# Patient Record
Sex: Male | Born: 1945
Health system: Southern US, Community
[De-identification: ages and names within clinical notes are randomized; demographics above are authoritative.]

## PROBLEM LIST (undated history)

## (undated) DIAGNOSIS — I209 Angina pectoris, unspecified: Secondary | ICD-10-CM

## (undated) DIAGNOSIS — Z87891 Personal history of nicotine dependence: Secondary | ICD-10-CM

## (undated) DIAGNOSIS — M1990 Unspecified osteoarthritis, unspecified site: Secondary | ICD-10-CM

## (undated) DIAGNOSIS — I10 Essential (primary) hypertension: Secondary | ICD-10-CM

## (undated) DIAGNOSIS — R351 Nocturia: Secondary | ICD-10-CM

## (undated) DIAGNOSIS — E7800 Pure hypercholesterolemia, unspecified: Secondary | ICD-10-CM

## (undated) DIAGNOSIS — C801 Malignant (primary) neoplasm, unspecified: Secondary | ICD-10-CM

## (undated) DIAGNOSIS — R3912 Poor urinary stream: Secondary | ICD-10-CM

## (undated) DIAGNOSIS — R0600 Dyspnea, unspecified: Secondary | ICD-10-CM

## (undated) DIAGNOSIS — D696 Thrombocytopenia, unspecified: Secondary | ICD-10-CM

## (undated) DIAGNOSIS — I2510 Atherosclerotic heart disease of native coronary artery without angina pectoris: Secondary | ICD-10-CM

## (undated) DIAGNOSIS — I219 Acute myocardial infarction, unspecified: Secondary | ICD-10-CM

## (undated) DIAGNOSIS — I350 Nonrheumatic aortic (valve) stenosis: Secondary | ICD-10-CM

## (undated) HISTORY — PX: NO PAST SURGERIES: SHX2092

## (undated) HISTORY — DX: Thrombocytopenia, unspecified: D69.6

## (undated) HISTORY — DX: Personal history of nicotine dependence: Z87.891

## (undated) HISTORY — DX: Atherosclerotic heart disease of native coronary artery without angina pectoris: I25.10

## (undated) HISTORY — DX: Nocturia: R35.1

## (undated) HISTORY — DX: Nonrheumatic aortic (valve) stenosis: I35.0

## (undated) HISTORY — DX: Poor urinary stream: R39.12

## (undated) HISTORY — DX: Essential (primary) hypertension: I10

---

## 2011-09-10 ENCOUNTER — Telehealth: Payer: Self-pay

## 2011-09-10 NOTE — Telephone Encounter (Signed)
Called, many rings and no answer.

## 2011-09-12 NOTE — Telephone Encounter (Signed)
Letter mailed for pt to call.  

## 2011-10-04 ENCOUNTER — Other Ambulatory Visit: Payer: Self-pay

## 2011-10-04 ENCOUNTER — Telehealth: Payer: Self-pay

## 2011-10-04 DIAGNOSIS — Z139 Encounter for screening, unspecified: Secondary | ICD-10-CM

## 2011-10-04 NOTE — Telephone Encounter (Addendum)
Gastroenterology Pre-Procedure Form   Request Date: 10/04/2011      Requesting Physician: Dr. Sherwood Gambler      PATIENT INFORMATION:  Jerry Roach is a 65 y.o., male (DOB=[birth year]).  PROCEDURE: Procedure(s) requested: colonoscopy Procedure Reason: screening for colon cancer  PATIENT REVIEW QUESTIONS: The patient reports the following:   1. Diabetes Melitis: no 2. Joint replacements in the past 12 months: no 3. Major health problems in the past 3 months: no 4. Has an artificial valve or MVP:no 5. Has been advised in past to take antibiotics in advance of a procedure like teeth cleaning: no}    MEDICATIONS & ALLERGIES:    Patient reports the following regarding taking any blood thinners:   Plavix? no Aspirin?no Coumadin?  no  Patient confirms/reports the following medications:  Current Outpatient Prescriptions  Medication Sig Dispense Refill  . fish oil-omega-3 fatty acids 1000 MG capsule Take 1 g by mouth daily.        . NON FORMULARY OTC sinus tablet as needed         Patient confirms/reports the following allergies:  No Known Allergies  Patient is appropriate to schedule for requested procedure(s): yes  AUTHORIZATION INFORMATION Primary Insurance:   ID #:   Group #:  Pre-Cert / Auth required:  Pre-Cert / Auth #:   Secondary Insurance:   ID #:   Group #:  Pre-Cert / Auth required:  Pre-Cert / Auth #:   No orders of the defined types were placed in this encounter.    SCHEDULE INFORMATION: Procedure has been scheduled as follows:  Date: 11/05/2011      Time: 7:30 AM  Location: [facility] Short Stay  This Gastroenterology Pre-Precedure Form is being routed to the following provider(s) for review: R. Roetta Sessions, MD    Rx and instructions mailed.

## 2011-10-05 NOTE — Telephone Encounter (Signed)
OK as is.

## 2011-10-22 ENCOUNTER — Encounter (HOSPITAL_COMMUNITY): Payer: Self-pay | Admitting: Pharmacy Technician

## 2011-11-02 MED ORDER — SODIUM CHLORIDE 0.45 % IV SOLN
Freq: Once | INTRAVENOUS | Status: AC
  Administered 2011-11-05: 07:00:00 via INTRAVENOUS

## 2011-11-05 ENCOUNTER — Encounter (HOSPITAL_COMMUNITY): Payer: Self-pay | Admitting: *Deleted

## 2011-11-05 ENCOUNTER — Other Ambulatory Visit: Payer: Self-pay | Admitting: Internal Medicine

## 2011-11-05 ENCOUNTER — Encounter (HOSPITAL_COMMUNITY)
Admission: RE | Disposition: A | Discharge status: Home / Self Care | Payer: Self-pay | Source: Ambulatory Visit | Attending: Internal Medicine

## 2011-11-05 ENCOUNTER — Ambulatory Visit (HOSPITAL_COMMUNITY)
Admission: RE | Admit: 2011-11-05 | Discharge: 2011-11-05 | Disposition: A | Discharge status: Home / Self Care | Payer: Federal, State, Local not specified - PPO | Source: Ambulatory Visit | Attending: Internal Medicine | Admitting: Internal Medicine

## 2011-11-05 DIAGNOSIS — Z1211 Encounter for screening for malignant neoplasm of colon: Secondary | ICD-10-CM

## 2011-11-05 DIAGNOSIS — D126 Benign neoplasm of colon, unspecified: Secondary | ICD-10-CM | POA: Insufficient documentation

## 2011-11-05 DIAGNOSIS — Z139 Encounter for screening, unspecified: Secondary | ICD-10-CM

## 2011-11-05 HISTORY — PX: COLONOSCOPY: SHX5424

## 2011-11-05 SURGERY — COLONOSCOPY
Anesthesia: Moderate Sedation

## 2011-11-05 MED ORDER — MEPERIDINE HCL 100 MG/ML IJ SOLN
INTRAMUSCULAR | Status: AC
  Filled 2011-11-05: qty 2

## 2011-11-05 MED ORDER — MIDAZOLAM HCL 5 MG/5ML IJ SOLN
INTRAMUSCULAR | Status: AC
  Filled 2011-11-05: qty 10

## 2011-11-05 MED ORDER — STERILE WATER FOR IRRIGATION IR SOLN
Status: DC | PRN
  Administered 2011-11-05: 08:00:00

## 2011-11-05 MED ORDER — MIDAZOLAM HCL 5 MG/5ML IJ SOLN
INTRAMUSCULAR | Status: DC | PRN
  Administered 2011-11-05: 1 mg via INTRAVENOUS
  Administered 2011-11-05: 2 mg via INTRAVENOUS

## 2011-11-05 MED ORDER — MEPERIDINE HCL 100 MG/ML IJ SOLN
INTRAMUSCULAR | Status: DC | PRN
  Administered 2011-11-05: 50 mg via INTRAVENOUS
  Administered 2011-11-05: 25 mg via INTRAVENOUS

## 2011-11-05 NOTE — H&P (Signed)
  Primary Care Physician: Dr. Sherwood Gambler Primary Gastroenterologist:  Dr. Jena Gauss  Pre-Procedure History & Physical: HPI:  Jerry Roach is a 66 y.o. male is here for a screening colonoscopy. First ever colonoscopy. No bowel symptoms. No family history of colon cancer colon polyps.  Past Medical History  Diagnosis Date  . No pertinent past medical history     Past Surgical History  Procedure Date  . No past surgeries     Prior to Admission medications   Medication Sig Start Date End Date Taking? Authorizing Provider  chlorpheniramine-pseudoephedrine-acetaminophen (SINUTAB) 2-30-500 MG per tablet Take 1 tablet by mouth every 4 (four) hours as needed. sinus   Yes Historical Provider, MD  fish oil-omega-3 fatty acids 1000 MG capsule Take 1 g by mouth daily.     Yes Historical Provider, MD    Allergies as of 10/04/2011  . (No Known Allergies)    Family History  Problem Relation Age of Onset  . Colon cancer Neg Hx     History   Social History  . Marital Status: Married    Spouse Name: N/A    Number of Children: N/A  . Years of Education: N/A   Occupational History  . Not on file.   Social History Main Topics  . Smoking status: Current Everyday Smoker -- 1.5 packs/day for 39 years  . Smokeless tobacco: Not on file  . Alcohol Use: No  . Drug Use: No  . Sexually Active:    Other Topics Concern  . Not on file   Social History Narrative  . No narrative on file    Review of Systems: See HPI, otherwise negative ROS  Physical Exam: BP 137/75  Pulse 82  Temp(Src) 97.8 F (36.6 C) (Oral)  Resp 20  Ht 5\' 11"  (1.803 m)  Wt 225 lb (102.059 kg)  BMI 31.38 kg/m2  SpO2 98% General:   Alert,  Well-developed, well-nourished, pleasant and cooperative in NAD Head:  Normocephalic and atraumatic. Eyes:  Sclera clear, no icterus.   Conjunctiva pink. Ears:  Normal auditory acuity. Nose:  No deformity, discharge,  or lesions. Mouth:  No deformity or lesions, dentition  normal. Neck:  Supple; no masses or thyromegaly. Lungs:  Clear throughout to auscultation.   No wheezes, crackles, or rhonchi. No acute distress. Heart:  Regular rate and rhythm; no murmurs, clicks, rubs,  or gallops. Abdomen:  Soft, nontender and nondistended. No masses, hepatosplenomegaly or hernias noted. Normal bowel sounds, without guarding, and without rebound.   Msk:  Symmetrical without gross deformities. Normal posture. Pulses:  Normal pulses noted. Extremities:  Without clubbing or edema. Neurologic:  Alert and  oriented x4;  grossly normal neurologically. Skin:  Intact without significant lesions or rashes. Cervical Nodes:  No significant cervical adenopathy. Psych:  Alert and cooperative. Normal mood and affect.  Impression/Plan: Jerry Roach is now here to undergo a screening colonoscopy.  First-ever average risk screening examination.  Risks, benefits, limitations, imponderables and alternatives regarding colonoscopy have been reviewed with the patient. Questions have been answered. All parties agreeable.

## 2011-11-05 NOTE — Op Note (Signed)
[facility] [address] [place], [place]  [address]  COLONOSCOPY PROCEDURE REPORT  PATIENT:  Jerry Roach, Jerry Roach  MR#:  [id number] BIRTHDATE:  1946/04/11, 65 yrs. old  GENDER:  male ENDOSCOPIST:  R. Roetta Sessions, MD FACP [facility] REF. BY:  Artis Delay, M.D. PROCEDURE DATE:  11/05/2011 PROCEDURE:  Colonoscopy with multiple snare polypectomies  INDICATIONS:  First-ever average risk screening examination  INFORMED CONSENT:  The risks, benefits, alternatives and imponderables including but not limited to bleeding, perforation as well as the possibility of a missed lesion have been reviewed. The potential for biopsy, lesion removal, etc. have also been discussed.  Questions have been answered.  All parties agreeable. Please see the history and physical in the medical record for more information.  MEDICATIONS:  Versed 3 mg IV and Demerol 75 mg IV in divided doses.  DESCRIPTION OF PROCEDURE:  After a digital rectal exam was performed, the EC-[id number] ([id number]) colonoscope was advanced from the anus through the rectum and colon to the area of the cecum, ileocecal valve and appendiceal orifice.  The cecum was deeply intubated.  These structures were well-seen and photographed for the record.  From the level of the cecum and ileocecal valve, the scope was slowly and cautiously withdrawn.  The mucosal surfaces were carefully surveyed utilizing scope tip deflection to facilitate fold flattening as needed.  The scope was pulled down into the rectum where a thorough examination including retroflexion was performed. <<PROCEDUREIMAGES>>  FINDINGS: Good preparation.  Multiple colonic polyps in the ascending, transverse and descending segments. The largest polyp being approximately 3 mm X 10 mm in the ascending segment.  Normal rectum. Normal distal 10 cm of terminal ileum.  THERAPEUTIC / DIAGNOSTIC MANEUVERS PERFORMED:  Multiple hot and cold snare polypectomies  performed.  COMPLICATIONS:  None  CECAL WITHDRAWAL TIME: 19 minutes  IMPRESSION:  Multiple colonic polyps-removed as described above  RECOMMENDATIONS:  Follow up pathology  ______________________________ R. Roetta Sessions, MD Caleen Essex  CC:  Artis Delay, M.D.  n. eSIGNED:   R. Roetta Sessions at 11/05/2011 08:13 AM  Howell Pringle, [phone]

## 2011-11-09 ENCOUNTER — Encounter: Payer: Self-pay | Admitting: Internal Medicine

## 2011-11-12 ENCOUNTER — Encounter (HOSPITAL_COMMUNITY): Payer: Self-pay | Admitting: Internal Medicine

## 2014-11-09 ENCOUNTER — Encounter: Payer: Self-pay | Admitting: Internal Medicine

## 2015-02-28 NOTE — Patient Instructions (Signed)
Your procedure is scheduled on: 03/03/2015  Report to [facility] at   80    AM.  Call this number if you have problems the morning of surgery: [phone]   Do not eat food or drink liquids :After Midnight.      Take these medicines the morning of surgery with A SIP OF WATER: none   Do not wear jewelry, make-up or nail polish.  Do not wear lotions, powders, or perfumes.   Do not shave 48 hours prior to surgery.  Do not bring valuables to the hospital.  Contacts, dentures or bridgework may not be worn into surgery.  Leave suitcase in the car. After surgery it may be brought to your room.  For patients admitted to the hospital, checkout time is 11:00 AM the day of discharge.   Patients discharged the day of surgery will not be allowed to drive home.  :     Please read over the following fact sheets that you were given: Coughing and Deep Breathing, Surgical Site Infection Prevention, Anesthesia Post-op Instructions and Care and Recovery After Surgery    Cataract A cataract is a clouding of the lens of the eye. When a lens becomes cloudy, vision is reduced based on the degree and nature of the clouding. Many cataracts reduce vision to some degree. Some cataracts make people more near-sighted as they develop. Other cataracts increase glare. Cataracts that are ignored and become worse can sometimes look white. The white color can be seen through the pupil. CAUSES   Aging. However, cataracts may occur at any age, even in newborns.   Certain drugs.   Trauma to the eye.   Certain diseases such as diabetes.   Specific eye diseases such as chronic inflammation inside the eye or a sudden attack of a rare form of glaucoma.   Inherited or acquired medical problems.  SYMPTOMS   Gradual, progressive drop in vision in the affected eye.   Severe, rapid visual loss. This most often happens when trauma is the cause.  DIAGNOSIS  To detect a cataract, an eye doctor examines the lens. Cataracts  are best diagnosed with an exam of the eyes with the pupils enlarged (dilated) by drops.  TREATMENT  For an early cataract, vision may improve by using different eyeglasses or stronger lighting. If that does not help your vision, surgery is the only effective treatment. A cataract needs to be surgically removed when vision loss interferes with your everyday activities, such as driving, reading, or watching TV. A cataract may also have to be removed if it prevents examination or treatment of another eye problem. Surgery removes the cloudy lens and usually replaces it with a substitute lens (intraocular lens, IOL).  At a time when both you and your doctor agree, the cataract will be surgically removed. If you have cataracts in both eyes, only one is usually removed at a time. This allows the operated eye to heal and be out of danger from any possible problems after surgery (such as infection or poor wound healing). In rare cases, a cataract may be doing damage to your eye. In these cases, your caregiver may advise surgical removal right away. The vast majority of people who have cataract surgery have better vision afterward. HOME CARE INSTRUCTIONS  If you are not planning surgery, you may be asked to do the following:  Use different eyeglasses.   Use stronger or brighter lighting.   Ask your eye doctor about reducing your medicine dose or  changing medicines if it is thought that a medicine caused your cataract. Changing medicines does not make the cataract go away on its own.   Become familiar with your surroundings. Poor vision can lead to injury. Avoid bumping into things on the affected side. You are at a higher risk for tripping or falling.   Exercise extreme care when driving or operating machinery.   Wear sunglasses if you are sensitive to bright light or experiencing problems with glare.  SEEK IMMEDIATE MEDICAL CARE IF:   You have a worsening or sudden vision loss.   You notice redness,  swelling, or increasing pain in the eye.   You have a fever.  Document Released: 09/24/2005 Document Revised: 09/13/2011 Document Reviewed: 05/18/2011 [facility] Patient Information 2012 [place].PATIENT INSTRUCTIONS POST-ANESTHESIA  IMMEDIATELY FOLLOWING SURGERY:  Do not drive or operate machinery for the first twenty four hours after surgery.  Do not make any important decisions for twenty four hours after surgery or while taking narcotic pain medications or sedatives.  If you develop intractable nausea and vomiting or a severe headache please notify your doctor immediately.  FOLLOW-UP:  Please make an appointment with your surgeon as instructed. You do not need to follow up with anesthesia unless specifically instructed to do so.  WOUND CARE INSTRUCTIONS (if applicable):  Keep a dry clean dressing on the anesthesia/puncture wound site if there is drainage.  Once the wound has quit draining you may leave it open to air.  Generally you should leave the bandage intact for twenty four hours unless there is drainage.  If the epidural site drains for more than 36-48 hours please call the anesthesia department.  QUESTIONS?:  Please feel free to call your physician or the hospital operator if you have any questions, and they will be happy to assist you.

## 2015-03-01 ENCOUNTER — Encounter (HOSPITAL_COMMUNITY): Payer: Self-pay

## 2015-03-01 ENCOUNTER — Other Ambulatory Visit: Payer: Self-pay

## 2015-03-01 ENCOUNTER — Encounter (HOSPITAL_COMMUNITY)
Admission: RE | Admit: 2015-03-01 | Discharge: 2015-03-01 | Disposition: A | Discharge status: Home / Self Care | Payer: Medicare Other | Source: Ambulatory Visit | Attending: Ophthalmology | Admitting: Ophthalmology

## 2015-03-01 DIAGNOSIS — H269 Unspecified cataract: Secondary | ICD-10-CM | POA: Diagnosis present

## 2015-03-01 DIAGNOSIS — F17210 Nicotine dependence, cigarettes, uncomplicated: Secondary | ICD-10-CM | POA: Diagnosis not present

## 2015-03-01 DIAGNOSIS — J449 Chronic obstructive pulmonary disease, unspecified: Secondary | ICD-10-CM | POA: Diagnosis not present

## 2015-03-01 LAB — BASIC METABOLIC PANEL
ANION GAP: 8 (ref 5–15)
BUN: 14 mg/dL (ref 6–20)
CO2: 27 mmol/L (ref 22–32)
CREATININE: 1.13 mg/dL (ref 0.61–1.24)
Calcium: 9.4 mg/dL (ref 8.9–10.3)
Chloride: 103 mmol/L (ref 101–111)
GFR calc non Af Amer: >60 mL/min (ref >60)
GLUCOSE: 112 mg/dL — AB (ref 65–99)
Potassium: 4.6 mmol/L (ref 3.5–5.1)
SODIUM: 138 mmol/L (ref 135–145)

## 2015-03-01 LAB — CBC
HEMATOCRIT: 43.8 % (ref 39.0–52.0)
Hemoglobin: 15.2 g/dL (ref 13.0–17.0)
MCH: 33 pg (ref 26.0–34.0)
MCHC: 34.7 g/dL (ref 30.0–36.0)
MCV: 95 fL (ref 78.0–100.0)
PLATELETS: 155 10*3/uL (ref 150–400)
RBC: 4.61 MIL/uL (ref 4.22–5.81)
RDW: 12.9 % (ref 11.5–15.5)
WBC: 7.7 10*3/uL (ref 4.0–10.5)

## 2015-03-01 NOTE — Pre-Procedure Instructions (Signed)
Patient given instructions with web site and code to sign up for "My Chart"  

## 2015-03-02 MED ORDER — LIDOCAINE HCL 3.5 % OP GEL
OPHTHALMIC | Status: AC
  Filled 2015-03-02: qty 1

## 2015-03-02 MED ORDER — LIDOCAINE HCL (PF) 1 % IJ SOLN
INTRAMUSCULAR | Status: AC
  Filled 2015-03-02: qty 2

## 2015-03-02 MED ORDER — TETRACAINE HCL 0.5 % OP SOLN
OPHTHALMIC | Status: AC
Start: 2015-03-02 — End: 2015-03-02
  Filled 2015-03-02: qty 2

## 2015-03-02 MED ORDER — NEOMYCIN-POLYMYXIN-DEXAMETH 3.5-10000-0.1 OP SUSP
OPHTHALMIC | Status: AC
  Filled 2015-03-02: qty 5

## 2015-03-02 MED ORDER — CYCLOPENTOLATE-PHENYLEPHRINE OP SOLN OPTIME - NO CHARGE
OPHTHALMIC | Status: AC
  Filled 2015-03-02: qty 2

## 2015-03-02 MED ORDER — PHENYLEPHRINE HCL 2.5 % OP SOLN
OPHTHALMIC | Status: AC
  Filled 2015-03-02: qty 15

## 2015-03-03 ENCOUNTER — Ambulatory Visit (HOSPITAL_COMMUNITY)
Admission: RE | Admit: 2015-03-03 | Discharge: 2015-03-03 | Disposition: A | Discharge status: Home / Self Care | Payer: Medicare Other | Source: Ambulatory Visit | Attending: Ophthalmology | Admitting: Ophthalmology

## 2015-03-03 ENCOUNTER — Ambulatory Visit (HOSPITAL_COMMUNITY): Payer: Medicare Other | Admitting: Anesthesiology

## 2015-03-03 ENCOUNTER — Encounter (HOSPITAL_COMMUNITY): Payer: Self-pay | Admitting: *Deleted

## 2015-03-03 ENCOUNTER — Encounter (HOSPITAL_COMMUNITY)
Admission: RE | Disposition: A | Discharge status: Home / Self Care | Payer: Self-pay | Source: Ambulatory Visit | Attending: Ophthalmology

## 2015-03-03 DIAGNOSIS — F17210 Nicotine dependence, cigarettes, uncomplicated: Secondary | ICD-10-CM | POA: Diagnosis not present

## 2015-03-03 DIAGNOSIS — J449 Chronic obstructive pulmonary disease, unspecified: Secondary | ICD-10-CM | POA: Diagnosis not present

## 2015-03-03 DIAGNOSIS — H269 Unspecified cataract: Secondary | ICD-10-CM | POA: Diagnosis not present

## 2015-03-03 HISTORY — PX: CATARACT EXTRACTION W/PHACO: SHX586

## 2015-03-03 SURGERY — PHACOEMULSIFICATION, CATARACT, WITH IOL INSERTION
Anesthesia: Monitor Anesthesia Care | Site: Eye | Laterality: Left

## 2015-03-03 MED ORDER — BSS IO SOLN
INTRAOCULAR | Status: DC | PRN
  Administered 2015-03-03: 15 mL

## 2015-03-03 MED ORDER — MIDAZOLAM HCL 2 MG/2ML IJ SOLN
1.0000 mg | INTRAMUSCULAR | Status: DC | PRN
  Administered 2015-03-03: 2 mg via INTRAVENOUS

## 2015-03-03 MED ORDER — FENTANYL CITRATE (PF) 100 MCG/2ML IJ SOLN
INTRAMUSCULAR | Status: AC
  Filled 2015-03-03: qty 2

## 2015-03-03 MED ORDER — LIDOCAINE HCL 3.5 % OP GEL
1.0000 "application " | Freq: Once | OPHTHALMIC | Status: AC
  Administered 2015-03-03: 1 via OPHTHALMIC

## 2015-03-03 MED ORDER — TETRACAINE HCL 0.5 % OP SOLN
1.0000 [drp] | OPHTHALMIC | Status: AC
  Administered 2015-03-03 (×3): 1 [drp] via OPHTHALMIC

## 2015-03-03 MED ORDER — CYCLOPENTOLATE-PHENYLEPHRINE 0.2-1 % OP SOLN
1.0000 [drp] | OPHTHALMIC | Status: AC
  Administered 2015-03-03 (×3): 1 [drp] via OPHTHALMIC

## 2015-03-03 MED ORDER — MIDAZOLAM HCL 2 MG/2ML IJ SOLN
INTRAMUSCULAR | Status: AC
  Filled 2015-03-03: qty 2

## 2015-03-03 MED ORDER — LACTATED RINGERS IV SOLN
INTRAVENOUS | Status: DC
  Administered 2015-03-03: 07:00:00 via INTRAVENOUS

## 2015-03-03 MED ORDER — PHENYLEPHRINE HCL 2.5 % OP SOLN
1.0000 [drp] | OPHTHALMIC | Status: AC
  Administered 2015-03-03 (×3): 1 [drp] via OPHTHALMIC

## 2015-03-03 MED ORDER — LIDOCAINE HCL (PF) 1 % IJ SOLN
INTRAMUSCULAR | Status: DC | PRN
  Administered 2015-03-03: .6 mL

## 2015-03-03 MED ORDER — SODIUM HYALURONATE 23 MG/ML IO SOLN
INTRAOCULAR | Status: DC | PRN
  Administered 2015-03-03: 0.6 mL via INTRAOCULAR

## 2015-03-03 MED ORDER — EPINEPHRINE HCL 1 MG/ML IJ SOLN
INTRAMUSCULAR | Status: AC
  Filled 2015-03-03: qty 1

## 2015-03-03 MED ORDER — POVIDONE-IODINE 5 % OP SOLN
OPHTHALMIC | Status: DC | PRN
  Administered 2015-03-03: 1 via OPHTHALMIC

## 2015-03-03 MED ORDER — EPINEPHRINE HCL 1 MG/ML IJ SOLN
INTRAOCULAR | Status: DC | PRN
  Administered 2015-03-03: 500 mL

## 2015-03-03 MED ORDER — FENTANYL CITRATE (PF) 100 MCG/2ML IJ SOLN
25.0000 ug | INTRAMUSCULAR | Status: AC
  Administered 2015-03-03 (×2): 25 ug via INTRAVENOUS

## 2015-03-03 MED ORDER — LACTATED RINGERS IV SOLN
INTRAVENOUS | Status: DC | PRN
  Administered 2015-03-03: 07:00:00 via INTRAVENOUS

## 2015-03-03 MED ORDER — NEOMYCIN-POLYMYXIN-DEXAMETH 0.1 % OP SUSP
OPHTHALMIC | Status: DC | PRN
  Administered 2015-03-03: 1 [drp] via OPHTHALMIC

## 2015-03-03 MED FILL — Neomycin-Polymyxin-Dexamethasone Ophth Susp 0.1%: OPHTHALMIC | Qty: 5 | Status: AC

## 2015-03-03 SURGICAL SUPPLY — 17 items
CLOTH BEACON ORANGE TIMEOUT ST (SAFETY) ×3 IMPLANT
EYE SHIELD UNIVERSAL CLEAR (GAUZE/BANDAGES/DRESSINGS) ×3 IMPLANT
GLOVE BIOGEL PI IND STRL 6.5 (GLOVE) IMPLANT
GLOVE BIOGEL PI IND STRL 7.0 (GLOVE) ×2 IMPLANT
GLOVE BIOGEL PI IND STRL 7.5 (GLOVE) IMPLANT
GLOVE BIOGEL PI INDICATOR 6.5 (GLOVE)
GLOVE BIOGEL PI INDICATOR 7.0 (GLOVE) ×4
GLOVE BIOGEL PI INDICATOR 7.5 (GLOVE)
GLOVE EXAM NITRILE LRG STRL (GLOVE) ×3 IMPLANT
GLOVE EXAM NITRILE MD LF STRL (GLOVE) IMPLANT
PAD ARMBOARD 7.5X6 YLW CONV (MISCELLANEOUS) ×3 IMPLANT
SIGHTPATH CAT PROC W REG LENS (Ophthalmic Related) ×3 IMPLANT
SYRINGE 1CC 25X5/8 TB ECLIPSE (MISCELLANEOUS) ×3 IMPLANT
TAPE SURG TRANSPORE 1 IN (GAUZE/BANDAGES/DRESSINGS) ×1 IMPLANT
TAPE SURGICAL TRANSPORE 1 IN (GAUZE/BANDAGES/DRESSINGS) ×2
VISCOELASTIC ADDITIONAL (OPHTHALMIC RELATED) ×3 IMPLANT
WATER STERILE IRR 250ML POUR (IV SOLUTION) ×3 IMPLANT

## 2015-03-03 NOTE — Anesthesia Preprocedure Evaluation (Addendum)
Anesthesia Evaluation  Patient identified by MRN, date of birth, ID band Patient awake    Reviewed: Allergy & Precautions, NPO status , Patient's Chart, lab work & pertinent test results  Airway Mallampati: II  TM Distance: >3 FB     Dental  (+) Poor Dentition, Chipped   Pulmonary COPD (probable)Current Smoker,  breath sounds clear to auscultation        Cardiovascular negative cardio ROS  Rhythm:Regular Rate:Normal     Neuro/Psych    GI/Hepatic negative GI ROS,   Endo/Other    Renal/GU      Musculoskeletal   Abdominal   Peds  Hematology   Anesthesia Other Findings   Reproductive/Obstetrics                            Anesthesia Physical Anesthesia Plan  ASA: II  Anesthesia Plan: MAC   Post-op Pain Management:    Induction: Intravenous  Airway Management Planned: Nasal Cannula  Additional Equipment:   Intra-op Plan:   Post-operative Plan:   Informed Consent: I have reviewed the patients History and Physical, chart, labs and discussed the procedure including the risks, benefits and alternatives for the proposed anesthesia with the patient or authorized representative who has indicated his/her understanding and acceptance.     Plan Discussed with:   Anesthesia Plan Comments:         Anesthesia Quick Evaluation

## 2015-03-03 NOTE — Discharge Instructions (Signed)

## 2015-03-03 NOTE — Transfer of Care (Signed)
Immediate Anesthesia Transfer of Care Note  Patient: Jerry Roach  Procedure(s) Performed: Procedure(s) with comments: CATARACT EXTRACTION PHACO AND INTRAOCULAR LENS PLACEMENT (IOC) (Left) - CDE 6.86  Patient Location: Short Stay  Anesthesia Type:MAC  Level of Consciousness: awake, alert , oriented and patient cooperative  Airway & Oxygen Therapy: Patient Spontanous Breathing  Post-op Assessment: Report given to RN and Post -op Vital signs reviewed and stable  Post vital signs: Reviewed and stable  Last Vitals:  Filed Vitals:   03/03/15 0730  BP: 107/64  Pulse:   Temp:   Resp: 26    Complications: No apparent anesthesia complications

## 2015-03-03 NOTE — Anesthesia Postprocedure Evaluation (Signed)
  Anesthesia Post-op Note  Patient: Jerry Roach  Procedure(s) Performed: Procedure(s) with comments: CATARACT EXTRACTION PHACO AND INTRAOCULAR LENS PLACEMENT (IOC) (Left) - CDE 6.86  Patient Location: Short Stay  Anesthesia Type:MAC  Level of Consciousness: awake, alert , oriented and patient cooperative  Airway and Oxygen Therapy: Patient Spontanous Breathing  Post-op Pain: none  Post-op Assessment: Post-op Vital signs reviewed, Patient's Cardiovascular Status Stable, Respiratory Function Stable, Patent Airway, No signs of Nausea or vomiting and Pain level controlled  Post-op Vital Signs: Reviewed and stable  Last Vitals:  Filed Vitals:   03/03/15 0730  BP: 107/64  Pulse:   Temp:   Resp: 26    Complications: No apparent anesthesia complications

## 2015-03-03 NOTE — H&P (Signed)
The H and P was reviewed, the left eye was marked, and all questions answered.  No changes to H and P.

## 2015-03-03 NOTE — Anesthesia Procedure Notes (Signed)
Procedure Name: MAC Date/Time: 03/03/2015 7:31 AM Performed by: Andree Elk, AMY A Pre-anesthesia Checklist: Patient identified, Timeout performed, Emergency Drugs available, Suction available and Patient being monitored

## 2015-03-03 NOTE — Op Note (Signed)
Date of procedure: Mar 02, 2015  Pre-operative diagnosis: 1) Visually significant cataract, Left Eye; 2) Anatomic narrow (occludable) angle, left eye  Post-operative diagnosis: Visually significant cataract, Left Eye; 2) Anatomic narrow (occludable) angle, left eye  Procedure: Removal of cataract via phacoemulsification and insertion of intra-ocular lens HOYA 250 iSert, +22.0 D into the capsular bag of the Boyd  Attending surgeon: Gerda Diss. Bellarae Lizer, MD, MA  Anesthesia: MAC, Topical   Complications: None  Estimated Blood Loss: <70m (minimal)  Specimens: None  Implants: As above  Indications:  Visually significant cataract, Left Eye  Procedure:  The patient was seen and identified in the pre-operative area. The operative eye was identified and dilated.  The operative eye was marked.  Topical 0.75% Bupivicaine was administered to the operative eye.     The patient was then to the operative suite and placed in the supine position.  A timeout was performed confirming the patient, procedure to be performed, and all other relevant information.   The patient's face was prepped and draped in the usual fashion for intra-ocular surgery.  A lid speculum was placed into the operative eye and the surgical microscope moved into place and focused.  A superotemporal paracentesis was created using a 20-gauge MVR blade.  Shugarcaine was injected into the anterior chamber.  Viscoelastic was injected into the anterior chamber.  A temporal clear-corneal main wound incision was created using a 2.463mmicrokeratome.  A continuous curvilinear capsulorrhexis was initiated using an irrigating cystitome and completed using capsulorrhexis forceps.  Hydrodissection and hydrodeliniation were performed.  Viscoelastic was injected into the anterior chamber.  A phacoemulsification handpiece and a chopper as a second instrument were used to remove the nucleus and epinucleus. The irrigation/aspiration handpiece was used to  remove any remaining cortical material.   The capsular bag was reinflated with viscoelastic, checked, and found to be intact. A  Hoya 250 intraocular lens with a +22.0D power was inserted into the capsular bag and dialed into place using a Sinskey hook.  The irrigation/aspiration handpiece was used to remove any remaining viscoelastic. Miochol was injected into the eye.  The clear corneal wound and paracentesis wounds were then hydrated and checked with Weck-Cels to be watertight.  The lid-speculum and drape was removed, and the patient's face was cleaned with a wet and dry 4x4.  Vigamox was instilled in the eye before a clear shield was taped over the eye. The patient was taken to the post-operative care unit in good condition, having tolerated the procedure well.  Post-Op Instructions: [facility] and will receive all other orders and instructions.

## 2015-03-04 ENCOUNTER — Encounter (HOSPITAL_COMMUNITY): Payer: Self-pay | Admitting: Ophthalmology

## 2015-03-29 ENCOUNTER — Other Ambulatory Visit (HOSPITAL_COMMUNITY): Payer: Medicare Other

## 2015-04-18 ENCOUNTER — Encounter (HOSPITAL_COMMUNITY): Admission: RE | Payer: Self-pay | Source: Ambulatory Visit

## 2015-04-18 ENCOUNTER — Ambulatory Visit (HOSPITAL_COMMUNITY): Admission: RE | Admit: 2015-04-18 | Payer: Medicare Other | Source: Ambulatory Visit | Admitting: Ophthalmology

## 2015-04-18 SURGERY — PHACOEMULSIFICATION, CATARACT, WITH IOL INSERTION
Anesthesia: Monitor Anesthesia Care | Laterality: Right

## 2015-08-24 NOTE — Patient Instructions (Signed)
GUTHRIE LEMME  08/24/2015     '@PREFPERIOPPHARMACY'$ @   Your procedure is scheduled on 08/31/2015.  Report to [facility] at 6:15 A.M.  Call this number if you have problems the morning of surgery:  [phone]   Remember:  Do not eat food or drink liquids after midnight.  Take these medicines the morning of surgery with A SIP OF WATER [place]   Do not wear jewelry, make-up or nail polish.  Do not wear lotions, powders, or perfumes.  You may wear deodorant.  Do not shave 48 hours prior to surgery.  Men may shave face and neck.  Do not bring valuables to the hospital.  [facility] is not responsible for any belongings or valuables.  Contacts, dentures or bridgework may not be worn into surgery.  Leave your suitcase in the car.  After surgery it may be brought to your room.  For patients admitted to the hospital, discharge time will be determined by your treatment team.  Patients discharged the day of surgery will not be allowed to drive home.    Please read over the following fact sheets that you were given. Anesthesia Post-op Instructions     PATIENT INSTRUCTIONS POST-ANESTHESIA  IMMEDIATELY FOLLOWING SURGERY:  Do not drive or operate machinery for the first twenty four hours after surgery.  Do not make any important decisions for twenty four hours after surgery or while taking narcotic pain medications or sedatives.  If you develop intractable nausea and vomiting or a severe headache please notify your doctor immediately.  FOLLOW-UP:  Please make an appointment with your surgeon as instructed. You do not need to follow up with anesthesia unless specifically instructed to do so.  WOUND CARE INSTRUCTIONS (if applicable):  Keep a dry clean dressing on the anesthesia/puncture wound site if there is drainage.  Once the wound has quit draining you may leave it open to air.  Generally you should leave the bandage intact for twenty four hours unless there is drainage.  If the  epidural site drains for more than 36-48 hours please call the anesthesia department.  QUESTIONS?:  Please feel free to call your physician or the hospital operator if you have any questions, and they will be happy to assist you.       A cataract is a clouding of the lens of the eye. When a lens becomes cloudy, vision is reduced based on the degree and nature of the clouding. Surgery may be needed to improve vision. Surgery removes the cloudy lens and usually replaces it with a substitute lens (intraocular lens, IOL). LET YOUR EYE DOCTOR KNOW ABOUT:  Allergies to food or medicine.  Medicines taken including herbs, eye drops, over-the-counter medicines, and creams.  Use of steroids (by mouth or creams).  Previous problems with anesthetics or numbing medicine.  History of bleeding problems or blood clots.  Previous surgery.  Other health problems, including diabetes and kidney problems.  Possibility of pregnancy, if this applies. RISKS AND COMPLICATIONS  Infection.  Inflammation of the eyeball (endophthalmitis) that can spread to both eyes (sympathetic ophthalmia).  Poor wound healing.  If an IOL is inserted, it can later fall out of proper position. This is very uncommon.  Clouding of the part of your eye that holds an IOL in place. This is called an "after-cataract." These are uncommon but easily treated. BEFORE THE PROCEDURE  Do not eat or drink anything except small amounts of water for 8 to 12 before your surgery, or  as directed by your caregiver.  Unless you are told otherwise, continue any eye drops you have been prescribed.  Talk to your primary caregiver about all other medicines that you take (both prescription and nonprescription). In some cases, you may need to stop or change medicines near the time of your surgery. This is most important if you are taking blood-thinning medicine.Do not stop medicines unless you are told to do so.  Arrange for someone to drive  you to and from the procedure.  Do not put contact lenses in either eye on the day of your surgery. PROCEDURE There is more than one method for safely removing a cataract. Your doctor can explain the differences and help determine which is best for you. Phacoemulsification surgery is the most common form of cataract surgery.  An injection is given behind the eye or eye drops are given to make this a painless procedure.  A small cut (incision) is made on the edge of the clear, dome-shaped surface that covers the front of the eye (cornea).  A tiny probe is painlessly inserted into the eye. This device gives off ultrasound waves that soften and break up the cloudy center of the lens. This makes it easier for the cloudy lens to be removed by suction.  An IOL may be implanted.  The normal lens of the eye is covered by a clear capsule. Part of that capsule is intentionally left in the eye to support the IOL.  Your surgeon may or may not use stitches to close the incision. There are other forms of cataract surgery that require a larger incision and stitches to close the eye. This approach is taken in cases where the doctor feels that the cataract cannot be easily removed using phacoemulsification. AFTER THE PROCEDURE  When an IOL is implanted, it does not need care. It becomes a permanent part of your eye and cannot be seen or felt.  Your doctor will schedule follow-up exams to check on your progress.  Review your other medicines with your doctor to see which can be resumed after surgery.  Use eye drops or take medicine as prescribed by your doctor.   This information is not intended to replace advice given to you by your health care provider. Make sure you discuss any questions you have with your health care provider.   Document Released: 09/13/2011 Document Revised: 10/15/2014 Document Reviewed: 09/13/2011 Elsevier Interactive Patient Education [employer].

## 2015-08-25 ENCOUNTER — Encounter (HOSPITAL_COMMUNITY): Payer: Self-pay

## 2015-08-25 ENCOUNTER — Encounter (HOSPITAL_COMMUNITY)
Admission: RE | Admit: 2015-08-25 | Discharge: 2015-08-25 | Disposition: A | Discharge status: Home / Self Care | Payer: Medicare Other | Source: Ambulatory Visit | Attending: Ophthalmology | Admitting: Ophthalmology

## 2015-08-25 DIAGNOSIS — Z01818 Encounter for other preprocedural examination: Secondary | ICD-10-CM | POA: Diagnosis present

## 2015-08-25 DIAGNOSIS — H2511 Age-related nuclear cataract, right eye: Secondary | ICD-10-CM | POA: Insufficient documentation

## 2015-08-25 LAB — CBC
HCT: 46 % (ref 39.0–52.0)
HEMOGLOBIN: 15.8 g/dL (ref 13.0–17.0)
MCH: 32.7 pg (ref 26.0–34.0)
MCHC: 34.3 g/dL (ref 30.0–36.0)
MCV: 95.2 fL (ref 78.0–100.0)
Platelets: 147 10*3/uL — ABNORMAL LOW (ref 150–400)
RBC: 4.83 MIL/uL (ref 4.22–5.81)
RDW: 12.5 % (ref 11.5–15.5)
WBC: 7.9 10*3/uL (ref 4.0–10.5)

## 2015-08-25 LAB — BASIC METABOLIC PANEL
Anion gap: 5 (ref 5–15)
BUN: 20 mg/dL (ref 6–20)
CALCIUM: 9.4 mg/dL (ref 8.9–10.3)
CO2: 27 mmol/L (ref 22–32)
CREATININE: 1.18 mg/dL (ref 0.61–1.24)
Chloride: 104 mmol/L (ref 101–111)
Glucose, Bld: 100 mg/dL — ABNORMAL HIGH (ref 65–99)
Potassium: 4.6 mmol/L (ref 3.5–5.1)
SODIUM: 136 mmol/L (ref 135–145)

## 2015-08-25 NOTE — Pre-Procedure Instructions (Signed)
Patient given information to sign up for my chart at home. 

## 2015-08-30 MED ORDER — TETRACAINE HCL 0.5 % OP SOLN
OPHTHALMIC | Status: AC
  Filled 2015-08-30: qty 2

## 2015-08-30 MED ORDER — LIDOCAINE HCL (PF) 1 % IJ SOLN
INTRAMUSCULAR | Status: AC
  Filled 2015-08-30: qty 2

## 2015-08-30 MED ORDER — CYCLOPENTOLATE-PHENYLEPHRINE OP SOLN OPTIME - NO CHARGE
OPHTHALMIC | Status: AC
  Filled 2015-08-30: qty 2

## 2015-08-30 MED ORDER — LIDOCAINE HCL 3.5 % OP GEL
OPHTHALMIC | Status: AC
  Filled 2015-08-30: qty 1

## 2015-08-30 MED ORDER — PHENYLEPHRINE HCL 2.5 % OP SOLN
OPHTHALMIC | Status: AC
  Filled 2015-08-30: qty 15

## 2015-08-30 MED ORDER — NEOMYCIN-POLYMYXIN-DEXAMETH 3.5-10000-0.1 OP SUSP
OPHTHALMIC | Status: AC
  Filled 2015-08-30: qty 5

## 2015-08-31 ENCOUNTER — Ambulatory Visit (HOSPITAL_COMMUNITY)
Admission: RE | Admit: 2015-08-31 | Discharge: 2015-08-31 | Disposition: A | Discharge status: Home / Self Care | Payer: Medicare Other | Source: Ambulatory Visit | Attending: Ophthalmology | Admitting: Ophthalmology

## 2015-08-31 ENCOUNTER — Encounter (HOSPITAL_COMMUNITY): Payer: Self-pay | Admitting: *Deleted

## 2015-08-31 ENCOUNTER — Encounter (HOSPITAL_COMMUNITY)
Admission: RE | Disposition: A | Discharge status: Home / Self Care | Payer: Self-pay | Source: Ambulatory Visit | Attending: Ophthalmology

## 2015-08-31 ENCOUNTER — Ambulatory Visit (HOSPITAL_COMMUNITY): Payer: Medicare Other | Admitting: Anesthesiology

## 2015-08-31 DIAGNOSIS — F17210 Nicotine dependence, cigarettes, uncomplicated: Secondary | ICD-10-CM | POA: Insufficient documentation

## 2015-08-31 DIAGNOSIS — H268 Other specified cataract: Secondary | ICD-10-CM | POA: Diagnosis not present

## 2015-08-31 DIAGNOSIS — H2511 Age-related nuclear cataract, right eye: Secondary | ICD-10-CM | POA: Diagnosis present

## 2015-08-31 HISTORY — PX: CATARACT EXTRACTION W/PHACO: SHX586

## 2015-08-31 SURGERY — PHACOEMULSIFICATION, CATARACT, WITH IOL INSERTION
Anesthesia: Monitor Anesthesia Care | Laterality: Right

## 2015-08-31 MED ORDER — LIDOCAINE HCL (PF) 1 % IJ SOLN
INTRAMUSCULAR | Status: DC | PRN
  Administered 2015-08-31: .5 mL

## 2015-08-31 MED ORDER — FENTANYL CITRATE (PF) 100 MCG/2ML IJ SOLN
INTRAMUSCULAR | Status: AC
  Filled 2015-08-31: qty 2

## 2015-08-31 MED ORDER — PHENYLEPHRINE HCL 2.5 % OP SOLN
1.0000 [drp] | OPHTHALMIC | Status: AC
  Administered 2015-08-31 (×3): 1 [drp] via OPHTHALMIC

## 2015-08-31 MED ORDER — FENTANYL CITRATE (PF) 100 MCG/2ML IJ SOLN
25.0000 ug | INTRAMUSCULAR | Status: AC
  Administered 2015-08-31: 25 ug via INTRAVENOUS

## 2015-08-31 MED ORDER — LACTATED RINGERS IV SOLN
INTRAVENOUS | Status: DC
  Administered 2015-08-31: 100 mL via INTRAVENOUS

## 2015-08-31 MED ORDER — LIDOCAINE HCL 3.5 % OP GEL
OPHTHALMIC | Status: DC | PRN
  Administered 2015-08-31: 1 via OPHTHALMIC

## 2015-08-31 MED ORDER — MIDAZOLAM HCL 2 MG/2ML IJ SOLN
INTRAMUSCULAR | Status: AC
  Filled 2015-08-31: qty 2

## 2015-08-31 MED ORDER — CYCLOPENTOLATE-PHENYLEPHRINE 0.2-1 % OP SOLN
1.0000 [drp] | OPHTHALMIC | Status: AC
  Administered 2015-08-31 (×3): 1 [drp] via OPHTHALMIC

## 2015-08-31 MED ORDER — POVIDONE-IODINE 5 % OP SOLN
OPHTHALMIC | Status: DC | PRN
  Administered 2015-08-31: 1 via OPHTHALMIC

## 2015-08-31 MED ORDER — BSS IO SOLN
INTRAOCULAR | Status: DC | PRN
  Administered 2015-08-31: 15 mL

## 2015-08-31 MED ORDER — EPINEPHRINE HCL 1 MG/ML IJ SOLN
INTRAMUSCULAR | Status: AC
  Filled 2015-08-31: qty 1

## 2015-08-31 MED ORDER — NEOMYCIN-POLYMYXIN-DEXAMETH 3.5-10000-0.1 OP SUSP
OPHTHALMIC | Status: DC | PRN
  Administered 2015-08-31: 2 [drp] via OPHTHALMIC

## 2015-08-31 MED ORDER — MIDAZOLAM HCL 2 MG/2ML IJ SOLN
1.0000 mg | INTRAMUSCULAR | Status: DC | PRN
  Administered 2015-08-31: 2 mg via INTRAVENOUS

## 2015-08-31 MED ORDER — SODIUM HYALURONATE 23 MG/ML IO SOLN
INTRAOCULAR | Status: DC | PRN
  Administered 2015-08-31: 0.6 mL via INTRAOCULAR

## 2015-08-31 MED ORDER — PROVISC 10 MG/ML IO SOLN
INTRAOCULAR | Status: DC | PRN
  Administered 2015-08-31: .85 mL via INTRAOCULAR

## 2015-08-31 MED ORDER — TETRACAINE HCL 0.5 % OP SOLN
1.0000 [drp] | OPHTHALMIC | Status: AC
  Administered 2015-08-31 (×3): 1 [drp] via OPHTHALMIC

## 2015-08-31 MED ORDER — EPINEPHRINE HCL 1 MG/ML IJ SOLN
INTRAOCULAR | Status: DC | PRN
  Administered 2015-08-31: 500 mL

## 2015-08-31 MED ORDER — LIDOCAINE HCL 3.5 % OP GEL
1.0000 | Freq: Once | OPHTHALMIC | Status: AC
Start: 2015-08-31 — End: 2015-08-31
  Administered 2015-08-31: 1 via OPHTHALMIC

## 2015-08-31 SURGICAL SUPPLY — 12 items
CLOTH BEACON ORANGE TIMEOUT ST (SAFETY) ×3 IMPLANT
EYE SHIELD UNIVERSAL CLEAR (GAUZE/BANDAGES/DRESSINGS) ×3 IMPLANT
GLOVE BIOGEL PI IND STRL 6.5 (GLOVE) ×1 IMPLANT
GLOVE BIOGEL PI INDICATOR 6.5 (GLOVE) ×2
GLOVE EXAM NITRILE MD LF STRL (GLOVE) ×3 IMPLANT
PAD ARMBOARD 7.5X6 YLW CONV (MISCELLANEOUS) ×3 IMPLANT
SIGHTPATH CAT PROC W REG LENS (Ophthalmic Related) ×3 IMPLANT
SYR TB 1ML LL NO SAFETY (SYRINGE) ×3 IMPLANT
TAPE SURG TRANSPORE 1 IN (GAUZE/BANDAGES/DRESSINGS) ×1 IMPLANT
TAPE SURGICAL TRANSPORE 1 IN (GAUZE/BANDAGES/DRESSINGS) ×2
VISCOELASTIC ADDITIONAL (OPHTHALMIC RELATED) ×3 IMPLANT
WATER STERILE IRR 250ML POUR (IV SOLUTION) ×3 IMPLANT

## 2015-08-31 NOTE — Op Note (Signed)
Date of procedure: August 31, 2015  Pre-operative diagnosis: Visually significant cataract, Right Eye  Post-operative diagnosis: Visually significant cataract, Right Eye  Procedure: Removal of cataract via phacoemulsification and insertion of intra-ocular lens Hoya 250 +22.5D into the capsular bag of the Right Eye  Attending surgeon: Gerda Diss. Kandra Graven, MD, MA  Anesthesia: MAC, Topical, Intracameral  Complications: None  Estimated Blood Loss: <50m (minimal)  Specimens: None  Implants: As above  Indications:  Visually significant cataract, Right Eye  Procedure:  The patient was seen and identified in the pre-operative area. The operative eye was identified and dilated.  The operative eye was marked.  Topical anesthesia was administered to the operative eye.     The patient was then to the operative suite and placed in the supine position.  A timeout was performed confirming the patient, procedure to be performed, and all other relevant information.   The patient's face was prepped and draped in the usual fashion for intra-ocular surgery.  A lid speculum was placed into the operative eye and the surgical microscope moved into place and focused.  A superotemporal paracentesis was created using a 15 degree blade.  Lidocaine was injected into the anterior chamber.  Viscoelastic was injected into the anterior chamber.  A temporal clear-corneal main wound incision was created using a 2.437mmicrokeratome.  A continuous curvilinear capsulorrhexis was initiated using an irrigating cystitome and completed using capsulorrhexis forceps.  Hydrodissection and hydrodeliniation were performed.  Viscoelastic was injected into the anterior chamber.  A phacoemulsification handpiece and a chopper as a second instrument were used to remove the nucleus and epinucleus. The irrigation/aspiration handpiece was used to remove any remaining cortical material.   The capsular bag was reinflated with viscoelastic,  checked, and found to be intact. An Hoya 250  intraocular lens with a +22.5D power was inserted into the capsular bag and dialed into place using a Sinskey hook.  The irrigation/aspiration handpiece was used to remove any remaining viscoelastic. Miochol was injected into the eye.  The clear corneal wound and paracentesis wounds were then hydrated and checked with Weck-Cels to be watertight.  The lid-speculum and drape was removed, and the patient's face was cleaned with a wet and dry 4x4.  Vigamox was instilled in the eye before a clear shield was taped over the eye. The patient was taken to the post-operative care unit in good condition, having tolerated the procedure well.  Post-Op Instructions: [facility] and will receive all other orders and instructions.

## 2015-08-31 NOTE — Discharge Instructions (Signed)
Monitored Anesthesia Care Monitored anesthesia care is an anesthesia service for a medical procedure. Anesthesia is the loss of the ability to feel pain. It is produced by medicines called anesthetics. It may affect a small area of your body (local anesthesia), a large area of your body (regional anesthesia), or your entire body (general anesthesia). The need for monitored anesthesia care depends your procedure, your condition, and the potential need for regional or general anesthesia. It is often provided during procedures where:   General anesthesia may be needed if there are complications. This is because you need special care when you are under general anesthesia.   You will be under local or regional anesthesia. This is so that you are able to have higher levels of anesthesia if needed.   You will receive calming medicines (sedatives). This is especially the case if sedatives are given to put you in a semi-conscious state of relaxation (deep sedation). This is because the amount of sedative needed to produce this state can be hard to predict. Too much of a sedative can produce general anesthesia. Monitored anesthesia care is performed by one or more health care providers who have special training in all types of anesthesia. You will need to meet with these health care providers before your procedure. During this meeting, they will ask you about your medical history. They will also give you instructions to follow. (For example, you will need to stop eating and drinking before your procedure. You may also need to stop or change medicines you are taking.) During your procedure, your health care providers will stay with you. They will:   Watch your condition. This includes watching your blood pressure, breathing, and level of pain.   Diagnose and treat problems that occur.   Give medicines if they are needed. These may include calming medicines (sedatives) and anesthetics.   Make sure you are  comfortable.  Having monitored anesthesia care does not necessarily mean that you will be under anesthesia. It does mean that your health care providers will be able to manage anesthesia if you need it or if it occurs. It also means that you will be able to have a different type of anesthesia than you are having if you need it. When your procedure is complete, your health care providers will continue to watch your condition. They will make sure any medicines wear off before you are allowed to go home.    This information is not intended to replace advice given to you by your health care provider. Make sure you discuss any questions you have with your health care provider.   Document Released: 06/20/2005 Document Revised: 10/15/2014 Document Reviewed: 11/05/2012 Elsevier Interactive Patient Education [employer].

## 2015-08-31 NOTE — H&P (Signed)
The H and P was reviewed and updated.  No changes were found after exam.  The surgical eye was marked.

## 2015-08-31 NOTE — Anesthesia Procedure Notes (Signed)
Procedure Name: MAC Date/Time: 08/31/2015 7:33 AM Performed by: Vista Deck Pre-anesthesia Checklist: Patient identified, Emergency Drugs available, Suction available, Timeout performed and Patient being monitored Patient Re-evaluated:Patient Re-evaluated prior to inductionOxygen Delivery Method: Nasal Cannula

## 2015-08-31 NOTE — Transfer of Care (Signed)
Immediate Anesthesia Transfer of Care Note  Patient: Jerry Roach  Procedure(s) Performed: Procedure(s) (LRB): CATARACT EXTRACTION PHACO AND INTRAOCULAR LENS PLACEMENT RIGHT EYE CDE=4.08 (Right)  Patient Location: Shortstay  Anesthesia Type: MAC  Level of Consciousness: awake  Airway & Oxygen Therapy: Patient Spontanous Breathing   Post-op Assessment: Report given to PACU RN, Post -op Vital signs reviewed and stable and Patient moving all extremities  Post vital signs: Reviewed and stable  Complications: No apparent anesthesia complications

## 2015-08-31 NOTE — Anesthesia Preprocedure Evaluation (Signed)
Anesthesia Evaluation  Patient identified by MRN, date of birth, ID band Patient awake    Reviewed: Allergy & Precautions, NPO status , Patient's Chart, lab work & pertinent test results  Airway Mallampati: II  TM Distance: >3 FB     Dental  (+) Poor Dentition, Chipped   Pulmonary COPD (probable), Current Smoker,    breath sounds clear to auscultation       Cardiovascular negative cardio ROS   Rhythm:Regular Rate:Normal     Neuro/Psych    GI/Hepatic negative GI ROS,   Endo/Other    Renal/GU      Musculoskeletal   Abdominal   Peds  Hematology   Anesthesia Other Findings   Reproductive/Obstetrics                             Anesthesia Physical Anesthesia Plan  ASA: II  Anesthesia Plan: MAC   Post-op Pain Management:    Induction: Intravenous  Airway Management Planned: Nasal Cannula  Additional Equipment:   Intra-op Plan:   Post-operative Plan:   Informed Consent: I have reviewed the patients History and Physical, chart, labs and discussed the procedure including the risks, benefits and alternatives for the proposed anesthesia with the patient or authorized representative who has indicated his/her understanding and acceptance.     Plan Discussed with:   Anesthesia Plan Comments:         Anesthesia Quick Evaluation

## 2015-08-31 NOTE — Anesthesia Postprocedure Evaluation (Signed)
Anesthesia Post Note  Patient: Jerry Roach  Procedure(s) Performed: Procedure(s) (LRB): CATARACT EXTRACTION PHACO AND INTRAOCULAR LENS PLACEMENT RIGHT EYE CDE=4.08 (Right)  Patient location during evaluation: Short Stay Anesthesia Type: MAC Level of consciousness: awake and alert Pain management: pain level controlled Vital Signs Assessment: post-procedure vital signs reviewed and stable Respiratory status: spontaneous breathing Anesthetic complications: no    Last Vitals:  Filed Vitals:   08/31/15 0725 08/31/15 0730  BP: 119/69 126/70  Pulse:    Temp:    Resp: 21 27    Last Pain: There were no vitals filed for this visit.               Drucie Opitz

## 2016-04-26 DIAGNOSIS — C44229 Squamous cell carcinoma of skin of left ear and external auricular canal: Secondary | ICD-10-CM | POA: Diagnosis not present

## 2016-04-26 DIAGNOSIS — X32XXXA Exposure to sunlight, initial encounter: Secondary | ICD-10-CM | POA: Diagnosis not present

## 2016-04-26 DIAGNOSIS — D225 Melanocytic nevi of trunk: Secondary | ICD-10-CM | POA: Diagnosis not present

## 2016-04-26 DIAGNOSIS — L570 Actinic keratosis: Secondary | ICD-10-CM | POA: Diagnosis not present

## 2016-05-28 DIAGNOSIS — L570 Actinic keratosis: Secondary | ICD-10-CM | POA: Diagnosis not present

## 2016-05-28 DIAGNOSIS — Z85828 Personal history of other malignant neoplasm of skin: Secondary | ICD-10-CM | POA: Diagnosis not present

## 2016-05-28 DIAGNOSIS — C44311 Basal cell carcinoma of skin of nose: Secondary | ICD-10-CM | POA: Diagnosis not present

## 2016-05-28 DIAGNOSIS — Z08 Encounter for follow-up examination after completed treatment for malignant neoplasm: Secondary | ICD-10-CM | POA: Diagnosis not present

## 2016-05-28 DIAGNOSIS — X32XXXD Exposure to sunlight, subsequent encounter: Secondary | ICD-10-CM | POA: Diagnosis not present

## 2016-09-24 DIAGNOSIS — H5053 Vertical heterophoria: Secondary | ICD-10-CM | POA: Diagnosis not present

## 2016-09-24 DIAGNOSIS — H401131 Primary open-angle glaucoma, bilateral, mild stage: Secondary | ICD-10-CM | POA: Diagnosis not present

## 2017-01-15 DIAGNOSIS — H40013 Open angle with borderline findings, low risk, bilateral: Secondary | ICD-10-CM | POA: Diagnosis not present

## 2017-01-15 DIAGNOSIS — H40003 Preglaucoma, unspecified, bilateral: Secondary | ICD-10-CM | POA: Diagnosis not present

## 2017-01-15 DIAGNOSIS — H40053 Ocular hypertension, bilateral: Secondary | ICD-10-CM | POA: Diagnosis not present

## 2017-08-14 DIAGNOSIS — E663 Overweight: Secondary | ICD-10-CM | POA: Diagnosis not present

## 2017-08-14 DIAGNOSIS — R3911 Hesitancy of micturition: Secondary | ICD-10-CM | POA: Diagnosis not present

## 2017-08-14 DIAGNOSIS — Z1389 Encounter for screening for other disorder: Secondary | ICD-10-CM | POA: Diagnosis not present

## 2017-08-14 DIAGNOSIS — Z6829 Body mass index (BMI) 29.0-29.9, adult: Secondary | ICD-10-CM | POA: Diagnosis not present

## 2017-08-14 DIAGNOSIS — Z0000 Encounter for general adult medical examination without abnormal findings: Secondary | ICD-10-CM | POA: Diagnosis not present

## 2017-08-19 DIAGNOSIS — H1030 Unspecified acute conjunctivitis, unspecified eye: Secondary | ICD-10-CM | POA: Diagnosis not present

## 2017-08-19 DIAGNOSIS — J209 Acute bronchitis, unspecified: Secondary | ICD-10-CM | POA: Diagnosis not present

## 2017-08-22 DIAGNOSIS — L570 Actinic keratosis: Secondary | ICD-10-CM | POA: Diagnosis not present

## 2017-08-22 DIAGNOSIS — Z85828 Personal history of other malignant neoplasm of skin: Secondary | ICD-10-CM | POA: Diagnosis not present

## 2017-08-22 DIAGNOSIS — X32XXXD Exposure to sunlight, subsequent encounter: Secondary | ICD-10-CM | POA: Diagnosis not present

## 2017-08-22 DIAGNOSIS — D0439 Carcinoma in situ of skin of other parts of face: Secondary | ICD-10-CM | POA: Diagnosis not present

## 2017-08-22 DIAGNOSIS — Z08 Encounter for follow-up examination after completed treatment for malignant neoplasm: Secondary | ICD-10-CM | POA: Diagnosis not present

## 2017-10-03 DIAGNOSIS — Z08 Encounter for follow-up examination after completed treatment for malignant neoplasm: Secondary | ICD-10-CM | POA: Diagnosis not present

## 2017-10-03 DIAGNOSIS — Z85828 Personal history of other malignant neoplasm of skin: Secondary | ICD-10-CM | POA: Diagnosis not present

## 2017-10-30 ENCOUNTER — Ambulatory Visit (INDEPENDENT_AMBULATORY_CARE_PROVIDER_SITE_OTHER): Payer: Medicare Other | Admitting: Urology

## 2017-10-30 DIAGNOSIS — R3915 Urgency of urination: Secondary | ICD-10-CM

## 2017-10-30 DIAGNOSIS — R351 Nocturia: Secondary | ICD-10-CM

## 2017-12-11 ENCOUNTER — Ambulatory Visit (INDEPENDENT_AMBULATORY_CARE_PROVIDER_SITE_OTHER): Payer: Medicare Other | Admitting: Urology

## 2017-12-11 DIAGNOSIS — R3912 Poor urinary stream: Secondary | ICD-10-CM | POA: Diagnosis not present

## 2017-12-11 DIAGNOSIS — R351 Nocturia: Secondary | ICD-10-CM

## 2017-12-26 ENCOUNTER — Inpatient Hospital Stay (HOSPITAL_COMMUNITY)
Admission: EM | Admit: 2017-12-26 | Discharge: 2017-12-29 | DRG: 247 | Disposition: A | Discharge status: Home / Self Care | Payer: Medicare Other | Attending: Cardiology | Admitting: Cardiology

## 2017-12-26 ENCOUNTER — Emergency Department (HOSPITAL_COMMUNITY): Payer: Medicare Other

## 2017-12-26 ENCOUNTER — Other Ambulatory Visit: Payer: Self-pay

## 2017-12-26 ENCOUNTER — Encounter (HOSPITAL_COMMUNITY): Payer: Self-pay | Admitting: Emergency Medicine

## 2017-12-26 DIAGNOSIS — N400 Enlarged prostate without lower urinary tract symptoms: Secondary | ICD-10-CM | POA: Diagnosis present

## 2017-12-26 DIAGNOSIS — F17210 Nicotine dependence, cigarettes, uncomplicated: Secondary | ICD-10-CM | POA: Diagnosis present

## 2017-12-26 DIAGNOSIS — R001 Bradycardia, unspecified: Secondary | ICD-10-CM | POA: Diagnosis present

## 2017-12-26 DIAGNOSIS — I440 Atrioventricular block, first degree: Secondary | ICD-10-CM | POA: Diagnosis present

## 2017-12-26 DIAGNOSIS — I350 Nonrheumatic aortic (valve) stenosis: Secondary | ICD-10-CM | POA: Diagnosis not present

## 2017-12-26 DIAGNOSIS — I959 Hypotension, unspecified: Secondary | ICD-10-CM | POA: Diagnosis present

## 2017-12-26 DIAGNOSIS — R0989 Other specified symptoms and signs involving the circulatory and respiratory systems: Secondary | ICD-10-CM | POA: Diagnosis present

## 2017-12-26 DIAGNOSIS — N182 Chronic kidney disease, stage 2 (mild): Secondary | ICD-10-CM | POA: Diagnosis present

## 2017-12-26 DIAGNOSIS — I351 Nonrheumatic aortic (valve) insufficiency: Secondary | ICD-10-CM | POA: Diagnosis not present

## 2017-12-26 DIAGNOSIS — R011 Cardiac murmur, unspecified: Secondary | ICD-10-CM | POA: Diagnosis not present

## 2017-12-26 DIAGNOSIS — E7800 Pure hypercholesterolemia, unspecified: Secondary | ICD-10-CM | POA: Diagnosis not present

## 2017-12-26 DIAGNOSIS — I2510 Atherosclerotic heart disease of native coronary artery without angina pectoris: Secondary | ICD-10-CM | POA: Diagnosis not present

## 2017-12-26 DIAGNOSIS — E785 Hyperlipidemia, unspecified: Secondary | ICD-10-CM | POA: Diagnosis not present

## 2017-12-26 DIAGNOSIS — I214 Non-ST elevation (NSTEMI) myocardial infarction: Secondary | ICD-10-CM | POA: Diagnosis not present

## 2017-12-26 DIAGNOSIS — R05 Cough: Secondary | ICD-10-CM | POA: Diagnosis not present

## 2017-12-26 DIAGNOSIS — Z720 Tobacco use: Secondary | ICD-10-CM | POA: Diagnosis present

## 2017-12-26 DIAGNOSIS — Z9861 Coronary angioplasty status: Secondary | ICD-10-CM

## 2017-12-26 DIAGNOSIS — R079 Chest pain, unspecified: Secondary | ICD-10-CM | POA: Diagnosis not present

## 2017-12-26 HISTORY — DX: Pure hypercholesterolemia, unspecified: E78.00

## 2017-12-26 LAB — BASIC METABOLIC PANEL
Anion gap: 10 (ref 5–15)
BUN: 23 mg/dL — AB (ref 6–20)
CO2: 26 mmol/L (ref 22–32)
Calcium: 9.4 mg/dL (ref 8.9–10.3)
Chloride: 102 mmol/L (ref 101–111)
Creatinine, Ser: 1.58 mg/dL — ABNORMAL HIGH (ref 0.61–1.24)
GFR calc Af Amer: 49 mL/min — ABNORMAL LOW (ref >60)
GFR calc non Af Amer: 43 mL/min — ABNORMAL LOW (ref >60)
Glucose, Bld: 113 mg/dL — ABNORMAL HIGH (ref 65–99)
Potassium: 3.8 mmol/L (ref 3.5–5.1)
SODIUM: 138 mmol/L (ref 135–145)

## 2017-12-26 LAB — TROPONIN I: Troponin I: 0.36 ng/mL (ref <0.03)

## 2017-12-26 LAB — CBC
HCT: 44.8 % (ref 39.0–52.0)
Hemoglobin: 15 g/dL (ref 13.0–17.0)
MCH: 31.8 pg (ref 26.0–34.0)
MCHC: 33.5 g/dL (ref 30.0–36.0)
MCV: 94.9 fL (ref 78.0–100.0)
Platelets: 148 10*3/uL — ABNORMAL LOW (ref 150–400)
RBC: 4.72 MIL/uL (ref 4.22–5.81)
RDW: 12.9 % (ref 11.5–15.5)
WBC: 7.1 10*3/uL (ref 4.0–10.5)

## 2017-12-26 LAB — APTT: APTT: 29 s (ref 24–36)

## 2017-12-26 LAB — PROTIME-INR
INR: 0.95
PROTHROMBIN TIME: 12.5 s (ref 11.4–15.2)

## 2017-12-26 IMAGING — DX DG CHEST 2V
2 series · 2 of 2 positions shown · non-contrast
Comparison: None.

CLINICAL DATA: Chest pain with productive cough

EXAM:
CHEST - 2 VIEW

[chest pa]
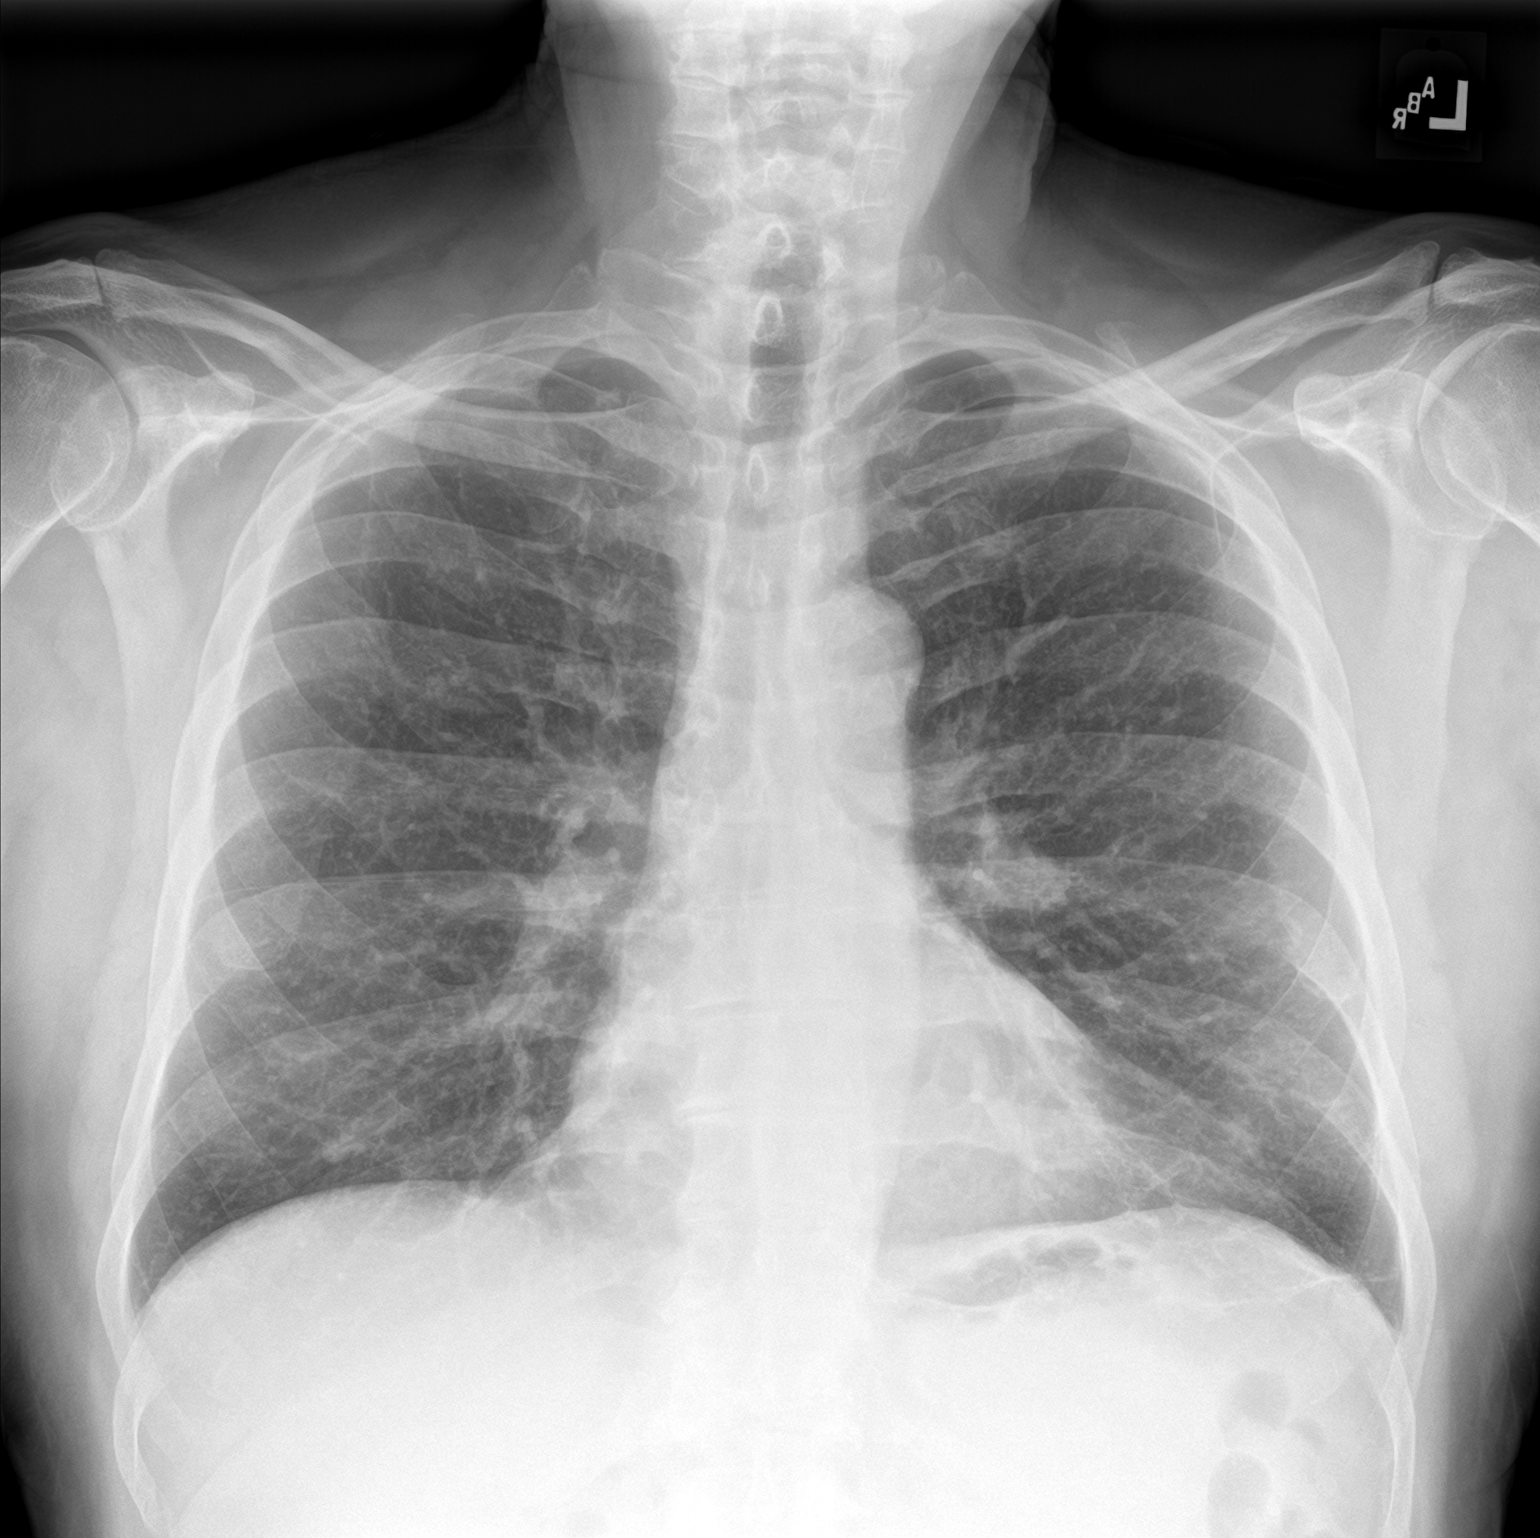

[chest lat]
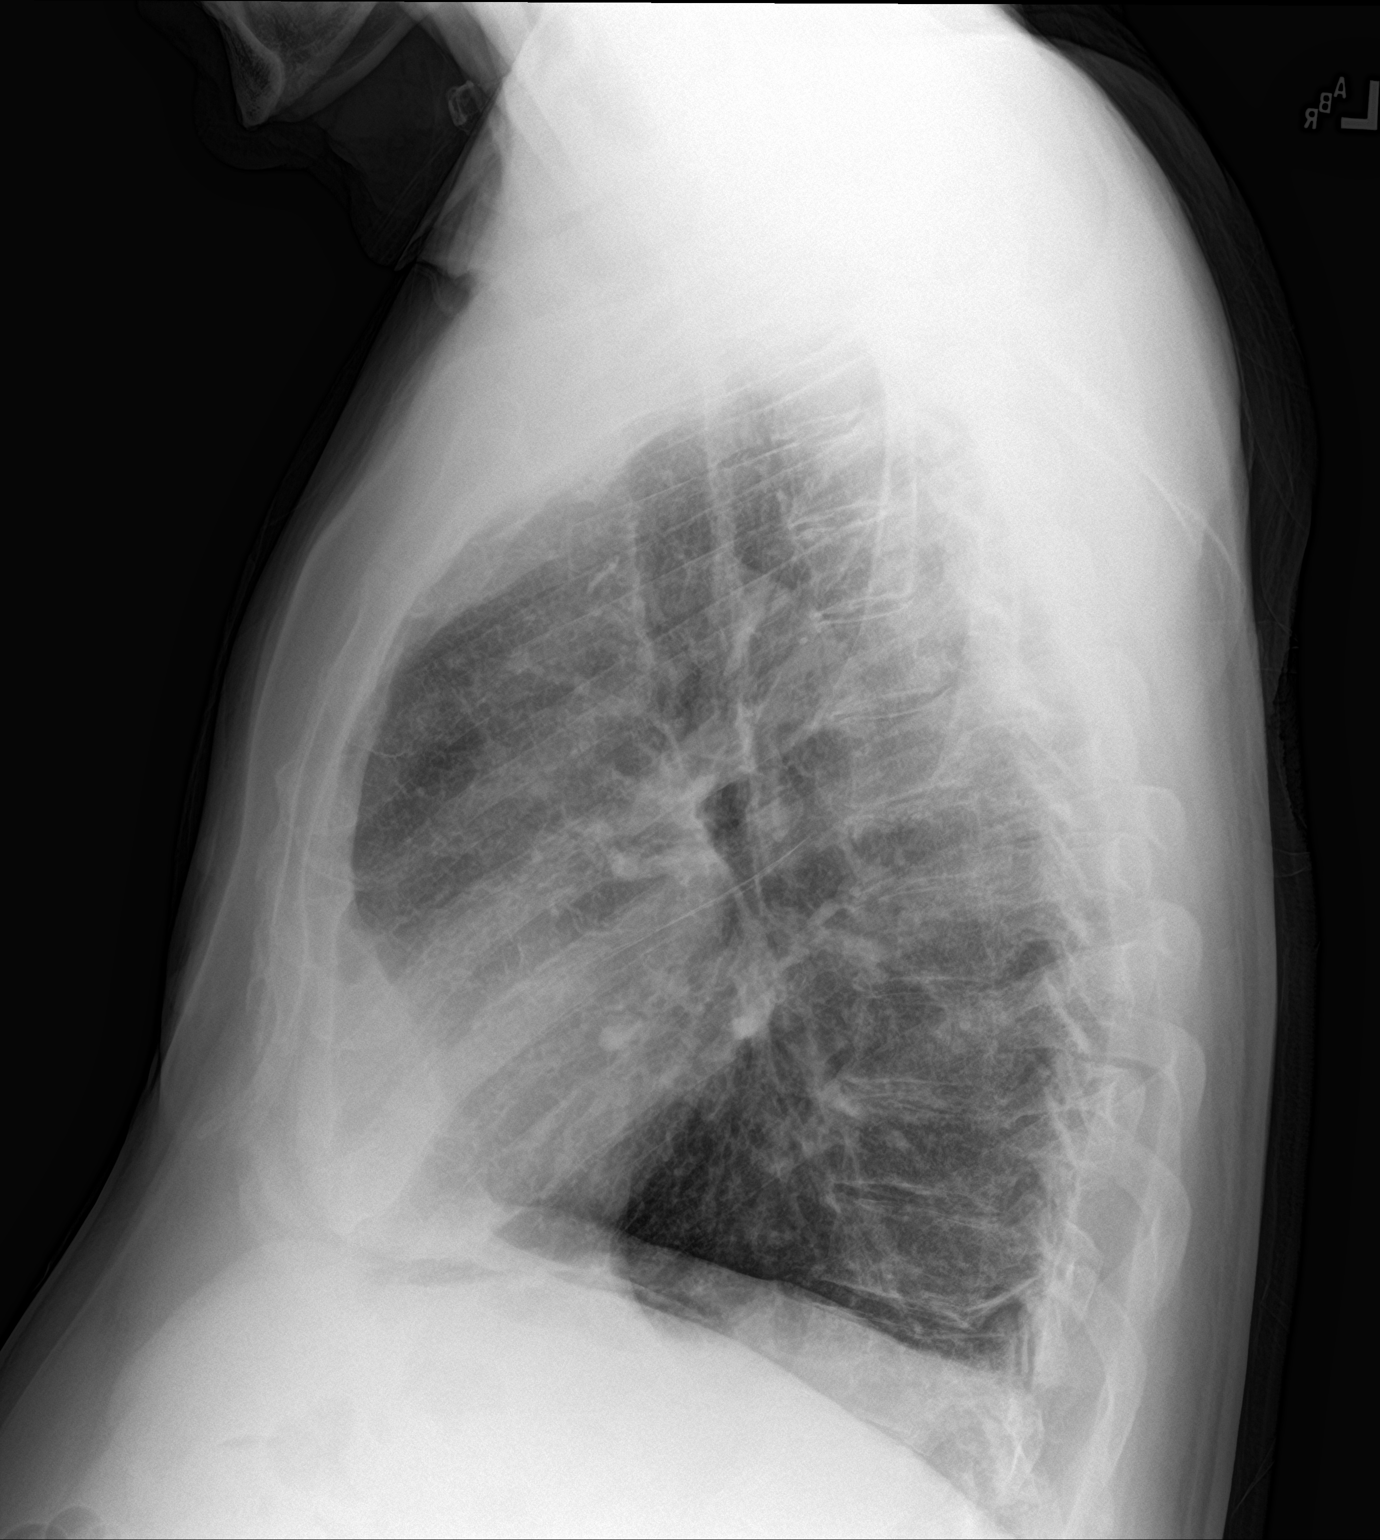

[2 of 2 positions shown; findings below may reference images not displayed]

FINDINGS: Mild diffuse bronchitic changes. No consolidation or effusion.
Normal heart size. No pneumothorax.
IMPRESSION: No active cardiopulmonary disease.  Mild diffuse bronchitic changes.

## 2017-12-26 MED ORDER — ATORVASTATIN CALCIUM 80 MG PO TABS
80.0000 mg | ORAL_TABLET | Freq: Every day | ORAL | Status: DC
  Administered 2017-12-26 – 2017-12-28 (×3): 80 mg via ORAL
  Filled 2017-12-26 (×3): qty 1

## 2017-12-26 MED ORDER — NITROGLYCERIN 0.4 MG SL SUBL
0.4000 mg | SUBLINGUAL_TABLET | SUBLINGUAL | Status: DC | PRN
  Administered 2017-12-26 – 2017-12-27 (×3): 0.4 mg via SUBLINGUAL
  Filled 2017-12-26 (×3): qty 1

## 2017-12-26 MED ORDER — ASPIRIN EC 81 MG PO TBEC
81.0000 mg | DELAYED_RELEASE_TABLET | Freq: Every day | ORAL | Status: DC
  Administered 2017-12-28 – 2017-12-29 (×2): 81 mg via ORAL
  Filled 2017-12-26 (×2): qty 1

## 2017-12-26 MED ORDER — NITROGLYCERIN IN D5W 200-5 MCG/ML-% IV SOLN
5.0000 ug/min | Freq: Once | INTRAVENOUS | Status: AC
  Administered 2017-12-26: 5 ug/min via INTRAVENOUS
  Filled 2017-12-26: qty 250

## 2017-12-26 MED ORDER — ONDANSETRON HCL 4 MG/2ML IJ SOLN
4.0000 mg | Freq: Four times a day (QID) | INTRAMUSCULAR | Status: DC | PRN
  Administered 2017-12-27: 4 mg via INTRAVENOUS
  Filled 2017-12-26: qty 2

## 2017-12-26 MED ORDER — NICOTINE 21 MG/24HR TD PT24
21.0000 mg | MEDICATED_PATCH | Freq: Every day | TRANSDERMAL | Status: DC
  Filled 2017-12-26 (×2): qty 1

## 2017-12-26 MED ORDER — HEPARIN (PORCINE) IN NACL 100-0.45 UNIT/ML-% IJ SOLN
1200.0000 [IU]/h | INTRAMUSCULAR | Status: DC
  Administered 2017-12-26: 1000 [IU]/h via INTRAVENOUS
  Filled 2017-12-26: qty 250

## 2017-12-26 MED ORDER — ACETAMINOPHEN 325 MG PO TABS
650.0000 mg | ORAL_TABLET | ORAL | Status: DC | PRN
  Administered 2017-12-26 – 2017-12-27 (×2): 650 mg via ORAL
  Filled 2017-12-26 (×2): qty 2

## 2017-12-26 MED ORDER — NITROGLYCERIN 0.4 MG SL SUBL
0.4000 mg | SUBLINGUAL_TABLET | SUBLINGUAL | Status: AC | PRN
  Administered 2017-12-26 (×3): 0.4 mg via SUBLINGUAL
  Filled 2017-12-26: qty 1

## 2017-12-26 NOTE — ED Notes (Signed)
EDP at bedside updating patient and family. 

## 2017-12-26 NOTE — ED Notes (Addendum)
Rigo Letts (pt's wife)  [phone]  *Requests to be updated if there is any change is pt's disposition.

## 2017-12-26 NOTE — ED Triage Notes (Addendum)
Pt c/o of sob and chest pain starting an hour ago.  Pt took 4 baby ASA at 1545

## 2017-12-26 NOTE — ED Notes (Signed)
EDP at bedside  

## 2017-12-26 NOTE — ED Notes (Signed)
Called [facility] for transport to [facility]. 

## 2017-12-26 NOTE — ED Notes (Addendum)
Per Doctors orders, Nitro Drip is to be titrated by 48mcg/min until SBP meets parameters, and Pts Pain level at 0 on 0-10 scale. Pt states his pain is just "dull and would barely call it a 1." Pts SBP on lower end of order parameters currently. Cindy from [facility] increased Pt's Nitro Drip above its current infusion rate of 7.57mcg/min. Pt prepared for departure to [facility]. RN advised Jenny Reichmann on the titration orders for Pt and Jenny Reichmann felt it would be fine.

## 2017-12-26 NOTE — ED Notes (Addendum)
Attempted to call pt's wife with number provided. Left message.

## 2017-12-26 NOTE — H&P (Signed)
Cardiology History & Physical    Patient ID: Jerry Roach MRN: [id number], DOB: 1946-07-16 Date of Encounter: 12/26/2017, 10:46 PM Primary Physician: Redmond School, MD  Chief Complaint: Chest pain   HPI: Jerry Roach is a 72 y.o. male with history of active smoking, hyperlipidemia, who presents with chest pain.  The patient has had stuttering chest discomfort since Monday.  This afternoon the chest discomfort was severe and occurred while patient was walking in a very leisurely place.  Initially started in his central chest and then radiated to his throat and bilateral jaws.  He denied any significant associated shortness of breath, nausea/vomiting, diaphoresis, presyncope or syncope.  He presented to the [facility] ED.  Initial ECG showed possible concern for prior inferior infarct but otherwise no acute ST or T wave changes.  His initial troponin was positive at 0.36; remainder of his labs were within normal limits.  He required 3 sublingual nitroglycerin tablets and starting an IV nitroglycerin infusion to relieve his chest pain.  IV heparin drip was also started.  He was transferred to the [facility] ICU for further management.  Upon my interview, he is chest pain-free.  He has never had a history of a prior cardiac catheterization or any other significant cardiac disease previously.  Past Medical History:  Diagnosis Date  . Hypercholesteremia   . No pertinent past medical history      Surgical History:  Past Surgical History:  Procedure Laterality Date  . CATARACT EXTRACTION W/PHACO Left 03/03/2015   Procedure: CATARACT EXTRACTION PHACO AND INTRAOCULAR LENS PLACEMENT (IOC);  Surgeon: Baruch Goldmann, MD;  Location: [facility] ORS;  Service: Ophthalmology;  Laterality: Left;  CDE 6.86  . CATARACT EXTRACTION W/PHACO Right 08/31/2015   Procedure: CATARACT EXTRACTION PHACO AND INTRAOCULAR LENS PLACEMENT RIGHT EYE CDE=4.08;  Surgeon: Baruch Goldmann, MD;  Location: [facility] ORS;  Service: Ophthalmology;   Laterality: Right;  . COLONOSCOPY  11/05/2011   Procedure: COLONOSCOPY;  Surgeon: Daneil Dolin, MD;  Location: [facility] ENDO SUITE;  Service: Endoscopy;  Laterality: N/A;  7:30 AM  . NO PAST SURGERIES       Home Meds: Prior to Admission medications   Medication Sig Start Date End Date Taking? Authorizing Provider  alfuzosin (UROXATRAL) 10 MG 24 hr tablet Take 10 mg by mouth daily.  12/11/17  Yes [provider]  atorvastatin (LIPITOR) 40 MG tablet Take 40 mg by mouth daily.  12/20/17  Yes [provider]    Allergies: No Known Allergies  Social History   Socioeconomic History  . Marital status: Married    Spouse name: Not on file  . Number of children: Not on file  . Years of education: Not on file  . Highest education level: Not on file  Occupational History  . Not on file  Social Needs  . Financial resource strain: Not on file  . Food insecurity:    Worry: Not on file    Inability: Not on file  . Transportation needs:    Medical: Not on file    Non-medical: Not on file  Tobacco Use  . Smoking status: Current Every Day Smoker    Packs/day: 1.50    Years: 39.00    Pack years: 58.50    Types: Cigarettes  . Smokeless tobacco: Never Used  Substance and Sexual Activity  . Alcohol use: No  . Drug use: No  . Sexual activity: Yes    Birth control/protection: None  Lifestyle  . Physical activity:  Days per week: Not on file    Minutes per session: Not on file  . Stress: Not on file  Relationships  . Social connections:    Talks on phone: Not on file    Gets together: Not on file    Attends religious service: Not on file    Active member of club or organization: Not on file    Attends meetings of clubs or organizations: Not on file    Relationship status: Not on file  . Intimate partner violence:    Fear of current or ex partner: Not on file    Emotionally abused: Not on file    Physically abused: Not on file    Forced sexual activity: Not on file    Other Topics Concern  . Not on file  Social History Narrative  . Not on file     Family History  Problem Relation Age of Onset  . Colon cancer Neg Hx     Review of Systems: All other systems reviewed and are otherwise negative except as noted above.  Labs:   Lab Results  Component Value Date   WBC 7.1 12/26/2017   HGB 15.0 12/26/2017   HCT 44.8 12/26/2017   MCV 94.9 12/26/2017   PLT 148 (L) 12/26/2017    Recent Labs  Lab 12/26/17 1634  NA 138  K 3.8  CL 102  CO2 26  BUN 23*  CREATININE 1.58*  CALCIUM 9.4  GLUCOSE 113*   Recent Labs    12/26/17 1634  TROPONINI 0.36*   No results found for: CHOL, HDL, LDLCALC, TRIG No results found for: DDIMER  Radiology/Studies:  Dg Chest 2 View  Result Date: 12/26/2017 CLINICAL DATA:  Chest pain with productive cough EXAM: CHEST - 2 VIEW COMPARISON:  None. FINDINGS: Mild diffuse bronchitic changes. No consolidation or effusion. Normal heart size. No pneumothorax. IMPRESSION: No active cardiopulmonary disease.  Mild diffuse bronchitic changes. Electronically Signed   By: Donavan Foil M.D.   On: 12/26/2017 16:13   Wt Readings from Last 3 Encounters:  12/26/17 94.3 kg (208 lb)  08/25/15 95.3 kg (210 lb)  03/03/15 98.4 kg (217 lb)    EKG: Sinus rhythm, possible prior inferior infarct.  No acute ST or T wave changes.  Physical Exam: Blood pressure (!) 105/59, pulse 66, temperature 98.4 F (36.9 C), temperature source Oral, resp. rate 20, height 5' 10.5" (1.791 m), weight 94.3 kg (208 lb), SpO2 100 %. Body mass index is 29.42 kg/m. General: Well developed, well nourished, in no acute distress. Head: Normocephalic, atraumatic, sclera non-icteric, no xanthomas, nares are without discharge.  Neck: Negative for carotid bruits. JVD not elevated. Lungs: Clear bilaterally to auscultation without wheezes, rales, or rhonchi. Breathing is unlabored. Heart: RRR with S1 S2. No murmurs, rubs, or gallops appreciated. Abdomen: Soft,  non-tender, non-distended with normoactive bowel sounds. No hepatomegaly. No rebound/guarding. No obvious abdominal masses. Msk:  Strength and tone appear normal for age. Extremities: No clubbing or cyanosis. No edema.  Distal pedal pulses are 2+ and equal bilaterally. Neuro: Alert and oriented X 3. No focal deficit. No facial asymmetry. Moves all extremities spontaneously. Psych:  Responds to questions appropriately with a normal affect.    Assessment and Plan   72 year old active smoker with no prior cardiac history, who presents with chest pain and was found to have NSTEMI.  1.  NSTEMI: Currently chest pain-free.  Continue IV heparin drip, and titrate nitroglycerin drip to chest pain-free.  Plan for cardiac catheterization  in the morning or more urgently if he has chest pain refractory to medical management overnight.  Echocardiogram ordered.  Given borderline blood pressure present, will hold off on beta-blockade and ACE inhibition for now.  2.  Hyperlipidemia: Increase atorvastatin to 80 mg daily and check lipid panel.  3.  Active smoking: Ordered NicoDerm patch for now, could consider pharmacologic aids for smoking cessation upon discharge.    Signed, Doylene Canning, MD 12/26/2017, 10:46 PM

## 2017-12-26 NOTE — ED Provider Notes (Signed)
[facility] EMERGENCY DEPARTMENT Provider Note   CSN: [id number] Arrival date & time: 12/26/17  1554     History   Chief Complaint Chief Complaint  Patient presents with  . Chest Pain    HPI Jerry Roach is a 72 y.o. male.  HPI  72 year old male presents with chest pain.  He states that he has been having on and off milder chest pain over the past week but not every day.  Nothing seems to set it off.  Today while at rest he developed much more severe pain at around 2:30 PM.  He states is like a pressure in the middle of his chest and is having some similar pain in his anterior neck.  The pain does not radiate.  There is no back pain or abdominal pain.  He denies any diaphoresis or nausea or vomiting.  He has had a little bit of shortness of breath but is not sure if this is because of how bad the pain has gotten.  At its worst it was a 9/10 and currently it is much milder and around a 6.  He has never had any known coronary disease.  He denies hypertension or diabetes but does have a history of hypercholesterolemia and does smoke.  He carries a chronic cough but denies any significant change.  He took 4 baby aspirin prior to arrival. No leg swelling or recent travel. No exertional symptoms.  Past Medical History:  Diagnosis Date  . Hypercholesteremia   . No pertinent past medical history     Patient Active Problem List   Diagnosis Date Noted  . NSTEMI (non-ST elevated myocardial infarction) ([place]) 12/26/2017    Past Surgical History:  Procedure Laterality Date  . CATARACT EXTRACTION W/PHACO Left 03/03/2015   Procedure: CATARACT EXTRACTION PHACO AND INTRAOCULAR LENS PLACEMENT (IOC);  Surgeon: Baruch Goldmann, MD;  Location: [facility] ORS;  Service: Ophthalmology;  Laterality: Left;  CDE 6.86  . CATARACT EXTRACTION W/PHACO Right 08/31/2015   Procedure: CATARACT EXTRACTION PHACO AND INTRAOCULAR LENS PLACEMENT RIGHT EYE CDE=4.08;  Surgeon: Baruch Goldmann, MD;  Location: [facility] ORS;  Service:  Ophthalmology;  Laterality: Right;  . COLONOSCOPY  11/05/2011   Procedure: COLONOSCOPY;  Surgeon: Daneil Dolin, MD;  Location: [facility] ENDO SUITE;  Service: Endoscopy;  Laterality: N/A;  7:30 AM  . NO PAST SURGERIES         Home Medications    Prior to Admission medications   Medication Sig Start Date End Date Taking? Authorizing Provider  alfuzosin (UROXATRAL) 10 MG 24 hr tablet Take 10 mg by mouth daily.  12/11/17  Yes [provider]  atorvastatin (LIPITOR) 40 MG tablet Take 40 mg by mouth daily.  12/20/17  Yes [provider]    Family History Family History  Problem Relation Age of Onset  . Colon cancer Neg Hx     Social History Social History   Tobacco Use  . Smoking status: Current Every Day Smoker    Packs/day: 1.50    Years: 39.00    Pack years: 58.50    Types: Cigarettes  . Smokeless tobacco: Never Used  Substance Use Topics  . Alcohol use: No  . Drug use: No     Allergies   Patient has no known allergies.   Review of Systems Review of Systems  Constitutional: Negative for diaphoresis.  Respiratory: Positive for cough and shortness of breath.   Cardiovascular: Positive for chest pain. Negative for leg swelling.  Gastrointestinal: Negative for abdominal  pain, nausea and vomiting.  Musculoskeletal: Negative for back pain.  All other systems reviewed and are negative.    Physical Exam Updated Vital Signs BP (!) 104/56   Pulse 63   Temp 98 F (36.7 C) (Oral)   Resp 19   Ht 5' 10.5" (1.791 m)   Wt 94.3 kg (208 lb)   SpO2 100%   BMI 29.42 kg/m   Physical Exam  Constitutional: He is oriented to person, place, and time. He appears well-developed and well-nourished.  Non-toxic appearance. He does not appear ill. No distress.  HENT:  Head: Normocephalic and atraumatic.  Right Ear: External ear normal.  Left Ear: External ear normal.  Nose: Nose normal.  Eyes: Right eye exhibits no discharge. Left eye exhibits no discharge.  Neck:  Neck supple.  Cardiovascular: Normal rate and regular rhythm.  Murmur heard. Pulses:      Radial pulses are 2+ on the right side, and 2+ on the left side.  Pulmonary/Chest: Effort normal and breath sounds normal. He exhibits no tenderness.  Abdominal: Soft. There is no tenderness.  Musculoskeletal: He exhibits no edema.  Neurological: He is alert and oriented to person, place, and time.  Skin: Skin is warm and dry.  Nursing note and vitals reviewed.    ED Treatments / Results  Labs (all labs ordered are listed, but only abnormal results are displayed) Labs Reviewed  BASIC METABOLIC PANEL - Abnormal; Notable for the following components:      Result Value   Glucose, Bld 113 (*)    BUN 23 (*)    Creatinine, Ser 1.58 (*)    GFR calc non Af Amer 43 (*)    GFR calc Af Amer 49 (*)    All other components within normal limits  CBC - Abnormal; Notable for the following components:   Platelets 148 (*)    All other components within normal limits  TROPONIN I - Abnormal; Notable for the following components:   Troponin I 0.36 (*)    All other components within normal limits  APTT  PROTIME-INR  HEPARIN LEVEL (UNFRACTIONATED)  CBC    EKG  EKG Interpretation  Date/Time:  Thursday December 26 2017 15:59:47 EDT Ventricular Rate:  71 PR Interval:  226 QRS Duration: 108 QT Interval:  380 QTC Calculation: 412 R Axis:   19 Text Interpretation:  Sinus rhythm with 1st degree A-V block Nonspecific T wave abnormality Abnormal ECG nonspecific T waves in similar distribution to May 2016 Confirmed by Sherwood Gambler [phone]) on 12/26/2017 4:32:35 PM             EKG Interpretation  Date/Time:  Thursday December 26 2017 16:56:51 EDT Ventricular Rate:  75 PR Interval:  226 QRS Duration: 113 QT Interval:  397 QTC Calculation: 444 R Axis:   -20 Text Interpretation:  Sinus rhythm Prolonged PR interval Inferior infarct, old nonspecific ST/T changes no significant change since earlier in the day  Confirmed by Sherwood Gambler [phone]) on 12/26/2017 5:04:53 PM       EKG Interpretation  Date/Time:  Thursday December 26 2017 17:59:04 EDT Ventricular Rate:  66 PR Interval:  226 QRS Duration: 116 QT Interval:  416 QTC Calculation: 436 R Axis:   -14 Text Interpretation:  Sinus rhythm Prolonged PR interval Probable left ventricular hypertrophy Inferior infarct, old no significant change since earlier in the day Confirmed by Sherwood Gambler [phone]) on 12/26/2017 6:12:00 PM          Radiology Dg Chest 2 View  Result Date: 12/26/2017 CLINICAL DATA:  Chest pain with productive cough EXAM: CHEST - 2 VIEW COMPARISON:  None. FINDINGS: Mild diffuse bronchitic changes. No consolidation or effusion. Normal heart size. No pneumothorax. IMPRESSION: No active cardiopulmonary disease.  Mild diffuse bronchitic changes. Electronically Signed   By: Donavan Foil M.D.   On: 12/26/2017 16:13    Procedures .Critical Care Performed by: Sherwood Gambler, MD Authorized by: Sherwood Gambler, MD   Critical care provider statement:    Critical care time (minutes):  40   Critical care time was exclusive of:  Separately billable procedures and treating other patients   Critical care was necessary to treat or prevent imminent or life-threatening deterioration of the following conditions:  Cardiac failure and circulatory failure   Critical care was time spent personally by me on the following activities:  Development of treatment plan with patient or surrogate, discussions with consultants, evaluation of patient's response to treatment, examination of patient, obtaining history from patient or surrogate, ordering and performing treatments and interventions, ordering and review of laboratory studies, ordering and review of radiographic studies, pulse oximetry, re-evaluation of patient's condition and review of old charts   (including critical care time)  Medications Ordered in ED Medications  heparin ADULT infusion  100 units/mL (25000 units/237mL sodium chloride 0.45%) (1,000 Units/hr Intravenous New Bag/Given 12/26/17 1825)  nitroGLYCERIN (NITROSTAT) SL tablet 0.4 mg (0.4 mg Sublingual Given 12/26/17 1720)  nitroGLYCERIN 50 mg in dextrose 5 % 250 mL (0.2 mg/mL) infusion (25 mcg/min Intravenous Rate/Dose Change 12/26/17 1827)     Initial Impression / Assessment and Plan / ED Course  I have reviewed the triage vital signs and the nursing notes.  Pertinent labs & imaging results that were available during my care of the patient were reviewed by me and considered in my medical decision making (see chart for details).     Patient's troponin is elevated, thus his presentation is consistent with a NSTEMI.  At first nitroglycerin made his pain almost resolved but now it is coming back.  He has been placed on a heparin and nitroglycerin drip.  With the nitroglycerin drip his pain is much better and he states there is some residual soreness but essentially his pain is gone.  ECGs have been unchanging and have not had ST elevation or significant depression.  I discussed his case with cardiology, Dr. Stanford Breed, who accepts in transfer and admission to [facility] CCU.  Given the first-degree AV block, recommends against beta-blocker at this time.  Patient has remained stable with minimal to no pain in the ED while on nitroglycerin as he is being transported to [facility].  Final Clinical Impressions(s) / ED Diagnoses   Final diagnoses:  NSTEMI (non-ST elevated myocardial infarction) [facility])    ED Discharge Orders    None       Sherwood Gambler, MD 12/26/17 2028

## 2017-12-26 NOTE — ED Notes (Signed)
CRITICAL VALUE ALERT  Critical Value:  Troponin 0.36  Date & Time Notied:  12/26/2017, [place]  Provider Notified: Dr. Regenia Skeeter   Orders Received/Actions taken: see chart

## 2017-12-27 ENCOUNTER — Inpatient Hospital Stay (HOSPITAL_COMMUNITY)
Admission: EM | Disposition: A | Discharge status: Home / Self Care | Payer: Self-pay | Source: Home / Self Care | Attending: Cardiology

## 2017-12-27 ENCOUNTER — Encounter (HOSPITAL_COMMUNITY): Payer: Self-pay | Admitting: Internal Medicine

## 2017-12-27 ENCOUNTER — Inpatient Hospital Stay (HOSPITAL_COMMUNITY): Payer: Medicare Other

## 2017-12-27 DIAGNOSIS — Z720 Tobacco use: Secondary | ICD-10-CM

## 2017-12-27 DIAGNOSIS — I214 Non-ST elevation (NSTEMI) myocardial infarction: Principal | ICD-10-CM

## 2017-12-27 DIAGNOSIS — E7800 Pure hypercholesterolemia, unspecified: Secondary | ICD-10-CM

## 2017-12-27 DIAGNOSIS — I351 Nonrheumatic aortic (valve) insufficiency: Secondary | ICD-10-CM

## 2017-12-27 DIAGNOSIS — R011 Cardiac murmur, unspecified: Secondary | ICD-10-CM

## 2017-12-27 DIAGNOSIS — I350 Nonrheumatic aortic (valve) stenosis: Secondary | ICD-10-CM

## 2017-12-27 DIAGNOSIS — R0989 Other specified symptoms and signs involving the circulatory and respiratory systems: Secondary | ICD-10-CM

## 2017-12-27 DIAGNOSIS — I2510 Atherosclerotic heart disease of native coronary artery without angina pectoris: Secondary | ICD-10-CM

## 2017-12-27 DIAGNOSIS — E785 Hyperlipidemia, unspecified: Secondary | ICD-10-CM | POA: Diagnosis present

## 2017-12-27 HISTORY — PX: CORONARY STENT INTERVENTION: CATH118234

## 2017-12-27 HISTORY — PX: LEFT HEART CATH AND CORONARY ANGIOGRAPHY: CATH118249

## 2017-12-27 LAB — CBC
HCT: 42.5 % (ref 39.0–52.0)
Hemoglobin: 14.3 g/dL (ref 13.0–17.0)
MCH: 31.7 pg (ref 26.0–34.0)
MCHC: 33.6 g/dL (ref 30.0–36.0)
MCV: 94.2 fL (ref 78.0–100.0)
Platelets: 142 10*3/uL — ABNORMAL LOW (ref 150–400)
RBC: 4.51 MIL/uL (ref 4.22–5.81)
RDW: 13 % (ref 11.5–15.5)
WBC: 10.7 10*3/uL — ABNORMAL HIGH (ref 4.0–10.5)

## 2017-12-27 LAB — BASIC METABOLIC PANEL
Anion gap: 10 (ref 5–15)
BUN: 19 mg/dL (ref 6–20)
CO2: 23 mmol/L (ref 22–32)
Calcium: 9 mg/dL (ref 8.9–10.3)
Chloride: 103 mmol/L (ref 101–111)
Creatinine, Ser: 1.25 mg/dL — ABNORMAL HIGH (ref 0.61–1.24)
GFR calc Af Amer: >60 mL/min (ref >60)
GFR calc non Af Amer: 57 mL/min — ABNORMAL LOW (ref >60)
Glucose, Bld: 116 mg/dL — ABNORMAL HIGH (ref 65–99)
Potassium: 4.2 mmol/L (ref 3.5–5.1)
Sodium: 136 mmol/L (ref 135–145)

## 2017-12-27 LAB — LIPID PANEL
Cholesterol: 133 mg/dL (ref 0–200)
HDL: 45 mg/dL (ref >40)
LDL Cholesterol: 77 mg/dL (ref 0–99)
Total CHOL/HDL Ratio: 3 RATIO
Triglycerides: 55 mg/dL (ref <150)
VLDL: 11 mg/dL (ref 0–40)

## 2017-12-27 LAB — ECHOCARDIOGRAM COMPLETE
Height: 70.5 in
Weight: 3262.4 oz

## 2017-12-27 LAB — HEPARIN LEVEL (UNFRACTIONATED): Heparin Unfractionated: 0.23 IU/mL — ABNORMAL LOW (ref 0.30–0.70)

## 2017-12-27 LAB — POCT ACTIVATED CLOTTING TIME
ACTIVATED CLOTTING TIME: 307 s
Activated Clotting Time: 268 seconds

## 2017-12-27 LAB — TROPONIN I
Troponin I: 2.1 ng/mL (ref <0.03)
Troponin I: 2.13 ng/mL (ref <0.03)

## 2017-12-27 LAB — MRSA PCR SCREENING: MRSA by PCR: NEGATIVE

## 2017-12-27 SURGERY — LEFT HEART CATH AND CORONARY ANGIOGRAPHY
Anesthesia: LOCAL

## 2017-12-27 MED ORDER — TICAGRELOR 90 MG PO TABS
90.0000 mg | ORAL_TABLET | Freq: Two times a day (BID) | ORAL | Status: DC
  Administered 2017-12-27 – 2017-12-29 (×4): 90 mg via ORAL
  Filled 2017-12-27 (×4): qty 1

## 2017-12-27 MED ORDER — ASPIRIN 81 MG PO CHEW
81.0000 mg | CHEWABLE_TABLET | ORAL | Status: AC
  Administered 2017-12-27: 81 mg via ORAL
  Filled 2017-12-27: qty 1

## 2017-12-27 MED ORDER — SODIUM CHLORIDE 0.9% FLUSH
3.0000 mL | Freq: Two times a day (BID) | INTRAVENOUS | Status: DC
  Administered 2017-12-27 – 2017-12-29 (×4): 3 mL via INTRAVENOUS

## 2017-12-27 MED ORDER — HEPARIN (PORCINE) IN NACL 2-0.9 UNIT/ML-% IJ SOLN
INTRAMUSCULAR | Status: AC | PRN
  Administered 2017-12-27 (×2): 500 mL via INTRA_ARTERIAL

## 2017-12-27 MED ORDER — IOPAMIDOL (ISOVUE-370) INJECTION 76%
INTRAVENOUS | Status: DC | PRN
  Administered 2017-12-27: 210 mL via INTRA_ARTERIAL

## 2017-12-27 MED ORDER — MIDAZOLAM HCL 2 MG/2ML IJ SOLN
INTRAMUSCULAR | Status: AC
  Filled 2017-12-27: qty 2

## 2017-12-27 MED ORDER — NITROGLYCERIN 1 MG/10 ML FOR IR/CATH LAB
INTRA_ARTERIAL | Status: DC | PRN
  Administered 2017-12-27 (×2): 100 ug via INTRACORONARY

## 2017-12-27 MED ORDER — NITROGLYCERIN IN D5W 200-5 MCG/ML-% IV SOLN: 5.0000 ug/min | INTRAVENOUS | Status: DC

## 2017-12-27 MED ORDER — LIDOCAINE HCL (PF) 1 % IJ SOLN
INTRAMUSCULAR | Status: DC | PRN
  Administered 2017-12-27: 2 mL

## 2017-12-27 MED ORDER — SODIUM CHLORIDE 0.9% FLUSH: 3.0000 mL | INTRAVENOUS | Status: DC | PRN

## 2017-12-27 MED ORDER — SODIUM CHLORIDE 0.9 % WEIGHT BASED INFUSION
3.0000 mL/kg/h | INTRAVENOUS | Status: DC
  Administered 2017-12-27: 3 mL/kg/h via INTRAVENOUS

## 2017-12-27 MED ORDER — HYDRALAZINE HCL 20 MG/ML IJ SOLN: 5.0000 mg | INTRAMUSCULAR | Status: AC | PRN

## 2017-12-27 MED ORDER — HEPARIN SODIUM (PORCINE) 1000 UNIT/ML IJ SOLN
INTRAMUSCULAR | Status: AC
  Filled 2017-12-27: qty 1

## 2017-12-27 MED ORDER — VERAPAMIL HCL 2.5 MG/ML IV SOLN
INTRAVENOUS | Status: AC
  Filled 2017-12-27: qty 2

## 2017-12-27 MED ORDER — VERAPAMIL HCL 2.5 MG/ML IV SOLN
INTRAVENOUS | Status: DC | PRN
  Administered 2017-12-27: 11:00:00 via INTRA_ARTERIAL

## 2017-12-27 MED ORDER — SODIUM CHLORIDE 0.9 % IV SOLN: 250.0000 mL | INTRAVENOUS | Status: DC | PRN

## 2017-12-27 MED ORDER — SODIUM CHLORIDE 0.9% FLUSH: 3.0000 mL | Freq: Two times a day (BID) | INTRAVENOUS | Status: DC

## 2017-12-27 MED ORDER — IOPAMIDOL (ISOVUE-370) INJECTION 76%
INTRAVENOUS | Status: AC
  Filled 2017-12-27: qty 100

## 2017-12-27 MED ORDER — TICAGRELOR 90 MG PO TABS
ORAL_TABLET | ORAL | Status: AC
  Filled 2017-12-27: qty 2

## 2017-12-27 MED ORDER — MIDAZOLAM HCL 2 MG/2ML IJ SOLN
INTRAMUSCULAR | Status: DC | PRN
  Administered 2017-12-27: 0.5 mg via INTRAVENOUS

## 2017-12-27 MED ORDER — HEPARIN SODIUM (PORCINE) 1000 UNIT/ML IJ SOLN
INTRAMUSCULAR | Status: DC | PRN
  Administered 2017-12-27: 5000 [IU] via INTRAVENOUS
  Administered 2017-12-27: 3000 [IU] via INTRAVENOUS
  Administered 2017-12-27: 5000 [IU] via INTRAVENOUS

## 2017-12-27 MED ORDER — IOPAMIDOL (ISOVUE-370) INJECTION 76%
INTRAVENOUS | Status: AC
  Filled 2017-12-27: qty 50

## 2017-12-27 MED ORDER — HEPARIN SODIUM (PORCINE) 5000 UNIT/ML IJ SOLN
5000.0000 [IU] | Freq: Three times a day (TID) | INTRAMUSCULAR | Status: DC
  Administered 2017-12-28 – 2017-12-29 (×3): 5000 [IU] via SUBCUTANEOUS
  Filled 2017-12-27 (×4): qty 1

## 2017-12-27 MED ORDER — SODIUM CHLORIDE 0.9 % WEIGHT BASED INFUSION: 1.0000 mL/kg/h | INTRAVENOUS | Status: DC

## 2017-12-27 MED ORDER — LIDOCAINE HCL 1 % IJ SOLN
INTRAMUSCULAR | Status: AC
  Filled 2017-12-27: qty 20

## 2017-12-27 MED ORDER — LABETALOL HCL 5 MG/ML IV SOLN: 10.0000 mg | INTRAVENOUS | Status: AC | PRN

## 2017-12-27 MED ORDER — TICAGRELOR 90 MG PO TABS
ORAL_TABLET | ORAL | Status: DC | PRN
  Administered 2017-12-27: 180 mg via ORAL

## 2017-12-27 MED ORDER — SODIUM CHLORIDE 0.9 % IV SOLN: INTRAVENOUS | Status: AC

## 2017-12-27 MED ORDER — FENTANYL CITRATE (PF) 100 MCG/2ML IJ SOLN
INTRAMUSCULAR | Status: AC
  Filled 2017-12-27: qty 2

## 2017-12-27 MED ORDER — FENTANYL CITRATE (PF) 100 MCG/2ML IJ SOLN
INTRAMUSCULAR | Status: DC | PRN
  Administered 2017-12-27: 25 ug via INTRAVENOUS

## 2017-12-27 MED ORDER — HEPARIN (PORCINE) IN NACL 2-0.9 UNIT/ML-% IJ SOLN
INTRAMUSCULAR | Status: AC
  Filled 2017-12-27: qty 1000

## 2017-12-27 SURGICAL SUPPLY — 18 items
BALLN SAPPHIRE 2.5X15 (BALLOONS) ×2
BALLN SAPPHIRE ~~LOC~~ 3.25X12 (BALLOONS) ×2 IMPLANT
BALLOON SAPPHIRE 2.5X15 (BALLOONS) ×1 IMPLANT
BAND ZEPHYR COMPRESS 30 LONG (HEMOSTASIS) ×2 IMPLANT
CATH INFINITI 5 FR JL3.5 (CATHETERS) ×2 IMPLANT
CATH INFINITI JR4 5F (CATHETERS) ×2 IMPLANT
CATH LAUNCHER 6FR EBU3.5 (CATHETERS) ×2 IMPLANT
GUIDEWIRE INQWIRE 1.5J.035X260 (WIRE) ×1 IMPLANT
INQWIRE 1.5J .035X260CM (WIRE) ×2
KIT ENCORE 26 ADVANTAGE (KITS) ×2 IMPLANT
KIT HEART LEFT (KITS) ×2 IMPLANT
NEEDLE PERC 21GX4CM (NEEDLE) ×2 IMPLANT
PACK CARDIAC CATHETERIZATION (CUSTOM PROCEDURE TRAY) ×2 IMPLANT
SHEATH RAIN RADIAL 21G 6FR (SHEATH) ×2 IMPLANT
STENT RESOLUTE ONYX 3.0X18 (Permanent Stent) ×2 IMPLANT
TRANSDUCER W/STOPCOCK (MISCELLANEOUS) ×2 IMPLANT
TUBING CIL FLEX 10 FLL-RA (TUBING) ×2 IMPLANT
WIRE COUGAR XT STRL 190CM (WIRE) ×2 IMPLANT

## 2017-12-27 NOTE — Research (Signed)
Spoke with patietn about AEGIS study trial. Explained the study, and follow up visits. Patient and wife to review consent. Will touch base with patient in am.

## 2017-12-27 NOTE — Progress Notes (Signed)
Per insurance check for Brilinta  # 6. S/W  [facility]  @ [employer] [facility] # [phone]    BRILINTA 90 MG BID  COVER- YES  AND NONE PREFERRED  CO-PAY- 50% OF TOTAL COAST  TIER- 3 DRUG  PRIOR APPROVAL- NO   PREFERRED PHARMACY : YES : [facility], [facility], [facility] AND [facility]

## 2017-12-27 NOTE — Brief Op Note (Signed)
BRIEF CARDIAC CATHETERIZATION NOTE  DATE: 12/27/2017 TIME: 11:14 AM  PATIENT:  Jerry Roach  72 y.o. male  PRE-OPERATIVE DIAGNOSIS:  NSTEMI  POST-OPERATIVE DIAGNOSIS:  NSTEMI  PROCEDURE:  Procedure(s): LEFT HEART CATH AND CORONARY ANGIOGRAPHY (N/A) CORONARY STENT INTERVENTION (N/A)  SURGEON:  Surgeon(s) and Role:    * Charlann Wayne, Harrell Gave, MD - Primary  FINDINGS: 1. Severe 2-vessel coronary artery disease with sequential 80% proximal and 50% mid LAD stenoses as well as occluded distal RCA with left to right collaterals. 2. Moderate ostial and proximal LCx disease. 3. Moderately elevated LVOT gradient. 4. Basal/mid inferior hypokinesis with otherwise preserved LVEF.  RECOMMENDATIONS: 1. Medical management of LCx and RCA disease. 2. DAPT with ASA and ticagrelor for at least 12 months. 3. Aggressive secondary prevention. 4. Echocardiogram to better evaluate possible aortic stenosis.  Nelva Bush, MD [facility] Pager: [phone]

## 2017-12-27 NOTE — Progress Notes (Signed)
  Echocardiogram 2D Echocardiogram has been performed.  Jerry Roach F 12/27/2017, 12:56 PM

## 2017-12-27 NOTE — Progress Notes (Signed)
ANTICOAGULATION CONSULT NOTE - Follow Up Consult  Pharmacy Consult for heparin Indication: NSTEMI  No Known Allergies  Patient Measurements: Height: 5' 10.5" (179.1 cm) Weight: 208 lb (94.3 kg) IBW/kg (Calculated) : 74.15  Vital Signs: Temp: 98.6 F (37 C) (03/22 0315) Temp Source: Oral (03/22 0315) BP: 107/59 (03/22 0600) Pulse Rate: 69 (03/22 0600)  Labs: Recent Labs    12/26/17 1634 12/26/17 1822 12/27/17 0212 12/27/17 0553  HGB 15.0  --   --  14.3  HCT 44.8  --   --  42.5  PLT 148*  --   --  142*  APTT  --  29  --   --   LABPROT  --  12.5  --   --   INR  --  0.95  --   --   HEPARINUNFRC  --   --   --  0.23*  CREATININE 1.58*  --  1.25*  --   TROPONINI 0.36*  --  2.10*  2.13*  --     Estimated Creatinine Clearance: 63.9 mL/min (A) (by C-G formula based on SCr of 1.25 mg/dL (H)).   Medications:  Medications Prior to Admission  Medication Sig Dispense Refill Last Dose  . alfuzosin (UROXATRAL) 10 MG 24 hr tablet Take 10 mg by mouth daily.    12/25/2017 at Unknown time  . atorvastatin (LIPITOR) 40 MG tablet Take 40 mg by mouth daily.    12/25/2017 at Unknown time   Scheduled:  . aspirin EC  81 mg Oral Daily  . atorvastatin  80 mg Oral q1800  . nicotine  21 mg Transdermal Daily   Infusions:  . heparin 1,000 Units/hr (12/27/17 0400)  . nitroGLYCERIN 20 mcg/min (12/27/17 0612)    Assessment: 72yo male subtherapeutic on heparin with initial dosing for NSTEMI.  Goal of Therapy:  Heparin level 0.3-0.7 units/ml Monitor platelets by anticoagulation protocol: Yes   Plan:  Will increase heparin gtt by 2 units/kg/hr to 1200 units/hr and check level in 6hr.  Wynona Neat, PharmD, BCPS  12/27/2017,6:33 AM

## 2017-12-27 NOTE — Interval H&P Note (Signed)
History and Physical Interval Note:  12/27/2017 9:25 AM  Jerry Roach  has presented today for cardiac catheterization, with the diagnosis of NSTEMI. The various methods of treatment have been discussed with the patient and family. After consideration of risks, benefits and other options for treatment, the patient has consented to  Procedure(s): LEFT HEART CATH AND CORONARY ANGIOGRAPHY (N/A) as a surgical intervention .  The patient's history has been reviewed, patient examined, no change in status, stable for surgery.  I have reviewed the patient's chart and labs.  Questions were answered to the patient's satisfaction.    Cath Lab Visit (complete for each Cath Lab visit)  Clinical Evaluation Leading to the Procedure:   ACS: Yes.    Non-ACS:  N/A  Jerry Roach

## 2017-12-27 NOTE — Care Management Note (Signed)
Case Management Note Marvetta Gibbons RN, BSN Unit 4E-Case Manager-- [place] coverage [phone]  Patient Details  Name: ARTUR WINNINGHAM MRN: [id number] Date of Birth: [birth year]  Subjective/Objective:  Pt admitted with NSTEMI s/p Cath                 Action/Plan: PTA pt lived at home with wife- referral for Brilinta needs- per insurance check- pt would have to pay 50% total cost of drug (about $150) spoke with pt and wife at bedside - 30 day free card provided- and instructed pt/wife to call toll free # to speak with them regarding pt's cost. If drug remains to costly pt will need to speak with MD at f/u appointment regarding alternate drug.   Expected Discharge Date:                  Expected Discharge Plan:  Home/Self Care  In-House Referral:     Discharge planning Services  CM Consult, Medication Assistance  Post Acute Care Choice:    Choice offered to:     DME Arranged:    DME Agency:     HH Arranged:    HH Agency:     Status of Service:  In process, will continue to follow  If discussed at Long Length of Stay Meetings, dates discussed:    Discharge Disposition:   Additional Comments:  Dawayne Patricia, RN 12/27/2017, 5:04 PM

## 2017-12-27 NOTE — Progress Notes (Signed)
Pt having 8/10 chest pain despite sublingual Nitro x3 and tylenol. EKG performed. MD paged. Order to restart Nitroglycerin gtt at 20. Will follow orders and continue to monitor.

## 2017-12-27 NOTE — H&P (View-Only) (Signed)
Progress Note  Patient Name: Jerry Roach Date of Encounter: 12/27/2017  Primary Cardiologist: No primary care provider on file.   Subjective   The patient denied chest pain today following the nitro gtt initiation. He denied known PMHx. Has been a lifelong smoker and was not aware of any family cardiac history.  Inpatient Medications    Scheduled Meds: . aspirin EC  81 mg Oral Daily  . atorvastatin  80 mg Oral q1800  . nicotine  21 mg Transdermal Daily  . sodium chloride flush  3 mL Intravenous Q12H   Continuous Infusions: . sodium chloride    . sodium chloride 1 mL/kg/hr (12/27/17 0738)  . heparin 1,200 Units/hr (12/27/17 8299)  . nitroGLYCERIN 20 mcg/min (12/27/17 0612)   PRN Meds: sodium chloride, acetaminophen, nitroGLYCERIN, ondansetron (ZOFRAN) IV, sodium chloride flush   Vital Signs    Vitals:   12/27/17 0600 12/27/17 0630 12/27/17 0700 12/27/17 0749  BP: (!) 107/59 106/60 (!) 106/50   Pulse: 69 64 74   Resp: 14 18 (!) 26   Temp:    98.6 F (37 C)  TempSrc:    Oral  SpO2: 97% 95% 96%   Weight:  92.5 kg (203 lb 14.4 oz)    Height:        Intake/Output Summary (Last 24 hours) at 12/27/2017 0827 Last data filed at 12/27/2017 0501 Gross per 24 hour  Intake 180.2 ml  Output 250 ml  Net -69.8 ml   Filed Weights   12/26/17 1822 12/27/17 0630  Weight: 94.3 kg (208 lb) 92.5 kg (203 lb 14.4 oz)    Telemetry    NSR - Personally Reviewed  ECG    NSR, Mild ST elevation in leads V2-V4 on admission - Personally Reviewed  Physical Exam   GEN: No acute distress.  Resting comfortably in his bed. Alert and oriented x3.  Neck: No JVD. Carotid bruits bilaterally.   Cardiac: RRR, Grad II sys apical murmur, rubs, or gallops.  Respiratory: Clear to auscultation bilaterally. GI: Soft, nontender, non-distended  MS: No edema; No deformity. Neuro:  Nonfocal  Psych: Normal affect   Labs    Chemistry Recent Labs  Lab 12/26/17 1634 12/27/17 0212  NA 138  136  K 3.8 4.2  CL 102 103  CO2 26 23  GLUCOSE 113* 116*  BUN 23* 19  CREATININE 1.58* 1.25*  CALCIUM 9.4 9.0  GFRNONAA 43* 57*  GFRAA 49* >60  ANIONGAP 10 10     Hematology Recent Labs  Lab 12/26/17 1634 12/27/17 0553  WBC 7.1 10.7*  RBC 4.72 4.51  HGB 15.0 14.3  HCT 44.8 42.5  MCV 94.9 94.2  MCH 31.8 31.7  MCHC 33.5 33.6  RDW 12.9 13.0  PLT 148* 142*    Cardiac Enzymes Recent Labs  Lab 12/26/17 1634 12/27/17 0212  TROPONINI 0.36* 2.10*  2.13*   No results for input(s): TROPIPOC in the last 168 hours.   BNPNo results for input(s): BNP, PROBNP in the last 168 hours.   DDimer No results for input(s): DDIMER in the last 168 hours.   Radiology    Dg Chest 2 View  Result Date: 12/26/2017 CLINICAL DATA:  Chest pain with productive cough EXAM: CHEST - 2 VIEW COMPARISON:  None. FINDINGS: Mild diffuse bronchitic changes. No consolidation or effusion. Normal heart size. No pneumothorax. IMPRESSION: No active cardiopulmonary disease.  Mild diffuse bronchitic changes. Electronically Signed   By: Donavan Foil M.D.   On: 12/26/2017 16:13  Cardiac Studies   N/A  Patient Profile     72 y.o. male with HLD, active smoker who presented with chest pain x4 days that worsened to the extent that he desired evaluation. He stated that the pain occurred while walking at a slow pace, was centrally located with radiation into his throat and bilateral jaws. At the time of admission he denied associated nausea, vomiting, diaphoresis, presyncope, syncope, dyspnea, pain in his arms or ever having had that pain before. EKG indicated possible mild changes concerning for a potentia inferior infarct but no overt ST elevation. Troponin was positive at 0.36 and increased to 2.13 before decreasing to 2.10. The chest pain continued despite three sublingual NTG tablets until he was started on the Nitro gtt. IV heparin was ordered and he was placed in the [facility] ICU in preparation for cardiac  cath the following morning.   Assessment & Plan    NSTEMI:  Chest pain resolved with NTG gtt. IV heparin drip continued. Cardiac cath planned for this am. Holding BB and ACEi a this time due to soft BP.  Scheduled for cardiac cath. Troponin enzymes peaked. Echocardiogram ordered post cath Continue ASA Heparin per pharmacy NTG sublingual for chest pain  Bilateral bruits: Carotid dopplers ordered  HLD: Increased atorvastatin to 80mg  daily on admission Lipid panel ordered-Total 133, Tri 55, HDL 45, LDL 77, Ratio 3.0  Active smoker: Nicotine patch ordered. Patient advised to quit smoking.   The risks and benefits of the cardiac cath were explained to the patient. He agreed to proceed with the procedure.  For questions or updates, please contact [place] Please consult [email or web address] for contact info under Cardiology/STEMI.      Signed, Kathi Ludwig, MD  12/27/2017, 8:27 AM

## 2017-12-27 NOTE — Progress Notes (Signed)
Progress Note  Patient Name: Jerry Roach Date of Encounter: 12/27/2017  Primary Cardiologist: No primary care provider on file.   Subjective   The patient denied chest pain today following the nitro gtt initiation. He denied known PMHx. Has been a lifelong smoker and was not aware of any family cardiac history.  Inpatient Medications    Scheduled Meds: . aspirin EC  81 mg Oral Daily  . atorvastatin  80 mg Oral q1800  . nicotine  21 mg Transdermal Daily  . sodium chloride flush  3 mL Intravenous Q12H   Continuous Infusions: . sodium chloride    . sodium chloride 1 mL/kg/hr (12/27/17 0738)  . heparin 1,200 Units/hr (12/27/17 0174)  . nitroGLYCERIN 20 mcg/min (12/27/17 0612)   PRN Meds: sodium chloride, acetaminophen, nitroGLYCERIN, ondansetron (ZOFRAN) IV, sodium chloride flush   Vital Signs    Vitals:   12/27/17 0600 12/27/17 0630 12/27/17 0700 12/27/17 0749  BP: (!) 107/59 106/60 (!) 106/50   Pulse: 69 64 74   Resp: 14 18 (!) 26   Temp:    98.6 F (37 C)  TempSrc:    Oral  SpO2: 97% 95% 96%   Weight:  92.5 kg (203 lb 14.4 oz)    Height:        Intake/Output Summary (Last 24 hours) at 12/27/2017 0827 Last data filed at 12/27/2017 0501 Gross per 24 hour  Intake 180.2 ml  Output 250 ml  Net -69.8 ml   Filed Weights   12/26/17 1822 12/27/17 0630  Weight: 94.3 kg (208 lb) 92.5 kg (203 lb 14.4 oz)    Telemetry    NSR - Personally Reviewed  ECG    NSR, Mild ST elevation in leads V2-V4 on admission - Personally Reviewed  Physical Exam   GEN: No acute distress.  Resting comfortably in his bed. Alert and oriented x3.  Neck: No JVD. Carotid bruits bilaterally.   Cardiac: RRR, Grad II sys apical murmur, rubs, or gallops.  Respiratory: Clear to auscultation bilaterally. GI: Soft, nontender, non-distended  MS: No edema; No deformity. Neuro:  Nonfocal  Psych: Normal affect   Labs    Chemistry Recent Labs  Lab 12/26/17 1634 12/27/17 0212  NA 138  136  K 3.8 4.2  CL 102 103  CO2 26 23  GLUCOSE 113* 116*  BUN 23* 19  CREATININE 1.58* 1.25*  CALCIUM 9.4 9.0  GFRNONAA 43* 57*  GFRAA 49* >60  ANIONGAP 10 10     Hematology Recent Labs  Lab 12/26/17 1634 12/27/17 0553  WBC 7.1 10.7*  RBC 4.72 4.51  HGB 15.0 14.3  HCT 44.8 42.5  MCV 94.9 94.2  MCH 31.8 31.7  MCHC 33.5 33.6  RDW 12.9 13.0  PLT 148* 142*    Cardiac Enzymes Recent Labs  Lab 12/26/17 1634 12/27/17 0212  TROPONINI 0.36* 2.10*  2.13*   No results for input(s): TROPIPOC in the last 168 hours.   BNPNo results for input(s): BNP, PROBNP in the last 168 hours.   DDimer No results for input(s): DDIMER in the last 168 hours.   Radiology    Dg Chest 2 View  Result Date: 12/26/2017 CLINICAL DATA:  Chest pain with productive cough EXAM: CHEST - 2 VIEW COMPARISON:  None. FINDINGS: Mild diffuse bronchitic changes. No consolidation or effusion. Normal heart size. No pneumothorax. IMPRESSION: No active cardiopulmonary disease.  Mild diffuse bronchitic changes. Electronically Signed   By: Donavan Foil M.D.   On: 12/26/2017 16:13  Cardiac Studies   N/A  Patient Profile     72 y.o. male with HLD, active smoker who presented with chest pain x4 days that worsened to the extent that he desired evaluation. He stated that the pain occurred while walking at a slow pace, was centrally located with radiation into his throat and bilateral jaws. At the time of admission he denied associated nausea, vomiting, diaphoresis, presyncope, syncope, dyspnea, pain in his arms or ever having had that pain before. EKG indicated possible mild changes concerning for a potentia inferior infarct but no overt ST elevation. Troponin was positive at 0.36 and increased to 2.13 before decreasing to 2.10. The chest pain continued despite three sublingual NTG tablets until he was started on the Nitro gtt. IV heparin was ordered and he was placed in the [facility] ICU in preparation for cardiac  cath the following morning.   Assessment & Plan    NSTEMI:  Chest pain resolved with NTG gtt. IV heparin drip continued. Cardiac cath planned for this am. Holding BB and ACEi a this time due to soft BP.  Scheduled for cardiac cath. Troponin enzymes peaked. Echocardiogram ordered post cath Continue ASA Heparin per pharmacy NTG sublingual for chest pain  Bilateral bruits: Carotid dopplers ordered  HLD: Increased atorvastatin to 80mg  daily on admission Lipid panel ordered-Total 133, Tri 55, HDL 45, LDL 77, Ratio 3.0  Active smoker: Nicotine patch ordered. Patient advised to quit smoking.   The risks and benefits of the cardiac cath were explained to the patient. He agreed to proceed with the procedure.  For questions or updates, please contact [place] Please consult [email or web address] for contact info under Cardiology/STEMI.      Signed, Kathi Ludwig, MD  12/27/2017, 8:27 AM

## 2017-12-27 NOTE — Progress Notes (Signed)
Critical Troponin of 2.13 received. Dr.  Lions paged. Will continue to monitor.

## 2017-12-28 ENCOUNTER — Encounter (HOSPITAL_COMMUNITY): Payer: Medicare Other

## 2017-12-28 LAB — BASIC METABOLIC PANEL
Anion gap: 9 (ref 5–15)
BUN: 14 mg/dL (ref 6–20)
CALCIUM: 8.7 mg/dL — AB (ref 8.9–10.3)
CO2: 24 mmol/L (ref 22–32)
CREATININE: 1.19 mg/dL (ref 0.61–1.24)
Chloride: 102 mmol/L (ref 101–111)
GFR calc non Af Amer: >60 mL/min (ref >60)
Glucose, Bld: 114 mg/dL — ABNORMAL HIGH (ref 65–99)
Potassium: 3.9 mmol/L (ref 3.5–5.1)
SODIUM: 135 mmol/L (ref 135–145)

## 2017-12-28 LAB — CBC
HCT: 43.3 % (ref 39.0–52.0)
Hemoglobin: 14.7 g/dL (ref 13.0–17.0)
MCH: 32.5 pg (ref 26.0–34.0)
MCHC: 33.9 g/dL (ref 30.0–36.0)
MCV: 95.6 fL (ref 78.0–100.0)
PLATELETS: 136 10*3/uL — AB (ref 150–400)
RBC: 4.53 MIL/uL (ref 4.22–5.81)
RDW: 13.5 % (ref 11.5–15.5)
WBC: 11.1 10*3/uL — AB (ref 4.0–10.5)

## 2017-12-28 NOTE — Plan of Care (Signed)
  Problem: Education: Goal: Knowledge of General Education information will improve 12/28/2017 0805 by Hillard Danker, RN Outcome: Progressing 12/28/2017 0805 by Hillard Danker, RN Outcome: Progressing   Problem: Health Behavior/Discharge Planning: Goal: Ability to manage health-related needs will improve 12/28/2017 0805 by Hillard Danker, RN Outcome: Progressing 12/28/2017 0805 by Hillard Danker, RN Outcome: Progressing   Problem: Clinical Measurements: Goal: Will remain free from infection 12/28/2017 0805 by Hillard Danker, RN Outcome: Progressing 12/28/2017 0805 by Hillard Danker, RN Outcome: Progressing Goal: Cardiovascular complication will be avoided 12/28/2017 0805 by Hillard Danker, RN Outcome: Progressing 12/28/2017 0805 by Hillard Danker, RN Outcome: Progressing   Problem: Coping: Goal: Level of anxiety will decrease 12/28/2017 0805 by Hillard Danker, RN Outcome: Progressing 12/28/2017 0805 by Hillard Danker, RN Outcome: Progressing   Problem: Safety: Goal: Ability to remain free from injury will improve 12/28/2017 0805 by Hillard Danker, RN Outcome: Progressing 12/28/2017 0805 by Hillard Danker, RN Outcome: Progressing   Problem: Skin Integrity: Goal: Risk for impaired skin integrity will decrease 12/28/2017 0805 by Hillard Danker, RN Outcome: Progressing 12/28/2017 0805 by Hillard Danker, RN Outcome: Progressing   Problem: Education: Goal: Understanding of CV disease, CV risk reduction, and recovery process will improve 12/28/2017 0805 by Hillard Danker, RN Outcome: Progressing 12/28/2017 0805 by Hillard Danker, RN Outcome: Progressing   Problem: Activity: Goal: Ability to return to baseline activity level will improve 12/28/2017 0805 by Hillard Danker, RN Outcome: Progressing 12/28/2017 0805 by Hillard Danker, RN Outcome: Progressing

## 2017-12-28 NOTE — Plan of Care (Signed)
Report called to 4E RN

## 2017-12-28 NOTE — Progress Notes (Signed)
CARDIAC REHAB PHASE I   PRE:  Rate/Rhythm: 62 SR  BP:  Supine:   Sitting: 101/60  Standing:    SaO2: 95% RA  MODE:  Ambulation: 370 ft   POST:  Rate/Rhythm: 69 SR  BP:  Supine:   Sitting: 104/52  Standing:    SaO2: 96% RA  [id number] Patient tolerated ambulation well with assist x1. Gait steady, no c/o, VSS. To chair after walk with legs elevated. MI/stent education completed with pt including restrictions, CP, NTG use and calling 911, importance of Brilinta, risk factor modification, tobacco cessation, and activity progression. Pt needs reinforcement regarding risk factor modification. MI book, heart healthy diet handout, and exercise guidelines given. Pt verbalizes understanding of instructions given. Discussed Phase 2 cardiac rehab and referral will be sent to program at [facility].  Sol Passer, MS, ACSM CEP

## 2017-12-28 NOTE — Plan of Care (Signed)
  Problem: Education: Goal: Knowledge of General Education information will improve Outcome: Progressing   Problem: Health Behavior/Discharge Planning: Goal: Ability to manage health-related needs will improve Outcome: Progressing   Problem: Clinical Measurements: Goal: Ability to maintain clinical measurements within normal limits will improve Outcome: Progressing Goal: Will remain free from infection Outcome: Progressing Goal: Diagnostic test results will improve Outcome: Progressing Goal: Respiratory complications will improve Outcome: Progressing Goal: Cardiovascular complication will be avoided Outcome: Progressing   Problem: Activity: Goal: Risk for activity intolerance will decrease Outcome: Progressing   Problem: Nutrition: Goal: Adequate nutrition will be maintained Outcome: Progressing   Problem: Coping: Goal: Level of anxiety will decrease Outcome: Progressing   Problem: Elimination: Goal: Will not experience complications related to bowel motility Outcome: Progressing Goal: Will not experience complications related to urinary retention Outcome: Progressing   Problem: Pain Managment: Goal: General experience of comfort will improve Outcome: Progressing   Problem: Safety: Goal: Ability to remain free from injury will improve Outcome: Progressing   Problem: Skin Integrity: Goal: Risk for impaired skin integrity will decrease Outcome: Progressing   Problem: Education: Goal: Understanding of CV disease, CV risk reduction, and recovery process will improve Outcome: Progressing   Problem: Activity: Goal: Ability to return to baseline activity level will improve Outcome: Progressing   Problem: Cardiovascular: Goal: Ability to achieve and maintain adequate cardiovascular perfusion will improve Outcome: Progressing Goal: Vascular access site(s) Level 0-1 will be maintained Outcome: Progressing   Problem: Health Behavior/Discharge Planning: Goal:  Ability to safely manage health-related needs after discharge will improve Outcome: Progressing

## 2017-12-28 NOTE — Progress Notes (Signed)
Progress Note  Patient Name: Jerry Roach Date of Encounter: 12/28/2017  Primary Cardiologist: No primary care provider on file.   Subjective   Feels well, denies any angina or dyspnea even when walking has a bruise at the right radial Access site, but it is nontender and he has an excellent radial pulse.  Tells me he quit smoking the day before yesterday.  Inpatient Medications    Scheduled Meds: . aspirin EC  81 mg Oral Daily  . atorvastatin  80 mg Oral q1800  . heparin  5,000 Units Subcutaneous Q8H  . nicotine  21 mg Transdermal Daily  . sodium chloride flush  3 mL Intravenous Q12H  . ticagrelor  90 mg Oral BID   Continuous Infusions: . sodium chloride     PRN Meds: sodium chloride, acetaminophen, nitroGLYCERIN, ondansetron (ZOFRAN) IV, sodium chloride flush   Vital Signs    Vitals:   12/28/17 0401 12/28/17 0800 12/28/17 0801 12/28/17 0825  BP:  116/66  (!) 104/52  Pulse:      Resp:  15  16  Temp: 98.6 F (37 C)  98.8 F (37.1 C)   TempSrc: Oral  Oral   SpO2:  99%    Weight:      Height:        Intake/Output Summary (Last 24 hours) at 12/28/2017 1009 Last data filed at 12/28/2017 0900 Gross per 24 hour  Intake 1372.33 ml  Output 400 ml  Net 972.33 ml   Filed Weights   12/26/17 1822 12/27/17 0630  Weight: 208 lb (94.3 kg) 203 lb 14.4 oz (92.5 kg)    Telemetry    Normal sinus rhythm- Personally Reviewed  ECG    Normal sinus rhythm, first-degree AV block, slightly delayed R wave progression across the anterior precordium- Personally Reviewed  Physical Exam  Comfortable GEN: No acute distress.   Neck: No JVD, bilateral carotid bruits Cardiac: RRR, early peaking 3/6 systolic aortic ejection murmur, no diastolicmurmurs, rubs, or gallops.  Respiratory: Clear to auscultation bilaterally. GI: Soft, nontender, non-distended  MS: No edema; No deformity. Neuro:  Nonfocal  Psych: Normal affect   Labs    Chemistry Recent Labs  Lab 12/26/17 1634  12/27/17 0212 12/28/17 0417  NA 138 136 135  K 3.8 4.2 3.9  CL 102 103 102  CO2 26 23 24   GLUCOSE 113* 116* 114*  BUN 23* 19 14  CREATININE 1.58* 1.25* 1.19  CALCIUM 9.4 9.0 8.7*  GFRNONAA 43* 57* >60  GFRAA 49* >60 >60  ANIONGAP 10 10 9      Hematology Recent Labs  Lab 12/26/17 1634 12/27/17 0553 12/28/17 0417  WBC 7.1 10.7* 11.1*  RBC 4.72 4.51 4.53  HGB 15.0 14.3 14.7  HCT 44.8 42.5 43.3  MCV 94.9 94.2 95.6  MCH 31.8 31.7 32.5  MCHC 33.5 33.6 33.9  RDW 12.9 13.0 13.5  PLT 148* 142* 136*    Cardiac Enzymes Recent Labs  Lab 12/26/17 1634 12/27/17 0212  TROPONINI 0.36* 2.10*  2.13*   No results for input(s): TROPIPOC in the last 168 hours.   BNPNo results for input(s): BNP, PROBNP in the last 168 hours.   DDimer No results for input(s): DDIMER in the last 168 hours.   Radiology    Dg Chest 2 View  Result Date: 12/26/2017 CLINICAL DATA:  Chest pain with productive cough EXAM: CHEST - 2 VIEW COMPARISON:  None. FINDINGS: Mild diffuse bronchitic changes. No consolidation or effusion. Normal heart size. No pneumothorax. IMPRESSION: No  active cardiopulmonary disease.  Mild diffuse bronchitic changes. Electronically Signed   By: Donavan Foil M.D.   On: 12/26/2017 16:13    Cardiac Studies   CATH 12/27/2017 LEFT HEART CATH AND CORONARY ANGIOGRAPHY  Conclusion   Conclusions: 1. Severe 2-vessel coronary artery disease, including sequential 80% and 40% proximal and mid LAD stenoses, as well as 50% ostial and 100% distal RCA lesions. 2. Moderate disease involving LCx and dominant OM branch. 3. Collateralization of distal RCA branches and appearance of thrombus in the distal RCA suggests subacute thrombosis. 4. Mildly elevated left ventricular filling pressure. 5. Moderately elevated LVOT gradient, likely due to aortic stenosis. 6. Basal and mid inferior hypokinesis with otherwise preserved left ventricular contraction. 7. Successful PCI to the proximal LAD using  Resolute Onyx 3.0 x 18 mm drug-eluting stent with 0% residual stenosis and TIMI-3 flow.  Recommendations: 1. Medical therapy for moderate LCx disease and occluded distal RCA with left-to-right collaterals. 2. Dual antiplatelet therapy with aspirin and ticagrelor for at least 12 months. 3. Aggressive secondary prevention. 4. Obtain echocardiogram for further evaluation of suspect aortic stenosis.    ECHO 12/27/2017 - Left ventricle: The cavity size was normal. Systolic function was   normal. The estimated ejection fraction was in the range of 55%   to 60%. Wall motion was normal; there were no regional wall   motion abnormalities. Features are consistent with a pseudonormal   left ventricular filling pattern, with concomitant abnormal   relaxation and increased filling pressure (grade 2 diastolic   dysfunction). - Aortic valve: Trileaflet; moderately thickened, moderately   calcified leaflets. Valve mobility was restricted. There was   moderate stenosis. There was mild regurgitation. Peak velocity   (S): 339 cm/s. Mean gradient (S): 28 mm Hg. - Mitral valve: Calcified annulus. - Left atrium: The atrium was moderately dilated. - Right ventricle: The cavity size was mildly dilated. Wall   thickness was normal. - Right atrium: The atrium was mildly dilated.    Patient Profile     72 y.o. male smoker with hypercholesterolemia admitted with non-STEMI and received a drug-eluting stent to the LAD artery, with residual moderate disease in the left circumflex and chronic total occlusion of the distal RCA.  No clinical heart failure and preserved left ventricular systolic function, but with Doppler findings of elevated filling pressure.  Echo shows moderate aortic stenosis.  Assessment & Plan    1. CAD s/p NSTEMI: mandatory DAPT x 12 months. On statin. No beta blocker due to bradycardia and long PR. 2. HLP: contemplating AEGIS trial. 3. AS: currently asymptomatic. Discussed symptoms of  severe AS in the future. 4. Diast dysf:  Asymptomatic. Despite echo findings, will avoid diuretics due to CKD. 5. CKD: creat has improved further, borderline abnormal. 6. Smoking:  "I quit the day before yesterday"  For questions or updates, please contact [place] Please consult [email or web address] for contact info under Cardiology/STEMI.      Signed, Sanda Klein, MD  12/28/2017, 10:09 AM

## 2017-12-28 NOTE — Plan of Care (Signed)
Transferred to 4E 07 via wheelchair and monitor.RN to receive in room

## 2017-12-29 ENCOUNTER — Inpatient Hospital Stay (HOSPITAL_COMMUNITY): Payer: Medicare Other

## 2017-12-29 DIAGNOSIS — R0989 Other specified symptoms and signs involving the circulatory and respiratory systems: Secondary | ICD-10-CM

## 2017-12-29 DIAGNOSIS — I2510 Atherosclerotic heart disease of native coronary artery without angina pectoris: Secondary | ICD-10-CM

## 2017-12-29 DIAGNOSIS — Z9861 Coronary angioplasty status: Secondary | ICD-10-CM

## 2017-12-29 MED ORDER — NITROGLYCERIN 0.4 MG SL SUBL: 0.4000 mg | SUBLINGUAL_TABLET | SUBLINGUAL | 3 refills | Status: DC | PRN

## 2017-12-29 MED ORDER — ATORVASTATIN CALCIUM 80 MG PO TABS: 80.0000 mg | ORAL_TABLET | Freq: Every day | ORAL | 3 refills | Status: DC

## 2017-12-29 MED ORDER — ASPIRIN 81 MG PO TBEC: 81.0000 mg | DELAYED_RELEASE_TABLET | Freq: Every day | ORAL | Status: DC

## 2017-12-29 MED ORDER — ACETAMINOPHEN 325 MG PO TABS
650.0000 mg | ORAL_TABLET | Freq: Four times a day (QID) | ORAL | Status: DC | PRN
Start: 2017-12-29 — End: 2022-12-26

## 2017-12-29 MED ORDER — TICAGRELOR 90 MG PO TABS: 90.0000 mg | ORAL_TABLET | Freq: Two times a day (BID) | ORAL | 3 refills | Status: DC

## 2017-12-29 NOTE — Discharge Instructions (Signed)
Coronary Angiogram With Stent, Care After This sheet gives you information about how to care for yourself after your procedure. Your health care provider may also give you more specific instructions. If you have problems or questions, contact your health care provider. What can I expect after the procedure? After your procedure, it is common to have:  Bruising in the area where a small, thin tube (catheter) was inserted. This usually fades within 1-2 weeks.  Blood collecting in the tissue (hematoma) that may be painful to the touch. It should usually decrease in size and tenderness within 1-2 weeks.  Follow these instructions at home: Insertion area care  Do not take baths, swim, or use a hot tub until your health care provider approves.  You may shower 24-48 hours after the procedure or as directed by your health care provider.  Follow instructions from your health care provider about how to take care of your incision. Make sure you: ? Wash your hands with soap and water before you change your bandage (dressing). If soap and water are not available, use hand sanitizer. ? Change your dressing as told by your health care provider. ? Leave stitches (sutures), skin glue, or adhesive strips in place. These skin closures may need to stay in place for 2 weeks or longer. If adhesive strip edges start to loosen and curl up, you may trim the loose edges. Do not remove adhesive strips completely unless your health care provider tells you to do that.  Remove the bandage (dressing) and gently wash the catheter insertion site with plain soap and water.  Pat the area dry with a clean towel. Do not rub the area, because that may cause bleeding.  Do not apply powder or lotion to the incision area.  Check your incision area every day for signs of infection. Check for: ? More redness, swelling, or pain. ? More fluid or blood. ? Warmth. ? Pus or a bad smell. Activity  Do not drive for 24 hours if you  were given a medicine to help you relax (sedative).  Do not lift anything that is heavier than 10 lb (4.5 kg) for 5 days after your procedure or as directed by your health care provider.  Ask your health care provider when it is okay for you: ? To return to work or school. ? To resume usual physical activities or sports. ? To resume sexual activity. Eating and drinking  Eat a heart-healthy diet. This should include plenty of fresh fruits and vegetables.  Avoid the following types of food: ? Food that is high in salt. ? Canned or highly processed food. ? Food that is high in saturated fat or sugar. ? [employer].  Limit alcohol intake to no more than 1 drink a day for non-pregnant women and 2 drinks a day for men. One drink equals 12 oz of beer, 5 oz of wine, or 1 oz of hard liquor. Lifestyle  Do not use any products that contain nicotine or tobacco, such as cigarettes and e-cigarettes. If you need help quitting, ask your health care provider.  Take steps to manage and control your weight.  Get regular exercise.  Manage your blood pressure.  Manage other health problems, such as diabetes. General instructions  Take over-the-counter and prescription medicines only as told by your health care provider. Blood thinners may be prescribed after your procedure to improve blood flow through the stent.  If you need an MRI after your heart stent has been placed, be  sure to tell the health care provider who orders the MRI that you have a heart stent.  Keep all follow-up visits as directed by your health care provider. This is important. Contact a health care provider if:  You have a fever.  You have chills.  You have increased bleeding from the catheter insertion area. Hold pressure on the area. Get help right away if:  You develop chest pain or shortness of breath.  You feel faint or you pass out.  You have unusual pain at the catheter insertion area.  You have redness,  warmth, or swelling at the catheter insertion area.  You have drainage (other than a small amount of blood on the dressing) from the catheter insertion area.  The catheter insertion area is bleeding, and the bleeding does not stop after 30 minutes of holding steady pressure on the area.  You develop bleeding from any other place, such as from your rectum. There may be bright red blood in your urine or stool, or it may appear as black, tarry stool. This information is not intended to replace advice given to you by your health care provider. Make sure you discuss any questions you have with your health care provider. Document Released: 04/13/2005 Document Revised: 06/21/2016 Document Reviewed: 06/21/2016 Elsevier Interactive Patient Education  [employer].

## 2017-12-29 NOTE — Progress Notes (Signed)
Progress Note  Patient Name: Jerry Roach Date of Encounter: 12/29/2017  Primary Cardiologist: No primary care provider on file.   Subjective   No chest pain wants to go home just back from getting carotid US  Inpatient Medications    Scheduled Meds: . aspirin EC  81 mg Oral Daily  . atorvastatin  80 mg Oral q1800  . heparin  5,000 Units Subcutaneous Q8H  . nicotine  21 mg Transdermal Daily  . sodium chloride flush  3 mL Intravenous Q12H  . ticagrelor  90 mg Oral BID   Continuous Infusions: . sodium chloride     PRN Meds: sodium chloride, acetaminophen, nitroGLYCERIN, ondansetron (ZOFRAN) IV, sodium chloride flush   Vital Signs    Vitals:   12/28/17 1623 12/28/17 2000 12/29/17 0531 12/29/17 0732  BP: 121/68 (!) 108/56 (!) 110/59 94/68  Pulse: 68 71  67  Resp: 20 (!) 21 (!) 23 15  Temp: 98.8 F (37.1 C) 100.1 F (37.8 C) 98.4 F (36.9 C) 98.1 F (36.7 C)  TempSrc: Oral Oral Oral Oral  SpO2: 99% 96% 96% 96%  Weight:      Height:        Intake/Output Summary (Last 24 hours) at 12/29/2017 1026 Last data filed at 12/29/2017 0800 Gross per 24 hour  Intake 1080 ml  Output -  Net 1080 ml   Filed Weights   12/26/17 1822 12/27/17 0630  Weight: 208 lb (94.3 kg) 203 lb 14.4 oz (92.5 kg)    Telemetry    Normal sinus rhythm- Personally Reviewed  ECG    Normal sinus rhythm, first-degree AV block, slightly delayed R wave progression across the anterior precordium- Personally Reviewed  Physical Exam  BP 94/68 (BP Location: Left Arm)   Pulse 67   Temp 98.1 F (36.7 C) (Oral)   Resp 15   Ht 5' 10.5" (1.791 m)   Wt 203 lb 14.4 oz (92.5 kg)   SpO2 96%   BMI 28.84 kg/m  Affect appropriate Healthy:  appears stated age HEENT: normal Neck supple with no adenopathy JVP normal no bruits no thyromegaly Lungs clear with no wheezing and good diaphragmatic motion Heart:  S1/S2 AS murmur, no rub, gallop or click PMI normal Abdomen: benighn, BS positve, no  tenderness, no AAA no bruit.  No HSM or HJR Distal pulses intact with no bruits No edema Neuro non-focal Skin warm and dry No muscular weakness Right radial with moderate sized ecchymosis   Labs    Chemistry Recent Labs  Lab 12/26/17 1634 12/27/17 0212 12/28/17 0417  NA 138 136 135  K 3.8 4.2 3.9  CL 102 103 102  CO2 26 23 24   GLUCOSE 113* 116* 114*  BUN 23* 19 14  CREATININE 1.58* 1.25* 1.19  CALCIUM 9.4 9.0 8.7*  GFRNONAA 43* 57* >60  GFRAA 49* >60 >60  ANIONGAP 10 10 9      Hematology Recent Labs  Lab 12/26/17 1634 12/27/17 0553 12/28/17 0417  WBC 7.1 10.7* 11.1*  RBC 4.72 4.51 4.53  HGB 15.0 14.3 14.7  HCT 44.8 42.5 43.3  MCV 94.9 94.2 95.6  MCH 31.8 31.7 32.5  MCHC 33.5 33.6 33.9  RDW 12.9 13.0 13.5  PLT 148* 142* 136*    Cardiac Enzymes Recent Labs  Lab 12/26/17 1634 12/27/17 0212  TROPONINI 0.36* 2.10*  2.13*   No results for input(s): TROPIPOC in the last 168 hours.    Radiology    No results found.  Cardiac Studies  CATH 12/27/2017 LEFT HEART CATH AND CORONARY ANGIOGRAPHY  Conclusion   Conclusions: 1. Severe 2-vessel coronary artery disease, including sequential 80% and 40% proximal and mid LAD stenoses, as well as 50% ostial and 100% distal RCA lesions. 2. Moderate disease involving LCx and dominant OM branch. 3. Collateralization of distal RCA branches and appearance of thrombus in the distal RCA suggests subacute thrombosis. 4. Mildly elevated left ventricular filling pressure. 5. Moderately elevated LVOT gradient, likely due to aortic stenosis. 6. Basal and mid inferior hypokinesis with otherwise preserved left ventricular contraction. 7. Successful PCI to the proximal LAD using Resolute Onyx 3.0 x 18 mm drug-eluting stent with 0% residual stenosis and TIMI-3 flow.  Recommendations: 1. Medical therapy for moderate LCx disease and occluded distal RCA with left-to-right collaterals. 2. Dual antiplatelet therapy with aspirin and  ticagrelor for at least 12 months. 3. Aggressive secondary prevention. 4. Obtain echocardiogram for further evaluation of suspect aortic stenosis.    ECHO 12/27/2017 - Left ventricle: The cavity size was normal. Systolic function was   normal. The estimated ejection fraction was in the range of 55%   to 60%. Wall motion was normal; there were no regional wall   motion abnormalities. Features are consistent with a pseudonormal   left ventricular filling pattern, with concomitant abnormal   relaxation and increased filling pressure (grade 2 diastolic   dysfunction). - Aortic valve: Trileaflet; moderately thickened, moderately   calcified leaflets. Valve mobility was restricted. There was   moderate stenosis. There was mild regurgitation. Peak velocity   (S): 339 cm/s. Mean gradient (S): 28 mm Hg. - Mitral valve: Calcified annulus. - Left atrium: The atrium was moderately dilated. - Right ventricle: The cavity size was mildly dilated. Wall   thickness was normal. - Right atrium: The atrium was mildly dilated.    Patient Profile     72 y.o. male smoker with hypercholesterolemia admitted with non-STEMI and received a drug-eluting stent to the LAD artery, with residual moderate disease in the left circumflex and chronic total occlusion of the distal RCA.  No clinical heart failure and preserved left ventricular systolic function, but with Doppler findings of elevated filling pressure.  Echo shows moderate aortic stenosis.  Assessment & Plan    1. CAD s/p NSTEMI: DAT post DES LAD no beta blocker due to bradycardia on statin  2. HLP: contemplating AEGIS trial. 3. AS: currently asymptomatic. Discussed symptoms of severe AS in the future.moderate by exam and TTE 4. Diast dysf:  Asymptomatic. Despite echo findings, will avoid diuretics due to CKD. 5. CKD: creat has improved 1.19 yesterday  6. Smoking:  Committed to quit this admission  Pending results of carotid duplex for d/c today likely  referred bruit from AS murmur  Outpatient f/u Dr End or Croitoru   For questions or updates, please contact [facility] Please consult [email or web address] for contact info under Cardiology/STEMI.      Signed, Jenkins Rouge, MD  12/29/2017, 10:26 AM

## 2017-12-29 NOTE — Discharge Summary (Signed)
Discharge Summary    Patient ID: Jerry WATERSON,  MRN: [id number], DOB/AGE: October 04, 1946 72 y.o.  Admit date: 12/26/2017 Discharge date: 12/29/2017  Primary Care Provider: [employer] Primary Cardiologist: To be established in [place]  Discharge Diagnoses    Principal Problem:   NSTEMI (non-ST elevated myocardial infarction) [facility]) Active Problems:   CAD -S/P PCI 12/27/17   Moderate aortic stenosis   Bilateral carotid bruits   Dyslipidemia, goal LDL below 70   Tobacco abuse   Allergies No Known Allergies  Diagnostic Studies/Procedures    Cath/PCI 12/27/17 Echo 12/27/17 Carotid dopplers 12/29/17 _____________   History of Present Illness     72 y/o male presented to [facility] 12/26/17 with chest pain and ruled in for NSTEMI.  Hospital Course      72 y/o male from [place] with a history of HLD, smoking, and BPH, presented to [facility] 12/26/17 with chest pain. He ruled in for a NSTEMI and was transferred to [facility] 12/27/17 for cath. This revealed high grade LAD disease which was treated with PCI/ DES. The pt had residual CTO of his RCA and moderate disease in the CFX to be treated medically. His Troponin peaked at 2. Echo done 322/19 showed moderate AS with normal LVF. Carotid dopplers done before discharge are pending. Dr Johnsie Cancel saw him the morning of 12/29/17 and felt he was stable for discharge. The pt has requested f/u in the [place] office and this will be arranged. Of note he was not discharged on a beta blocker secondary to bradycardia, long PR, and hypotension, we may consider adding this as an OP.  _____________  Discharge Vitals Blood pressure 94/68, pulse 67, temperature 98.1 F (36.7 C), temperature source Oral, resp. rate 15, height 5' 10.5" (1.791 m), weight 203 lb 14.4 oz (92.5 kg), SpO2 96 %.  Filed Weights   12/26/17 1822 12/27/17 0630  Weight: 208 lb (94.3 kg) 203 lb 14.4 oz (92.5 kg)    Labs & Radiologic Studies    CBC Recent Labs    12/27/17 0553  12/28/17 0417  WBC 10.7* 11.1*  HGB 14.3 14.7  HCT 42.5 43.3  MCV 94.2 95.6  PLT 142* 269*   Basic Metabolic Panel Recent Labs    12/27/17 0212 12/28/17 0417  NA 136 135  K 4.2 3.9  CL 103 102  CO2 23 24  GLUCOSE 116* 114*  BUN 19 14  CREATININE 1.25* 1.19  CALCIUM 9.0 8.7*   Liver Function Tests No results for input(s): AST, ALT, ALKPHOS, BILITOT, PROT, ALBUMIN in the last 72 hours. No results for input(s): LIPASE, AMYLASE in the last 72 hours. Cardiac Enzymes Recent Labs    12/26/17 1634 12/27/17 0212  TROPONINI 0.36* 2.10*  2.13*   BNP Invalid input(s): POCBNP D-Dimer No results for input(s): DDIMER in the last 72 hours. Hemoglobin A1C No results for input(s): HGBA1C in the last 72 hours. Fasting Lipid Panel Recent Labs    12/27/17 0212  CHOL 133  HDL 45  LDLCALC 77  TRIG 55  CHOLHDL 3.0   Thyroid Function Tests No results for input(s): TSH, T4TOTAL, T3FREE, THYROIDAB in the last 72 hours.  Invalid input(s): FREET3 _____________  Dg Chest 2 View  Result Date: 12/26/2017 CLINICAL DATA:  Chest pain with productive cough EXAM: CHEST - 2 VIEW COMPARISON:  None. FINDINGS: Mild diffuse bronchitic changes. No consolidation or effusion. Normal heart size. No pneumothorax. IMPRESSION: No active cardiopulmonary disease.  Mild diffuse bronchitic changes. Electronically Signed   By:  Donavan Foil M.D.   On: 12/26/2017 16:13   Disposition   Pt is being discharged home today in good condition.  Follow-up Plans & Appointments    Follow-up Information    Erma Heritage, PA-C Follow up.   Specialties:  Physician Assistant, Cardiology Why:  office will contact you for follow up Contact information: [place] [id number] [phone]          Discharge Instructions    Amb Referral to Cardiac Rehabilitation   Complete by:  As directed    Diagnosis:   NSTEMI PTCA Coronary Stents        Discharge Medications   Allergies as of  12/29/2017   No Known Allergies     Medication List    TAKE these medications   acetaminophen 325 MG tablet Commonly known as:  TYLENOL Take 2 tablets (650 mg total) by mouth every 6 (six) hours as needed for mild pain or headache.   alfuzosin 10 MG 24 hr tablet Commonly known as:  UROXATRAL Take 10 mg by mouth daily.   aspirin 81 MG EC tablet Take 1 tablet (81 mg total) by mouth daily. Start taking on:  12/30/2017   atorvastatin 80 MG tablet Commonly known as:  LIPITOR Take 1 tablet (80 mg total) by mouth daily at 6 PM. What changed:    medication strength  how much to take  when to take this   nitroGLYCERIN 0.4 MG SL tablet Commonly known as:  NITROSTAT Place 1 tablet (0.4 mg total) under the tongue every 5 (five) minutes x 3 doses as needed for chest pain.   ticagrelor 90 MG Tabs tablet Commonly known as:  BRILINTA Take 1 tablet (90 mg total) by mouth 2 (two) times daily.        Aspirin prescribed at discharge?  Yes High Intensity Statin Prescribed? (Lipitor 40-80mg  or Crestor 20-40mg ): Yes Beta Blocker Prescribed? No: he was not discharged on a beta blocker secondary to bradycardia, long PR, and hypotension, we may consider adding this as an OP.  For EF <40%, was ACEI/ARB Prescribed? No: NA ADP Receptor Inhibitor Prescribed? (i.e. Plavix etc.-Includes Medically Managed Patients): Yes For EF <40%, Aldosterone Inhibitor Prescribed? No: NA Was EF assessed during THIS hospitalization? Yes Was Cardiac Rehab II ordered? (Included Medically managed Patients): Yes   Outstanding Labs/Studies   Carotid doppler  Duration of Discharge Encounter   Greater than 30 minutes including physician time.  Angelena Form PA 12/29/2017, 11:08 AM

## 2017-12-29 NOTE — Progress Notes (Signed)
VASCULAR LAB PRELIMINARY  PRELIMINARY  PRELIMINARY  PRELIMINARY  Carotid duplex completed.    Preliminary report:  1-39% ICA stenosis. Vertebral artery flow is antegrade.   Audreyanna Butkiewicz, RVT 12/29/2017, 10:18 AM

## 2017-12-30 ENCOUNTER — Other Ambulatory Visit: Payer: Self-pay

## 2017-12-30 DIAGNOSIS — I6523 Occlusion and stenosis of bilateral carotid arteries: Secondary | ICD-10-CM

## 2017-12-30 MED FILL — Lidocaine HCl Local Inj 1%: INTRAMUSCULAR | Qty: 20 | Status: AC

## 2017-12-30 MED FILL — Heparin Sodium (Porcine) 2 Unit/ML in Sodium Chloride 0.9%: INTRAMUSCULAR | Qty: 1000 | Status: AC

## 2017-12-31 DIAGNOSIS — H40013 Open angle with borderline findings, low risk, bilateral: Secondary | ICD-10-CM | POA: Diagnosis not present

## 2017-12-31 DIAGNOSIS — Z9849 Cataract extraction status, unspecified eye: Secondary | ICD-10-CM | POA: Diagnosis not present

## 2017-12-31 DIAGNOSIS — H40053 Ocular hypertension, bilateral: Secondary | ICD-10-CM | POA: Diagnosis not present

## 2017-12-31 DIAGNOSIS — Z961 Presence of intraocular lens: Secondary | ICD-10-CM | POA: Diagnosis not present

## 2018-01-14 ENCOUNTER — Encounter: Payer: Self-pay | Admitting: Student

## 2018-01-14 NOTE — Progress Notes (Signed)
Cardiology Office Note    Date:  01/15/2018   ID:  Jerry Roach, Jerry Roach 03-21-1946, MRN [id number]  PCP:  Redmond School, MD  Cardiologist: New to [facility] - (Evaluated by Dr. Johnsie Cancel while at [facility] but no availability within the next 4 months --> will follow with Dr. Harl Bowie (New Patient to him)).   Chief Complaint  Patient presents with  . Hospitalization Follow-up    s/p NSTEMI    History of Present Illness:    Jerry Roach is a 72 y.o. male with past medical history of HLD and tobacco use who presents to the office today for hospital follow-up.  He initially presented to [facility] ED on 12/26/2017 for evaluation of chest pain with exertion. His initial EKG showed no acute ischemic changes but initial troponin was elevated at 0.36, therefore he was started on IV Heparin and transferred to [facility] for further management. Cyclic troponin values peaked at 2.13. He underwent a cardiac catheterization the following day which showed severe two-vessel CAD including sequential 80% and 40% proximal and mid LAD stenoses as well as 100% occlusion of the distal RCA with left to right collaterals.He underwent successful PCI to the proximal LAD using a Resolute DES. Continued medical management was recommended of his moderate LCx, RCA, and mid-LAD disease.  Echocardiogram showed a preserved EF of 18-56%, grade 2 diastolic dysfunction, and moderate AS with mild AI.  Carotid Dopplers demonstrated 1-39% stenosis bilaterally with repeat imaging recommended in 1 year.  He was discharged home on 12/29/2017 with ASA 81 mg daily, atorvastatin 80 mg daily, and Brilinta 90 mg twice daily.  He was not started on beta-blocker therapy secondary to bradycardia and hypotension.  In talking with the patient today, he overall reports doing well since his recent hospitalization. He denies any repeat episodes of chest pain. No recent dyspnea on exertion, orthopnea, PND, lower extremity edema, or palpitations. 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  He reports good compliance with his medication regimen including ASA and Brilinta, denies missing any recent doses. He is unsure about his co-pay for Brilinta but his wife is concerned this will be upwards of $400 as they have not yet met their deductible.    Past Medical History:  Diagnosis Date  . CAD (coronary artery disease)    a. 12/2017: s/p NSTEMI with DES to Proximal LAD. CTO of RCA with L--> R collaterals noted.   . Hypercholesteremia     Past Surgical History:  Procedure Laterality Date  . CATARACT EXTRACTION W/PHACO Left 03/03/2015   Procedure: CATARACT EXTRACTION PHACO AND INTRAOCULAR LENS PLACEMENT (IOC);  Surgeon: Baruch Goldmann, MD;  Location: [facility];  Service: Ophthalmology;  Laterality: Left;  CDE 6.86  . CATARACT EXTRACTION W/PHACO Right 08/31/2015   Procedure: CATARACT EXTRACTION PHACO AND INTRAOCULAR LENS PLACEMENT RIGHT EYE CDE=4.08;  Surgeon: Baruch Goldmann, MD;  Location: [facility];  Service: Ophthalmology;  Laterality: Right;  . COLONOSCOPY  11/05/2011   Procedure: COLONOSCOPY;  Surgeon: Daneil Dolin, MD;  Location: [facility];  Service: Endoscopy;  Laterality: N/A;  7:30 AM  . CORONARY STENT INTERVENTION N/A 12/27/2017   Procedure: CORONARY STENT INTERVENTION;  Surgeon: Nelva Bush, MD;  Location: [place] [facility];  Service: Cardiovascular;  Laterality: N/A;  . LEFT HEART CATH AND CORONARY ANGIOGRAPHY N/A 12/27/2017   Procedure: LEFT HEART CATH AND CORONARY ANGIOGRAPHY;  Surgeon: Nelva Bush, MD;  Location: [place] [facility]  LAB;  Service: Cardiovascular;  Laterality: N/A;  . NO PAST SURGERIES      Current Medications: Outpatient Medications Prior to Visit  Medication Sig Dispense Refill  . acetaminophen (TYLENOL) 325 MG tablet Take 2 tablets (650 mg total) by mouth every 6 (six) hours as needed for mild pain or headache.    . alfuzosin (UROXATRAL) 10 MG 24 hr tablet Take  10 mg by mouth daily.     Marland Kitchen aspirin EC 81 MG EC tablet Take 1 tablet (81 mg total) by mouth daily.    Marland Kitchen atorvastatin (LIPITOR) 80 MG tablet Take 1 tablet (80 mg total) by mouth daily at 6 PM. 90 tablet 3  . nitroGLYCERIN (NITROSTAT) 0.4 MG SL tablet Place 1 tablet (0.4 mg total) under the tongue every 5 (five) minutes x 3 doses as needed for chest pain. 25 tablet 3  . ticagrelor (BRILINTA) 90 MG TABS tablet Take 1 tablet (90 mg total) by mouth 2 (two) times daily. 180 tablet 3   No facility-administered medications prior to visit.      Allergies:   Patient has no known allergies.   Social History   Socioeconomic History  . Marital status: Married    Spouse name: Not on file  . Number of children: Not on file  . Years of education: Not on file  . Highest education level: Not on file  Occupational History  . Not on file  Social Needs  . Financial resource strain: Not on file  . Food insecurity:    Worry: Not on file    Inability: Not on file  . Transportation needs:    Medical: Not on file    Non-medical: Not on file  Tobacco Use  . Smoking status: Current Every Day Smoker    Packs/day: 1.50    Years: 39.00    Pack years: 58.50    Types: Cigarettes  . Smokeless tobacco: Never Used  Substance and Sexual Activity  . Alcohol use: No  . Drug use: No  . Sexual activity: Yes    Birth control/protection: None  Lifestyle  . Physical activity:    Days per week: Not on file    Minutes per session: Not on file  . Stress: Not on file  Relationships  . Social connections:    Talks on phone: Not on file    Gets together: Not on file    Attends religious service: Not on file    Active member of club or organization: Not on file    Attends meetings of clubs or organizations: Not on file    Relationship status: Not on file  Other Topics Concern  . Not on file  Social History Narrative  . Not on file     Family History:  The patient's family history includes Hypertension in  his father.   Review of Systems:   Please see the history of present illness.     General:  No chills, fever, night sweats or weight changes.  Cardiovascular:  No dyspnea on exertion, edema, orthopnea, palpitations, paroxysmal nocturnal dyspnea. Positive for chest pain (now resolved).  Dermatological: No rash, lesions/masses Respiratory: No cough, dyspnea Urologic: No hematuria, dysuria Abdominal:   No nausea, vomiting, diarrhea, bright red blood per rectum, melena, or hematemesis Neurologic:  No visual changes, wkns, changes in mental status. All other systems reviewed and are otherwise negative except as noted above.   Physical Exam:    VS:  BP (!) 100/58 (BP Location: Right Arm)  Pulse 79   Ht 5' 10.5" (1.791 m)   Wt 210 lb (95.3 kg)   SpO2 97%   BMI 29.71 kg/m    General: Well developed, well nourished Caucasian male appearing in no acute distress. Head: Normocephalic, atraumatic, sclera non-icteric, no xanthomas, nares are without discharge.  Neck: No carotid bruits. JVD not elevated.  Lungs: Respirations regular and unlabored, without wheezes or rales.  Heart: Regular rate and rhythm. No S3 or S4.  No rubs or gallops appreciated. 2/6 SEM along RUSB.  Abdomen: Soft, non-tender, non-distended with normoactive bowel sounds. No hepatomegaly. No rebound/guarding. No obvious abdominal masses. Msk:  Strength and tone appear normal for age. No joint deformities or effusions. Extremities: No clubbing or cyanosis. No edema.  Distal pedal pulses are 2+ bilaterally. Radial cath site stable without ecchymosis or evidence of a hematoma.  Neuro: Alert and oriented X 3. Moves all extremities spontaneously. No focal deficits noted. Psych:  Responds to questions appropriately with a normal affect. Skin: No rashes or lesions noted  Wt Readings from Last 3 Encounters:  01/15/18 210 lb (95.3 kg)  12/27/17 203 lb 14.4 oz (92.5 kg)  08/25/15 210 lb (95.3 kg)     Studies/Labs Reviewed:    EKG:  EKG is ordered today.  The ekg ordered today demonstrates NSR, HR 74, with diffuse TWI along inferior and lateral leads.   Recent Labs: 12/28/2017: BUN 14; Creatinine, Ser 1.19; Hemoglobin 14.7; Platelets 136; Potassium 3.9; Sodium 135   Lipid Panel    Component Value Date/Time   CHOL 133 12/27/2017 0212   TRIG 55 12/27/2017 0212   HDL 45 12/27/2017 0212   CHOLHDL 3.0 12/27/2017 0212   VLDL 11 12/27/2017 0212   LDLCALC 77 12/27/2017 0212    Additional studies/ records that were reviewed today include:   Cardiac Catheterization: 12/27/2017 Conclusions: 1. Severe 2-vessel coronary artery disease, including sequential 80% and 40% proximal and mid LAD stenoses, as well as 50% ostial and 100% distal RCA lesions. 2. Moderate disease involving LCx and dominant OM branch. 3. Collateralization of distal RCA branches and appearance of thrombus in the distal RCA suggests subacute thrombosis. 4. Mildly elevated left ventricular filling pressure. 5. Moderately elevated LVOT gradient, likely due to aortic stenosis. 6. Basal and mid inferior hypokinesis with otherwise preserved left ventricular contraction. 7. Successful PCI to the proximal LAD using Resolute Onyx 3.0 x 18 mm drug-eluting stent with 0% residual stenosis and TIMI-3 flow.  Recommendations: 1. Medical therapy for moderate LCx disease and occluded distal RCA with left-to-right collaterals. 2. Dual antiplatelet therapy with aspirin and ticagrelor for at least 12 months. 3. Aggressive secondary prevention. 4. Obtain echocardiogram for further evaluation of suspect aortic stenosis.  Echocardiogram: 12/27/2017 Study Conclusions  - Left ventricle: The cavity size was normal. Systolic function was   normal. The estimated ejection fraction was in the range of 55%   to 60%. Wall motion was normal; there were no regional wall   motion abnormalities. Features are consistent with a pseudonormal   left ventricular filling pattern,  with concomitant abnormal   relaxation and increased filling pressure (grade 2 diastolic   dysfunction). - Aortic valve: Trileaflet; moderately thickened, moderately   calcified leaflets. Valve mobility was restricted. There was   moderate stenosis. There was mild regurgitation. Peak velocity   (S): 339 cm/s. Mean gradient (S): 28 mm Hg. - Mitral valve: Calcified annulus. - Left atrium: The atrium was moderately dilated. - Right ventricle: The cavity size was mildly dilated. Wall  thickness was normal. - Right atrium: The atrium was mildly dilated.  Assessment:    1. Non-ST elevation myocardial infarction (NSTEMI), subsequent episode of care ([place])   2. Coronary artery disease involving native coronary artery of native heart without angina pectoris   3. Moderate aortic stenosis   4. Dyslipidemia, goal LDL below 70   5. History of tobacco use      Plan:   In order of problems listed above:  1. Subsequent Episode of Care for NSTEMI/ CAD - recently admitted for chest pain, found to have an NSTEMI with peak troponin value of 2.13. Catheterization showed severe two-vessel CAD including sequential 80% and 40% proximal and mid LAD stenoses as well as 100% occlusion of the distal RCA with left to right collaterals.He underwent successful PCI to the proximal LAD using a Resolute DES. - He denies any recent chest pain or dyspnea on exertion since hospital discharge.  - will continue on DAPT with ASA and Brilinta for at least 12 months. I advised his wife to check on the co-pay for Brilinta. If this is unaffordable, would recommend switching to Plavix 75 mg daily. Continue statin therapy. Not on a beta-blocker due to history of bradycardia in the hospital and hypotension (BP is soft at 100/58 during today's visit).  2. Aortic Stenosis - Moderate by most recent echocardiogram.  Will continue to follow.  3. HLD - Followed by PCP. FLP during recent admission showed total cholesterol of 133,  triglycerides 55, HDL 45, and LDL 77. Atorvastatin was increased to 80 mg daily during his recent hospitalization. Will need repeat FLP and LFT's in 6-8 weeks.  4. Tobacco Use - He was previously smoking 2 ppd prior to his MI but has since quit smoking. Was congratulated on this with continued cessation advised.    Medication Adjustments/Labs and Tests Ordered: Current medicines are reviewed at length with the patient today.  Concerns regarding medicines are outlined above.  Medication changes, Labs and Tests ordered today are listed in the Patient Instructions below. Patient Instructions  Medication Instructions:  Your physician recommends that you continue on your current medications as directed. Please refer to the Current Medication list given to you today.  Labwork: NONE   Testing/Procedures: NONE   Follow-Up: Your physician recommends that you schedule a follow-up appointment in: 3 Months   Any Other Special Instructions Will Be Listed Below (If Applicable).   If you need a refill on your cardiac medications before your next appointment, please call your pharmacy. Thank you for choosing [facility]!     Signed, Erma Heritage, PA-C  01/15/2018 4:48 PM    [address] [place], [place] [address] Phone: [phone]

## 2018-01-15 ENCOUNTER — Ambulatory Visit (INDEPENDENT_AMBULATORY_CARE_PROVIDER_SITE_OTHER): Payer: Medicare Other | Admitting: Student

## 2018-01-15 ENCOUNTER — Encounter: Payer: Self-pay | Admitting: Student

## 2018-01-15 VITALS — BP 100/58 | HR 79 | Ht 70.5 in | Wt 210.0 lb

## 2018-01-15 DIAGNOSIS — I6523 Occlusion and stenosis of bilateral carotid arteries: Secondary | ICD-10-CM

## 2018-01-15 DIAGNOSIS — Z87891 Personal history of nicotine dependence: Secondary | ICD-10-CM

## 2018-01-15 DIAGNOSIS — I350 Nonrheumatic aortic (valve) stenosis: Secondary | ICD-10-CM | POA: Diagnosis not present

## 2018-01-15 DIAGNOSIS — I214 Non-ST elevation (NSTEMI) myocardial infarction: Secondary | ICD-10-CM

## 2018-01-15 DIAGNOSIS — E785 Hyperlipidemia, unspecified: Secondary | ICD-10-CM

## 2018-01-15 DIAGNOSIS — I2510 Atherosclerotic heart disease of native coronary artery without angina pectoris: Secondary | ICD-10-CM | POA: Diagnosis not present

## 2018-01-15 MED ORDER — TICAGRELOR 90 MG PO TABS: 90.0000 mg | ORAL_TABLET | Freq: Two times a day (BID) | ORAL | 3 refills | Status: DC

## 2018-01-15 NOTE — Patient Instructions (Signed)
Medication Instructions:  Your physician recommends that you continue on your current medications as directed. Please refer to the Current Medication list given to you today.   Labwork: NONE   Testing/Procedures: NONE   Follow-Up: Your physician recommends that you schedule a follow-up appointment in: 3 Months    Any Other Special Instructions Will Be Listed Below (If Applicable).     If you need a refill on your cardiac medications before your next appointment, please call your pharmacy.  Thank you for choosing [facility]!   

## 2018-01-21 ENCOUNTER — Telehealth: Payer: Self-pay | Admitting: Student

## 2018-01-21 NOTE — Telephone Encounter (Signed)
Patient's wife would like to speak with Brittany's nurse regarding price of Brilinta/tg

## 2018-01-23 NOTE — Telephone Encounter (Signed)
Spoke with wife regarding cost of Brilinta. Wife will bring proof of income for patient assistance. She will also pick up samples in the morning. If unable to quality for patient assistance wife would like to consider another medication.

## 2018-01-23 NOTE — Telephone Encounter (Signed)
Thank you for the update. If he does not qualify for patient assistance, we can switch to Plavix 75mg  daily.   Signed, Erma Heritage, PA-C 01/23/2018, 4:30 PM Pager: [phone]

## 2018-01-23 NOTE — Telephone Encounter (Signed)
Pt's wife returned call. She stated that she checked with her pharmacy and the cost of the Brilinta is $188.00 per month. She stated that they cannot afford this each month. I explained to pt's wife that I will have Alda Berthold return her call tomorrow as I am not sure what has been discussed about the medication thus far. She voiced understanding and looks forward to the call from [facility], LPN on Friday 3/00.

## 2018-02-18 ENCOUNTER — Telehealth: Payer: Self-pay | Admitting: *Deleted

## 2018-02-18 NOTE — Telephone Encounter (Signed)
Called [place] patient does not qualify for patient for patient assistance because he has private Rx insurance. Patient given 2 week samples for Brilinta and given a copay card.

## 2018-02-28 ENCOUNTER — Telehealth: Payer: Self-pay | Admitting: Cardiology

## 2018-02-28 NOTE — Telephone Encounter (Signed)
Per phone call from pt's wife-- pt is out of his ticagrelor (BRILINTA) 90 MG TABS tablet [[id number]]  And has been denied his assistance.

## 2018-02-28 NOTE — Telephone Encounter (Signed)
Pt wife says Clinical cytogeneticist and they would pay out of pocket at this time so that pt would not miss a dose. Pt wife also made aware that per 4/16 was noted by [place], PA that if pt was denied assistance could be switched to plavix 75 mg daily. Pt wife says at this time pt would rather stay on Brilinta and would call back if/when they wanted to switch. Pt appreciative of call - will forward to nurse who has been following this

## 2018-03-12 ENCOUNTER — Ambulatory Visit (INDEPENDENT_AMBULATORY_CARE_PROVIDER_SITE_OTHER): Payer: Medicare Other | Admitting: Urology

## 2018-03-12 DIAGNOSIS — R3912 Poor urinary stream: Secondary | ICD-10-CM

## 2018-03-12 DIAGNOSIS — R351 Nocturia: Secondary | ICD-10-CM | POA: Diagnosis not present

## 2018-05-01 ENCOUNTER — Ambulatory Visit (INDEPENDENT_AMBULATORY_CARE_PROVIDER_SITE_OTHER): Payer: Medicare Other | Admitting: Cardiology

## 2018-05-01 ENCOUNTER — Encounter: Payer: Self-pay | Admitting: Cardiology

## 2018-05-01 ENCOUNTER — Ambulatory Visit: Payer: Medicare Other | Admitting: Cardiology

## 2018-05-01 ENCOUNTER — Other Ambulatory Visit: Payer: Self-pay

## 2018-05-01 VITALS — BP 125/70 | HR 61 | Ht 71.0 in | Wt 215.0 lb

## 2018-05-01 DIAGNOSIS — D225 Melanocytic nevi of trunk: Secondary | ICD-10-CM | POA: Diagnosis not present

## 2018-05-01 DIAGNOSIS — Z08 Encounter for follow-up examination after completed treatment for malignant neoplasm: Secondary | ICD-10-CM | POA: Diagnosis not present

## 2018-05-01 DIAGNOSIS — E782 Mixed hyperlipidemia: Secondary | ICD-10-CM | POA: Diagnosis not present

## 2018-05-01 DIAGNOSIS — I350 Nonrheumatic aortic (valve) stenosis: Secondary | ICD-10-CM | POA: Diagnosis not present

## 2018-05-01 DIAGNOSIS — Z85828 Personal history of other malignant neoplasm of skin: Secondary | ICD-10-CM | POA: Diagnosis not present

## 2018-05-01 DIAGNOSIS — L570 Actinic keratosis: Secondary | ICD-10-CM | POA: Diagnosis not present

## 2018-05-01 DIAGNOSIS — I2510 Atherosclerotic heart disease of native coronary artery without angina pectoris: Secondary | ICD-10-CM

## 2018-05-01 DIAGNOSIS — I6523 Occlusion and stenosis of bilateral carotid arteries: Secondary | ICD-10-CM

## 2018-05-01 DIAGNOSIS — X32XXXD Exposure to sunlight, subsequent encounter: Secondary | ICD-10-CM | POA: Diagnosis not present

## 2018-05-01 NOTE — Progress Notes (Signed)
Clinical Summary Mr. Faux is a 72 y.o.male seen for follow up, last seen by PA Strader. This is our first visit together.  1. CAD - admit 12/2017 with chest pain, ruled in for NSTEMI - received DES to LAD. Residual RCA CTO and moderate LCX disease managed medically.  - 12/2017 echo LVEF 55-60%, no WMAs, grade II diastolic dysfunction. modeare AS - has not been on beta blocker due to bradycardia.   - no recent chest pai - no SOB or DOE  2. Aortic stenosis - 12/2017 moderate AS by echo: mean grad 28, AVA VTI 0.81, dimensionless index 0.24. Criteria would suggest moderate to severe AS.  - no recent symptoms.   3. Hyperlipidemia - 12/2017 TC 133 TG 55 HDL 45 LDL 77 - compliant with statin.   Past Medical History:  Diagnosis Date  . CAD (coronary artery disease)    a. 12/2017: s/p NSTEMI with DES to Proximal LAD. CTO of RCA with L--> R collaterals noted.   . Hypercholesteremia      No Known Allergies   Current Outpatient Medications  Medication Sig Dispense Refill  . acetaminophen (TYLENOL) 325 MG tablet Take 2 tablets (650 mg total) by mouth every 6 (six) hours as needed for mild pain or headache.    . alfuzosin (UROXATRAL) 10 MG 24 hr tablet Take 10 mg by mouth daily.     Marland Kitchen aspirin EC 81 MG EC tablet Take 1 tablet (81 mg total) by mouth daily.    Marland Kitchen atorvastatin (LIPITOR) 80 MG tablet Take 1 tablet (80 mg total) by mouth daily at 6 PM. 90 tablet 3  . nitroGLYCERIN (NITROSTAT) 0.4 MG SL tablet Place 1 tablet (0.4 mg total) under the tongue every 5 (five) minutes x 3 doses as needed for chest pain. 25 tablet 3  . ticagrelor (BRILINTA) 90 MG TABS tablet Take 1 tablet (90 mg total) by mouth 2 (two) times daily. 180 tablet 3   No current facility-administered medications for this visit.      Past Surgical History:  Procedure Laterality Date  . CATARACT EXTRACTION W/PHACO Left 03/03/2015   Procedure: CATARACT EXTRACTION PHACO AND INTRAOCULAR LENS PLACEMENT (IOC);  Surgeon:  Baruch Goldmann, MD;  Location: [facility];  Service: Ophthalmology;  Laterality: Left;  CDE 6.86  . CATARACT EXTRACTION W/PHACO Right 08/31/2015   Procedure: CATARACT EXTRACTION PHACO AND INTRAOCULAR LENS PLACEMENT RIGHT EYE CDE=4.08;  Surgeon: Baruch Goldmann, MD;  Location: [facility];  Service: Ophthalmology;  Laterality: Right;  . COLONOSCOPY  11/05/2011   Procedure: COLONOSCOPY;  Surgeon: Daneil Dolin, MD;  [facility];  Service: Endoscopy;  Laterality: N/A;  7:30 AM  . CORONARY STENT INTERVENTION N/A 12/27/2017   Procedure: CORONARY STENT INTERVENTION;  Surgeon: Nelva Bush, MD;  Location: [place] [facility];  Service: Cardiovascular;  Laterality: N/A;  . LEFT HEART CATH AND CORONARY ANGIOGRAPHY N/A 12/27/2017   Procedure: LEFT HEART CATH AND CORONARY ANGIOGRAPHY;  Surgeon: Nelva Bush, MD;  Location: [place] [facility];  Service: Cardiovascular;  Laterality: N/A;  . NO PAST SURGERIES       No Known Allergies    Family History  Problem Relation Age of Onset  . Hypertension Father   . Colon cancer Neg Hx      Social History Mr. Jarchow reports that he has been smoking cigarettes.  He has a 58.50 pack-year smoking history. He has never used smokeless tobacco. Mr. Cieslinski reports that he does not drink alcohol.  Review of Systems CONSTITUTIONAL: No weight loss, fever, chills, weakness or fatigue.  HEENT: Eyes: No visual loss, blurred vision, double vision or yellow sclerae.No hearing loss, sneezing, congestion, runny nose or sore throat.  SKIN: No rash or itching.  CARDIOVASCULAR: per hpi RESPIRATORY: No shortness of breath, cough or sputum.  GASTROINTESTINAL: No anorexia, nausea, vomiting or diarrhea. No abdominal pain or blood.  GENITOURINARY: No burning on urination, no polyuria NEUROLOGICAL: No headache, dizziness, syncope, paralysis, ataxia, numbness or tingling in the extremities. No change in bowel or bladder control.  MUSCULOSKELETAL: No muscle, back pain,  joint pain or stiffness.  LYMPHATICS: No enlarged nodes. No history of splenectomy.  PSYCHIATRIC: No history of depression or anxiety.  ENDOCRINOLOGIC: No reports of sweating, cold or heat intolerance. No polyuria or polydipsia.  Marland Kitchen   Physical Examination Vitals:   05/01/18 0931  BP: 125/70  Pulse: 61  SpO2: 98%   Vitals:   05/01/18 0931  Weight: 215 lb (97.5 kg)  Height: 5\' 11"  (1.803 m)    Gen: resting comfortably, no acute distress HEENT: no scleral icterus, pupils equal round and reactive, no palptable cervical adenopathy,  CV: RRR, no m/r/g, no jvd Resp: Clear to auscultation bilaterally GI: abdomen is soft, non-tender, non-distended, normal bowel sounds, no hepatosplenomegaly MSK: extremities are warm, no edema.  Skin: warm, no rash Neuro:  no focal deficits Psych: appropriate affect   Diagnostic Studies 12/2017 echo Study Conclusions  - Left ventricle: The cavity size was normal. Systolic function was   normal. The estimated ejection fraction was in the range of 55%   to 60%. Wall motion was normal; there were no regional wall   motion abnormalities. Features are consistent with a pseudonormal   left ventricular filling pattern, with concomitant abnormal   relaxation and increased filling pressure (grade 2 diastolic   dysfunction). - Aortic valve: Trileaflet; moderately thickened, moderately   calcified leaflets. Valve mobility was restricted. There was   moderate stenosis. There was mild regurgitation. Peak velocity   (S): 339 cm/s. Mean gradient (S): 28 mm Hg. - Mitral valve: Calcified annulus. - Left atrium: The atrium was moderately dilated. - Right ventricle: The cavity size was mildly dilated. Wall   thickness was normal. - Right atrium: The atrium was mildly dilated.  12/2017 cath Conclusions: 1. Severe 2-vessel coronary artery disease, including sequential 80% and 40% proximal and mid LAD stenoses, as well as 50% ostial and 100% distal RCA  lesions. 2. Moderate disease involving LCx and dominant OM branch. 3. Collateralization of distal RCA branches and appearance of thrombus in the distal RCA suggests subacute thrombosis. 4. Mildly elevated left ventricular filling pressure. 5. Moderately elevated LVOT gradient, likely due to aortic stenosis. 6. Basal and mid inferior hypokinesis with otherwise preserved left ventricular contraction. 7. Successful PCI to the proximal LAD using Resolute Onyx 3.0 x 18 mm drug-eluting stent with 0% residual stenosis and TIMI-3 flow.  Post cath Recommendations: 1. Medical therapy for moderate LCx disease and occluded distal RCA with left-to-right collaterals. 2. Dual antiplatelet therapy with aspirin and ticagrelor for at least 12 months. 3. Aggressive secondary prevention. 4. Obtain echocardiogram for further evaluation of suspect aortic stenosis.  12/2017 carotid US Bilateral 1-39% ICA disease    Assessment and Plan  1. CAD - no recent symptoms - continue current meds including DAPT at least until 12/2018   2. Aortic stenosis - moderate to severe by echo, asymptomatic - continue to monitor at this time. Repeat echo early next year or  if symptomatic  3. Hyperlipidemia - continue current statin, at goal   F/u 6 months    Arnoldo Lenis, M.D.

## 2018-05-01 NOTE — Patient Instructions (Signed)

## 2018-05-12 ENCOUNTER — Encounter: Payer: Self-pay | Admitting: Cardiology

## 2018-09-17 ENCOUNTER — Ambulatory Visit (INDEPENDENT_AMBULATORY_CARE_PROVIDER_SITE_OTHER): Payer: Medicare Other | Admitting: Urology

## 2018-09-17 DIAGNOSIS — R3912 Poor urinary stream: Secondary | ICD-10-CM | POA: Diagnosis not present

## 2018-09-17 DIAGNOSIS — R351 Nocturia: Secondary | ICD-10-CM

## 2018-10-22 DIAGNOSIS — L570 Actinic keratosis: Secondary | ICD-10-CM | POA: Diagnosis not present

## 2018-10-22 DIAGNOSIS — X32XXXD Exposure to sunlight, subsequent encounter: Secondary | ICD-10-CM | POA: Diagnosis not present

## 2018-10-23 ENCOUNTER — Encounter: Payer: Self-pay | Admitting: Cardiology

## 2018-10-23 ENCOUNTER — Ambulatory Visit (INDEPENDENT_AMBULATORY_CARE_PROVIDER_SITE_OTHER): Payer: Medicare Other | Admitting: Cardiology

## 2018-10-23 ENCOUNTER — Telehealth: Payer: Self-pay | Admitting: Cardiology

## 2018-10-23 VITALS — BP 120/72 | HR 60 | Ht 71.0 in | Wt 214.4 lb

## 2018-10-23 DIAGNOSIS — I2510 Atherosclerotic heart disease of native coronary artery without angina pectoris: Secondary | ICD-10-CM | POA: Diagnosis not present

## 2018-10-23 DIAGNOSIS — E782 Mixed hyperlipidemia: Secondary | ICD-10-CM

## 2018-10-23 DIAGNOSIS — I350 Nonrheumatic aortic (valve) stenosis: Secondary | ICD-10-CM | POA: Diagnosis not present

## 2018-10-23 NOTE — Progress Notes (Signed)
Clinical Summary Jerry Roach is a 73 y.o.male seen today for follow up of the following medical problems.  1. CAD - admit 12/2017 with chest pain, ruled in for NSTEMI - received DES to LAD. Residual RCA CTO and moderate LCX disease managed medically.  - 12/2017 echo LVEF 55-60%, no WMAs, grade II diastolic dysfunction. modeare AS - has not been on beta blocker due to bradycardia. Has had some prior renal insufficency has not been on ACE-I.    - no recent chest pain. No recent SOB/DOE - compliant with meds.   2. Aortic stenosis - 12/2017 moderate AS by echo: mean grad 28, AVA VTI 0.81, dimensionless index 0.24. Criteria would suggest moderate to severe AS.   - denies any symptoms since our last visit   3. Hyperlipidemia - 12/2017 TC 133 TG 55 HDL 45 LDL 77  - he remains compliant with statin.     Past Medical History:  Diagnosis Date  . CAD (coronary artery disease)    a. 12/2017: s/p NSTEMI with DES to Proximal LAD. CTO of RCA with L--> R collaterals noted.   . Hypercholesteremia      No Known Allergies   Current Outpatient Medications  Medication Sig Dispense Refill  . acetaminophen (TYLENOL) 325 MG tablet Take 2 tablets (650 mg total) by mouth every 6 (six) hours as needed for mild pain or headache.    . alfuzosin (UROXATRAL) 10 MG 24 hr tablet Take 10 mg by mouth daily.     Marland Kitchen aspirin EC 81 MG EC tablet Take 1 tablet (81 mg total) by mouth daily.    Marland Kitchen atorvastatin (LIPITOR) 80 MG tablet Take 1 tablet (80 mg total) by mouth daily at 6 PM. 90 tablet 3  . nitroGLYCERIN (NITROSTAT) 0.4 MG SL tablet Place 1 tablet (0.4 mg total) under the tongue every 5 (five) minutes x 3 doses as needed for chest pain. 25 tablet 3  . ticagrelor (BRILINTA) 90 MG TABS tablet Take 1 tablet (90 mg total) by mouth 2 (two) times daily. 180 tablet 3   No current facility-administered medications for this visit.      Past Surgical History:  Procedure Laterality Date  . CATARACT  EXTRACTION W/PHACO Left 03/03/2015   Procedure: CATARACT EXTRACTION PHACO AND INTRAOCULAR LENS PLACEMENT (IOC);  Surgeon: Baruch Goldmann, MD;  Location: [facility];  Service: Ophthalmology;  Laterality: Left;  CDE 6.86  . CATARACT EXTRACTION W/PHACO Right 08/31/2015   Procedure: CATARACT EXTRACTION PHACO AND INTRAOCULAR LENS PLACEMENT RIGHT EYE CDE=4.08;  Surgeon: Baruch Goldmann, MD;  Location: [facility];  Service: Ophthalmology;  Laterality: Right;  . COLONOSCOPY  11/05/2011   Procedure: COLONOSCOPY;  Surgeon: Daneil Dolin, MD;  Location: [facility];  Service: Endoscopy;  Laterality: N/A;  7:30 AM  . CORONARY STENT INTERVENTION N/A 12/27/2017   Procedure: CORONARY STENT INTERVENTION;  Surgeon: Nelva Bush, MD;  Location: [place] [facility];  Service: Cardiovascular;  Laterality: N/A;  . LEFT HEART CATH AND CORONARY ANGIOGRAPHY N/A 12/27/2017   Procedure: LEFT HEART CATH AND CORONARY ANGIOGRAPHY;  Surgeon: Nelva Bush, MD;  Location: [place] [facility];  Service: Cardiovascular;  Laterality: N/A;  . NO PAST SURGERIES       No Known Allergies    Family History  Problem Relation Age of Onset  . Hypertension Father   . Colon cancer Neg Hx      Social History Mr. Dobler reports that he quit smoking about 9 months ago. His smoking  use included cigarettes. He has a 58.50 pack-year smoking history. He has never used smokeless tobacco. Mr. Nack reports no history of alcohol use.   Review of Systems CONSTITUTIONAL: No weight loss, fever, chills, weakness or fatigue.  HEENT: Eyes: No visual loss, blurred vision, double vision or yellow sclerae.No hearing loss, sneezing, congestion, runny nose or sore throat.  SKIN: No rash or itching.  CARDIOVASCULAR: per hpi RESPIRATORY: No shortness of breath, cough or sputum.  GASTROINTESTINAL: No anorexia, nausea, vomiting or diarrhea. No abdominal pain or blood.  GENITOURINARY: No burning on urination, no polyuria NEUROLOGICAL: No headache,  dizziness, syncope, paralysis, ataxia, numbness or tingling in the extremities. No change in bowel or bladder control.  MUSCULOSKELETAL: No muscle, back pain, joint pain or stiffness.  LYMPHATICS: No enlarged nodes. No history of splenectomy.  PSYCHIATRIC: No history of depression or anxiety.  ENDOCRINOLOGIC: No reports of sweating, cold or heat intolerance. No polyuria or polydipsia.  Marland Kitchen   Physical Examination Vitals:   10/23/18 0947  BP: 120/72  Pulse: 60  SpO2: 98%   Vitals:   10/23/18 0947  Weight: 214 lb 6.4 oz (97.3 kg)  Height: 5\' 11"  (1.803 m)    Gen: resting comfortably, no acute distress HEENT: no scleral icterus, pupils equal round and reactive, no palptable cervical adenopathy,  CV: RRR, 3/6 systolic murmur rusb, no jvd Resp: Clear to auscultation bilaterally GI: abdomen is soft, non-tender, non-distended, normal bowel sounds, no hepatosplenomegaly MSK: extremities are warm, no edema.  Skin: warm, no rash Neuro:  no focal deficits Psych: appropriate affect   Diagnostic Studies  12/2017 echo Study Conclusions  - Left ventricle: The cavity size was normal. Systolic function was normal. The estimated ejection fraction was in the range of 55% to 60%. Wall motion was normal; there were no regional wall motion abnormalities. Features are consistent with a pseudonormal left ventricular filling pattern, with concomitant abnormal relaxation and increased filling pressure (grade 2 diastolic dysfunction). - Aortic valve: Trileaflet; moderately thickened, moderately calcified leaflets. Valve mobility was restricted. There was moderate stenosis. There was mild regurgitation. Peak velocity (S): 339 cm/s. Mean gradient (S): 28 mm Hg. - Mitral valve: Calcified annulus. - Left atrium: The atrium was moderately dilated. - Right ventricle: The cavity size was mildly dilated. Wall thickness was normal. - Right atrium: The atrium was mildly  dilated.  12/2017 cath Conclusions: 1. Severe 2-vessel coronary artery disease, including sequential 80% and 40% proximal and mid LAD stenoses, as well as 50% ostial and 100% distal RCA lesions. 2. Moderate disease involving LCx and dominant OM Kandee Escalante. 3. Collateralization of distal RCA branches and appearance of thrombus in the distal RCA suggests subacute thrombosis. 4. Mildly elevated left ventricular filling pressure. 5. Moderately elevated LVOT gradient, likely due to aortic stenosis. 6. Basal and mid inferior hypokinesis with otherwise preserved left ventricular contraction. 7. Successful PCI to the proximal LAD using Resolute Onyx 3.0 x 18 mm drug-eluting stent with 0% residual stenosis and TIMI-3 flow.  Post cath Recommendations: 1. Medical therapy for moderate LCx disease and occluded distal RCA with left-to-right collaterals. 2. Dual antiplatelet therapy with aspirin and ticagrelor for at least 12 months. 3. Aggressive secondary prevention. 4. Obtain echocardiogram for further evaluation of suspect aortic stenosis.  12/2017 carotid US Bilateral 1-39% ICA disease   Assessment and Plan  1. CAD -no recent symptoms, he will stop his brillinta 12/28/18, continue other meds   2. Aortic stenosis - moderate to severe by echo, asymptomatic - we will repeat echo  in 12/2018 - counseled on symptoms to monitor for and to notify us  3. Hyperlipidemia - continue statin, he is to arrange f/u with pcp for physical and annual labs   F/u 6 months  Arnoldo Lenis, M.D.

## 2018-10-23 NOTE — Patient Instructions (Signed)
Your physician wants you to follow-up in: [place] will receive a reminder letter in the mail two months in advance. If you don't receive a letter, please call our office to schedule the follow-up appointment.  Your physician has recommended you make the following change in your medication:   STOP BRILINTA 12/28/2018  Your physician has requested that you have an echocardiogram IN [facility]. Echocardiography is a painless test that uses sound waves to create images of your heart. It provides your doctor with information about the size and shape of your heart and how well your heart's chambers and valves are working. This procedure takes approximately one hour. There are no restrictions for this procedure.  Thank you for choosing [facility]!!

## 2018-10-23 NOTE — Telephone Encounter (Signed)
°  Precert needed for:  Carotid & echo    Location: [facility]    Date: December 31, 2018

## 2018-11-07 DIAGNOSIS — N030 Chronic nephritic syndrome with minor glomerular abnormality: Secondary | ICD-10-CM | POA: Diagnosis not present

## 2018-11-07 DIAGNOSIS — Z1389 Encounter for screening for other disorder: Secondary | ICD-10-CM | POA: Diagnosis not present

## 2018-11-07 DIAGNOSIS — Z6830 Body mass index (BMI) 30.0-30.9, adult: Secondary | ICD-10-CM | POA: Diagnosis not present

## 2018-11-07 DIAGNOSIS — Z125 Encounter for screening for malignant neoplasm of prostate: Secondary | ICD-10-CM | POA: Diagnosis not present

## 2018-11-07 DIAGNOSIS — I214 Non-ST elevation (NSTEMI) myocardial infarction: Secondary | ICD-10-CM | POA: Diagnosis not present

## 2018-11-07 DIAGNOSIS — E039 Hypothyroidism, unspecified: Secondary | ICD-10-CM | POA: Diagnosis not present

## 2018-11-07 DIAGNOSIS — Z0001 Encounter for general adult medical examination with abnormal findings: Secondary | ICD-10-CM | POA: Diagnosis not present

## 2018-11-07 DIAGNOSIS — E6609 Other obesity due to excess calories: Secondary | ICD-10-CM | POA: Diagnosis not present

## 2018-12-30 ENCOUNTER — Encounter (HOSPITAL_COMMUNITY): Payer: Medicare Other

## 2018-12-31 ENCOUNTER — Other Ambulatory Visit: Payer: Medicare Other

## 2018-12-31 ENCOUNTER — Other Ambulatory Visit: Payer: Self-pay | Admitting: Cardiology

## 2018-12-31 MED ORDER — ATORVASTATIN CALCIUM 80 MG PO TABS: 80.0000 mg | ORAL_TABLET | Freq: Every day | ORAL | 3 refills | Status: DC

## 2018-12-31 NOTE — Telephone Encounter (Signed)
Needs atorvastatin refill sent to [facility] . tg

## 2018-12-31 NOTE — Telephone Encounter (Signed)
Refilled atorvastatin per request

## 2019-01-28 ENCOUNTER — Other Ambulatory Visit: Payer: Medicare Other

## 2019-01-30 ENCOUNTER — Other Ambulatory Visit: Payer: Self-pay | Admitting: Cardiology

## 2019-01-30 ENCOUNTER — Other Ambulatory Visit: Payer: Self-pay | Admitting: Student

## 2019-01-30 MED ORDER — TICAGRELOR 90 MG PO TABS: 90.0000 mg | ORAL_TABLET | Freq: Two times a day (BID) | ORAL | 3 refills | Status: DC

## 2019-01-30 NOTE — Telephone Encounter (Signed)
This is Dr. Harl Bowie pt

## 2019-01-30 NOTE — Telephone Encounter (Signed)
Done

## 2019-01-30 NOTE — Telephone Encounter (Signed)
ticagrelor (BRILINTA) 90 MG TABS tablet [[id number]] needing refill, pt has one left to take tonight and he will be out.

## 2019-02-09 NOTE — Telephone Encounter (Signed)
Ok to stop brillinta, continue aspirin alone   Zandra Abts MD

## 2019-03-11 ENCOUNTER — Telehealth: Payer: Self-pay | Admitting: Cardiology

## 2019-03-11 NOTE — Telephone Encounter (Signed)
° ° °  COVID-19 Pre-Screening Questions:   In the past 7 to 10 days have you had a cough,  shortness of breath, headache, congestion, fever (100 or greater) body aches, chills, sore throat, or sudden loss of taste or sense of smell? NO  Have you been around anyone with known Covid 19. No  Have you been around anyone who is awaiting Covid 19 test results in the past 7 to 10 days? No  Have you been around anyone who has been exposed to Covid 19, or has mentioned symptoms of Covid 19 within the past 7 to 10 days? No    Patient informed to wear mask, no visitors allowed in building with them, and that they will be screened at door before entering.  If you have any concerns/questions about symptoms patients report during screening (either on the phone or at threshold). Contact the provider seeing the patient or DOD for further guidance.  If neither are available contact a member of the leadership team.

## 2019-03-12 ENCOUNTER — Ambulatory Visit (INDEPENDENT_AMBULATORY_CARE_PROVIDER_SITE_OTHER): Payer: Medicare Other

## 2019-03-12 DIAGNOSIS — I6523 Occlusion and stenosis of bilateral carotid arteries: Secondary | ICD-10-CM | POA: Diagnosis not present

## 2019-03-12 DIAGNOSIS — I350 Nonrheumatic aortic (valve) stenosis: Secondary | ICD-10-CM

## 2019-03-16 ENCOUNTER — Telehealth: Payer: Self-pay | Admitting: *Deleted

## 2019-03-16 NOTE — Telephone Encounter (Signed)
Wife informed. Copy sent to PCP

## 2019-03-16 NOTE — Telephone Encounter (Signed)
-----   Message from Arnoldo Lenis, MD sent at 03/16/2019  3:28 PM EDT ----- Aortic stenosis remains in the moderate to severe range and overall stable, we will continue to monitor at this time.    Zandra Abts MD

## 2019-03-23 ENCOUNTER — Telehealth: Payer: Self-pay | Admitting: *Deleted

## 2019-03-23 NOTE — Telephone Encounter (Signed)
-----   Message from Arnoldo Lenis, MD sent at 03/23/2019  3:25 PM EDT ----- Carotid US shows just mild blockages on both sides, we will continue to monitor  Zandra Abts MD

## 2019-03-23 NOTE — Telephone Encounter (Signed)
Called patient with test results. Left message to call back.

## 2019-05-06 ENCOUNTER — Ambulatory Visit
Admission: EM | Admit: 2019-05-06 | Discharge: 2019-05-06 | Disposition: A | Discharge status: Home / Self Care | Payer: Medicare Other | Attending: Emergency Medicine | Admitting: Emergency Medicine

## 2019-05-06 ENCOUNTER — Other Ambulatory Visit: Payer: Self-pay

## 2019-05-06 DIAGNOSIS — J0100 Acute maxillary sinusitis, unspecified: Secondary | ICD-10-CM | POA: Diagnosis not present

## 2019-05-06 DIAGNOSIS — J3489 Other specified disorders of nose and nasal sinuses: Secondary | ICD-10-CM

## 2019-05-06 MED ORDER — AMOXICILLIN-POT CLAVULANATE 875-125 MG PO TABS: 1.0000 | ORAL_TABLET | Freq: Two times a day (BID) | ORAL | 0 refills | Status: AC

## 2019-05-06 NOTE — ED Triage Notes (Signed)
Pt describes sinus pressure for past 10 days, denies drainage and pain but describes numbness and mild pressure

## 2019-05-06 NOTE — Discharge Instructions (Signed)
Rest and push fluids Continue with OTC medications at needed Augmentin prescribed.  Take as directed and to completion Continue with OTC ibuprofen/tylenol as needed for pain Follow up with PCP or return here in 2-3 days if symptoms are not improving with medications Return or go to the ED if you have any new or worsening symptoms such as fever, chills, worsening sinus pain/pressure, cough, sore throat, chest pain, shortness of breath, abdominal pain, changes in bowel or bladder habits, facial droop, slurred speech, weakness in arms or legs, etc..Marland Kitchen

## 2019-05-06 NOTE — ED Provider Notes (Addendum)
[facility]   [id number] 05/06/19 Arrival Time: 8841   CC: Sinus pressure  SUBJECTIVE: History from: patient.  Jerry Roach is a 73 y.o. male who presents with sinus pain, pressure and "puffiness" x 7 days.  Denies sick exposure to COVID, flu or strep.  Denies recent travel.  Localizes "puffiness" to RT frontal sinus, and pain to RT maxillary sinus.  Pain is intermittent and 2/10.  Has tried OTC medications without relief.  Denies aggravating factors.  Denies previous symptoms in the past.   Complains of mild rhinorrhea.  Denies fever, chills, fatigue, sore throat, SOB, wheezing, chest pain, nausea, changes in bowel or bladder habits, facial asymmetries, slurred speech, weakness in arms or legs, numbness or tingling in arms or legs.   ROS: As per HPI.  All other pertinent ROS negative.     Past Medical History:  Diagnosis Date  . CAD (coronary artery disease)    a. 12/2017: s/p NSTEMI with DES to Proximal LAD. CTO of RCA with L--> R collaterals noted.   . Hypercholesteremia    Past Surgical History:  Procedure Laterality Date  . CATARACT EXTRACTION W/PHACO Left 03/03/2015   Procedure: CATARACT EXTRACTION PHACO AND INTRAOCULAR LENS PLACEMENT (IOC);  Surgeon: Baruch Goldmann, MD;  Location: [facility];  Service: Ophthalmology;  Laterality: Left;  CDE 6.86  . CATARACT EXTRACTION W/PHACO Right 08/31/2015   Procedure: CATARACT EXTRACTION PHACO AND INTRAOCULAR LENS PLACEMENT RIGHT EYE CDE=4.08;  Surgeon: Baruch Goldmann, MD;  Location: [facility];  Service: Ophthalmology;  Laterality: Right;  . COLONOSCOPY  11/05/2011   Procedure: COLONOSCOPY;  Surgeon: Daneil Dolin, MD;  [facility];  Service: Endoscopy;  Laterality: N/A;  7:30 AM  . CORONARY STENT INTERVENTION N/A 12/27/2017   Procedure: CORONARY STENT INTERVENTION;  Surgeon: Nelva Bush, MD;  Location: [place] [facility];  Service: Cardiovascular;  Laterality: N/A;  . LEFT HEART CATH AND CORONARY ANGIOGRAPHY N/A 12/27/2017    Procedure: LEFT HEART CATH AND CORONARY ANGIOGRAPHY;  Surgeon: Nelva Bush, MD;  Location: [place] [facility];  Service: Cardiovascular;  Laterality: N/A;  . NO PAST SURGERIES     No Known Allergies No current facility-administered medications on file prior to encounter.    Current Outpatient Medications on File Prior to Encounter  Medication Sig Dispense Refill  . acetaminophen (TYLENOL) 325 MG tablet Take 2 tablets (650 mg total) by mouth every 6 (six) hours as needed for mild pain or headache.    Marland Kitchen aspirin EC 81 MG EC tablet Take 1 tablet (81 mg total) by mouth daily.    Marland Kitchen atorvastatin (LIPITOR) 80 MG tablet Take 1 tablet (80 mg total) by mouth daily at 6 PM. 90 tablet 3  . nitroGLYCERIN (NITROSTAT) 0.4 MG SL tablet Place 1 tablet (0.4 mg total) under the tongue every 5 (five) minutes x 3 doses as needed for chest pain. 25 tablet 3   Social History   Socioeconomic History  . Marital status: Married    Spouse name: Not on file  . Number of children: Not on file  . Years of education: Not on file  . Highest education level: Not on file  Occupational History  . Not on file  Social Needs  . Financial resource strain: Not on file  . Food insecurity    Worry: Not on file    Inability: Not on file  . Transportation needs    Medical: Not on file    Non-medical: Not on file  Tobacco Use  .  Smoking status: Former Smoker    Packs/day: 1.50    Years: 39.00    Pack years: 58.50    Types: Cigarettes    Quit date: 12/26/2017    Years since quitting: 1.3  . Smokeless tobacco: Never Used  Substance and Sexual Activity  . Alcohol use: No  . Drug use: No  . Sexual activity: Yes    Birth control/protection: None  Lifestyle  . Physical activity    Days per week: Not on file    Minutes per session: Not on file  . Stress: Not on file  Relationships  . Social Herbalist on phone: Not on file    Gets together: Not on file    Attends religious service: Not on file     Active member of club or organization: Not on file    Attends meetings of clubs or organizations: Not on file    Relationship status: Not on file  . Intimate partner violence    Fear of current or ex partner: Not on file    Emotionally abused: Not on file    Physically abused: Not on file    Forced sexual activity: Not on file  Other Topics Concern  . Not on file  Social History Narrative  . Not on file   Family History  Problem Relation Age of Onset  . Hypertension Father   . Colon cancer Neg Hx     OBJECTIVE:  Vitals:   05/06/19 1315  BP: 137/68  Pulse: 65  Resp: 18  Temp: 98.1 F (36.7 C)  SpO2: 95%     General appearance: alert; well-appearing, nontoxic; speaking in full sentences and tolerating own secretions HEENT: NCAT; Ears: EACs clear, TMs pearly gray; Eyes: PERRL.  EOM grossly intact. Sinuses: mildly TTP over RT maxillary sinus; Nose: nares patent without rhinorrhea, Throat: oropharynx clear, tonsils non erythematous or enlarged, uvula midline  Neck: supple without LAD Lungs: unlabored respirations, symmetrical air entry; cough: absent; no respiratory distress; CTAB Heart: soft systolic murmur Skin: warm and dry Neuro: CN 2-12 grossly intact; strength and sensation intact about the upper and lower extremities; negative pronator drift; finger to nose without difficulty; ambulates without difficulty Psychological: alert and cooperative; normal mood and affect  ASSESSMENT & PLAN:  1. Acute non-recurrent maxillary sinusitis   2. Sinus pressure     Meds ordered this encounter  Medications  . amoxicillin-clavulanate (AUGMENTIN) 875-125 MG tablet    Sig: Take 1 tablet by mouth every 12 (twelve) hours for 10 days.    Dispense:  20 tablet    Refill:  0    Order Specific Question:   Supervising Provider    Answer:   Raylene Everts [[id number]]   Rest and push fluids Continue with OTC medications at needed Augmentin prescribed.  Take as directed and to  completion Continue with OTC ibuprofen/tylenol as needed for pain Follow up with PCP or return here in 2-3 days if symptoms are not improving with medications Return or go to the ED if you have any new or worsening symptoms such as fever, chills, worsening sinus pain/pressure, cough, sore throat, chest pain, shortness of breath, abdominal pain, changes in bowel or bladder habits, facial droop, slurred speech, weakness in arms or legs, etc...    Reviewed expectations re: course of current medical issues. Questions answered. Outlined signs and symptoms indicating need for more acute intervention. Patient verbalized understanding. After Visit Summary given.  Lestine Box, PA-C 05/06/19 [place], [place], PA-C 05/06/19 1353

## 2019-05-18 ENCOUNTER — Telehealth (INDEPENDENT_AMBULATORY_CARE_PROVIDER_SITE_OTHER): Payer: Medicare Other | Admitting: Cardiology

## 2019-05-18 ENCOUNTER — Encounter: Payer: Self-pay | Admitting: *Deleted

## 2019-05-18 ENCOUNTER — Encounter: Payer: Self-pay | Admitting: Cardiology

## 2019-05-18 VITALS — BP 126/61 | HR 69 | Ht 71.0 in | Wt 205.0 lb

## 2019-05-18 DIAGNOSIS — I350 Nonrheumatic aortic (valve) stenosis: Secondary | ICD-10-CM

## 2019-05-18 DIAGNOSIS — I6523 Occlusion and stenosis of bilateral carotid arteries: Secondary | ICD-10-CM

## 2019-05-18 DIAGNOSIS — I2510 Atherosclerotic heart disease of native coronary artery without angina pectoris: Secondary | ICD-10-CM

## 2019-05-18 DIAGNOSIS — E782 Mixed hyperlipidemia: Secondary | ICD-10-CM

## 2019-05-18 NOTE — Patient Instructions (Signed)

## 2019-05-18 NOTE — Progress Notes (Signed)
Virtual Visit via Telephone Note   This visit type was conducted due to national recommendations for restrictions regarding the COVID-19 Pandemic (e.g. social distancing) in an effort to limit this patient's exposure and mitigate transmission in our community.  Due to his co-morbid illnesses, this patient is at least at moderate risk for complications without adequate follow up.  This format is felt to be most appropriate for this patient at this time.  The patient did not have access to video technology/had technical difficulties with video requiring transitioning to audio format only (telephone).  All issues noted in this document were discussed and addressed.  No physical exam could be performed with this format.  Please refer to the patient's chart for his  consent to telehealth for [facility].   Date:  05/18/2019   ID:  Jerry Roach, DOB 31-Jan-1946, MRN [id number]  Patient Location: Home Provider Location: Office  PCP:  Redmond School, MD  Cardiologist:  Carlyle Dolly, MD  Electrophysiologist:  None   Evaluation Performed:  Follow-Up Visit  Chief Complaint:  Follow up visit  History of Present Illness:    Jerry Roach is a 73 y.o. male seen today for follow up of the following medical problems.  1. CAD - admit 12/2017 with chest pain, ruled in for NSTEMI - received DES to LAD. Residual RCA CTO and moderate LCX disease managed medically.  - 12/2017 echo LVEF 55-60%, no WMAs, grade II diastolic dysfunction. modeare AS - has not been on beta blocker due to bradycardia.Has had some prior renal insufficency has not been on ACE-I.     - no recent chest pain. Denies SOB/DOE - compliant with meds. Completed his 1 year of brillinta, actually notes breathing improved since stopping brillinta.    2. Aortic stenosis - 12/2017 moderate AS by echo: mean grad 28, AVA VTI 0.81, dimensionless index 0.24.   03/2019 echo LVEF 55-60%, mod to severe AS (mean grad 23, AVA VTI  0.94, dimensionless index 0.28)  - no recent symptoms. [place] active [place]. Doing heavy digging etc.   3. Hyperlipidemia - 12/2017 TC 133 TG 55 HDL 45 LDL 77 - more recent labs with pcp - compliant with statin   4. Carotid stenosis - 03/2019 mild bilateral disease    The patient does not have symptoms concerning for COVID-19 infection (fever, chills, cough, or new shortness of breath).    Past Medical History:  Diagnosis Date  . CAD (coronary artery disease)    a. 12/2017: s/p NSTEMI with DES to Proximal LAD. CTO of RCA with L--> R collaterals noted.   . Hypercholesteremia    Past Surgical History:  Procedure Laterality Date  . CATARACT EXTRACTION W/PHACO Left 03/03/2015   Procedure: CATARACT EXTRACTION PHACO AND INTRAOCULAR LENS PLACEMENT (IOC);  Surgeon: Baruch Goldmann, MD;  Location: [facility];  Service: Ophthalmology;  Laterality: Left;  CDE 6.86  . CATARACT EXTRACTION W/PHACO Right 08/31/2015   Procedure: CATARACT EXTRACTION PHACO AND INTRAOCULAR LENS PLACEMENT RIGHT EYE CDE=4.08;  Surgeon: Baruch Goldmann, MD;  Location: [facility];  Service: Ophthalmology;  Laterality: Right;  . COLONOSCOPY  11/05/2011   Procedure: COLONOSCOPY;  Surgeon: Daneil Dolin, MD;  [facility];  Service: Endoscopy;  Laterality: N/A;  7:30 AM  . CORONARY STENT INTERVENTION N/A 12/27/2017   Procedure: CORONARY STENT INTERVENTION;  Surgeon: Nelva Bush, MD;  Location: [place] [facility];  Service: Cardiovascular;  Laterality: N/A;  . LEFT HEART CATH AND CORONARY ANGIOGRAPHY N/A  12/27/2017   Procedure: LEFT HEART CATH AND CORONARY ANGIOGRAPHY;  Surgeon: Nelva Bush, MD;  Location: [place] [facility];  Service: Cardiovascular;  Laterality: N/A;  . NO PAST SURGERIES       Current Meds  Medication Sig  . acetaminophen (TYLENOL) 325 MG tablet Take 2 tablets (650 mg total) by mouth every 6 (six) hours as needed for mild pain or headache.  . alfuzosin (UROXATRAL) 10 MG 24 hr  tablet Take 10 mg by mouth daily with breakfast.  . aspirin EC 81 MG EC tablet Take 1 tablet (81 mg total) by mouth daily.  Marland Kitchen atorvastatin (LIPITOR) 80 MG tablet Take 1 tablet (80 mg total) by mouth daily at 6 PM.  . nitroGLYCERIN (NITROSTAT) 0.4 MG SL tablet Place 1 tablet (0.4 mg total) under the tongue every 5 (five) minutes x 3 doses as needed for chest pain.     Allergies:   Patient has no known allergies.   Social History   Tobacco Use  . Smoking status: Former Smoker    Packs/day: 1.50    Years: 39.00    Pack years: 58.50    Types: Cigarettes    Quit date: 12/26/2017    Years since quitting: 1.3  . Smokeless tobacco: Never Used  Substance Use Topics  . Alcohol use: No  . Drug use: No     Family Hx: The patient's family history includes Hypertension in his father. There is no history of Colon cancer.  ROS:   Please see the history of present illness.     All other systems reviewed and are negative.   Prior CV studies:   The following studies were reviewed today:  12/2017 echo Study Conclusions  - Left ventricle: The cavity size was normal. Systolic function was normal. The estimated ejection fraction was in the range of 55% to 60%. Wall motion was normal; there were no regional wall motion abnormalities. Features are consistent with a pseudonormal left ventricular filling pattern, with concomitant abnormal relaxation and increased filling pressure (grade 2 diastolic dysfunction). - Aortic valve: Trileaflet; moderately thickened, moderately calcified leaflets. Valve mobility was restricted. There was moderate stenosis. There was mild regurgitation. Peak velocity (S): 339 cm/s. Mean gradient (S): 28 mm Hg. - Mitral valve: Calcified annulus. - Left atrium: The atrium was moderately dilated. - Right ventricle: The cavity size was mildly dilated. Wall thickness was normal. - Right atrium: The atrium was mildly dilated.  12/2017 cath  Conclusions: 1. Severe 2-vessel coronary artery disease, including sequential 80% and 40% proximal and mid LAD stenoses, as well as 50% ostial and 100% distal RCA lesions. 2. Moderate disease involving LCx and dominant OM Cassidey Barrales. 3. Collateralization of distal RCA branches and appearance of thrombus in the distal RCA suggests subacute thrombosis. 4. Mildly elevated left ventricular filling pressure. 5. Moderately elevated LVOT gradient, likely due to aortic stenosis. 6. Basal and mid inferior hypokinesis with otherwise preserved left ventricular contraction. 7. Successful PCI to the proximal LAD using Resolute Onyx 3.0 x 18 mm drug-eluting stent with 0% residual stenosis and TIMI-3 flow.  Post cathRecommendations: 1. Medical therapy for moderate LCx disease and occluded distal RCA with left-to-right collaterals. 2. Dual antiplatelet therapy with aspirin and ticagrelor for at least 12 months. 3. Aggressive secondary prevention. 4. Obtain echocardiogram for further evaluation of suspect aortic stenosis.  12/2017 carotid US Bilateral 1-39% ICA disease  Labs/Other Tests and Data Reviewed:    EKG:  No ECG reviewed.  Recent Labs: No results found for  requested labs within last 8760 hours.   Recent Lipid Panel Lab Results  Component Value Date/Time   CHOL 133 12/27/2017 02:12 AM   TRIG 55 12/27/2017 02:12 AM   HDL 45 12/27/2017 02:12 AM   CHOLHDL 3.0 12/27/2017 02:12 AM   LDLCALC 77 12/27/2017 02:12 AM    Wt Readings from Last 3 Encounters:  05/18/19 205 lb (93 kg)  10/23/18 214 lb 6.4 oz (97.3 kg)  05/01/18 215 lb (97.5 kg)     Objective:    Vital Signs:  BP 126/61   Pulse 69   Ht 5\' 11"  (1.803 m)   Wt 205 lb (93 kg)   BMI 28.59 kg/m    Normal affect. Normal speech pattern and tone. Comfortable, no apparent distress. No audible signs of SOB or wheezing.   ASSESSMENT & PLAN:    1. CAD - no symptoms, continue current meds. Medical therapy limited due to bradycardia  and prior renal insufficiency. Last Cr imporved, if establishes clear trend could consider low dose ACE-I   2. Aortic stenosis - moderate to severe AS that is asymptomatic - continue to monitor at this time.    3. Hyperlipidemia - request pcp labs, continue statin  4. Carotid stenosis - continue ASA and statin, mild and asymptomatic.   COVID-19 Education: The signs and symptoms of COVID-19 were discussed with the patient and how to seek care for testing (follow up with PCP or arrange E-visit).  The importance of social distancing was discussed today.  Time:   Today, I have spent 22 minutes with the patient with telehealth technology discussing the above problems.     Medication Adjustments/Labs and Tests Ordered: Current medicines are reviewed at length with the patient today.  Concerns regarding medicines are outlined above.   Tests Ordered: No orders of the defined types were placed in this encounter.   Medication Changes: No orders of the defined types were placed in this encounter.   Follow Up:  In Person in 6 month(s)  Signed, Carlyle Dolly, MD  05/18/2019 9:47 AM    [facility]

## 2019-05-20 DIAGNOSIS — E663 Overweight: Secondary | ICD-10-CM | POA: Diagnosis not present

## 2019-05-20 DIAGNOSIS — J329 Chronic sinusitis, unspecified: Secondary | ICD-10-CM | POA: Diagnosis not present

## 2019-05-20 DIAGNOSIS — Z6829 Body mass index (BMI) 29.0-29.9, adult: Secondary | ICD-10-CM | POA: Diagnosis not present

## 2019-05-22 ENCOUNTER — Other Ambulatory Visit: Payer: Self-pay | Admitting: Internal Medicine

## 2019-05-22 ENCOUNTER — Other Ambulatory Visit (HOSPITAL_COMMUNITY): Payer: Self-pay | Admitting: Internal Medicine

## 2019-05-22 DIAGNOSIS — J329 Chronic sinusitis, unspecified: Secondary | ICD-10-CM

## 2019-05-29 ENCOUNTER — Other Ambulatory Visit: Payer: Self-pay

## 2019-05-29 ENCOUNTER — Ambulatory Visit (HOSPITAL_COMMUNITY)
Admission: RE | Admit: 2019-05-29 | Discharge: 2019-05-29 | Disposition: A | Discharge status: Home / Self Care | Payer: Medicare Other | Source: Ambulatory Visit | Attending: Internal Medicine | Admitting: Internal Medicine

## 2019-05-29 DIAGNOSIS — J342 Deviated nasal septum: Secondary | ICD-10-CM | POA: Diagnosis not present

## 2019-05-29 DIAGNOSIS — J329 Chronic sinusitis, unspecified: Secondary | ICD-10-CM | POA: Insufficient documentation

## 2019-05-29 IMAGING — CT CT MAXILLOFACIAL WITHOUT CONTRAST
3 series · 16 of 47 positions shown, 19 images · non-contrast
Comparison: None.

CLINICAL DATA: Right frontal and maxillary sinusitis for 1 month

EXAM:
CT MAXILLOFACIAL WITHOUT CONTRAST
TECHNIQUE: Multidetector CT imaging of the maxillofacial structures was
performed. Multiplanar CT image reconstructions were also generated.

[Series 2: max soft · axial · 0.36mm/px · z∈[+60,+210]mm · 10 of 89 slices shown, 13 images]
[im 7/89  brain]
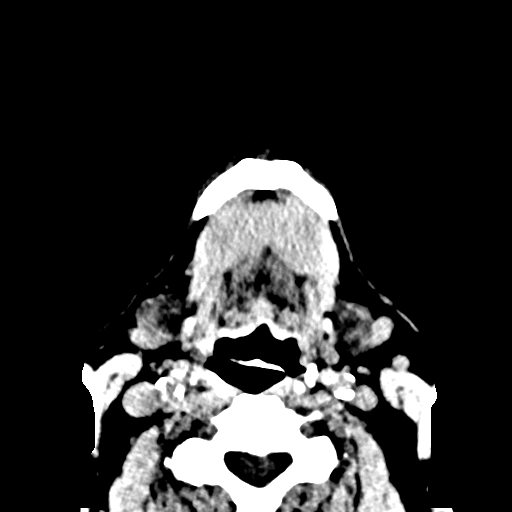
[im 7/89  bone]
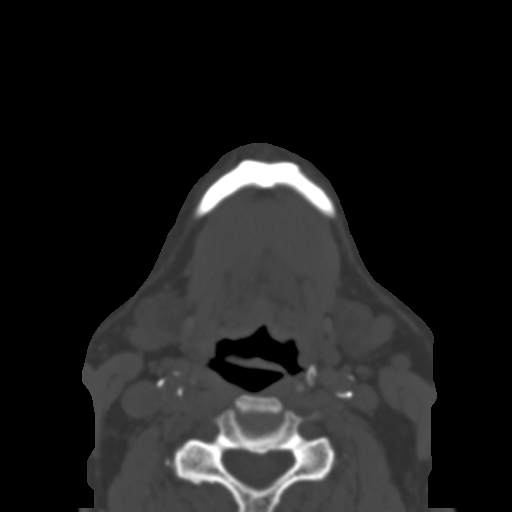
[im 16/89  bone]
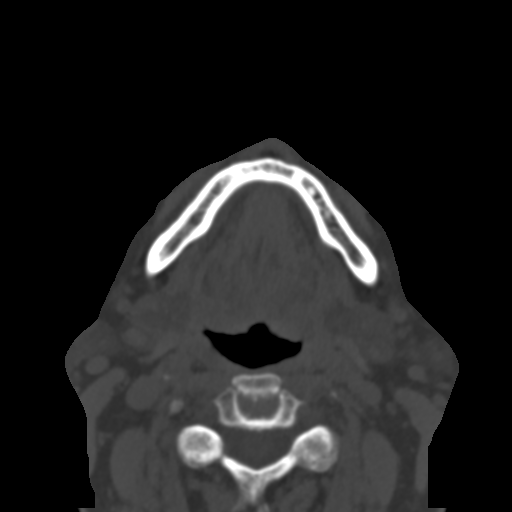
[im 25/89  bone]
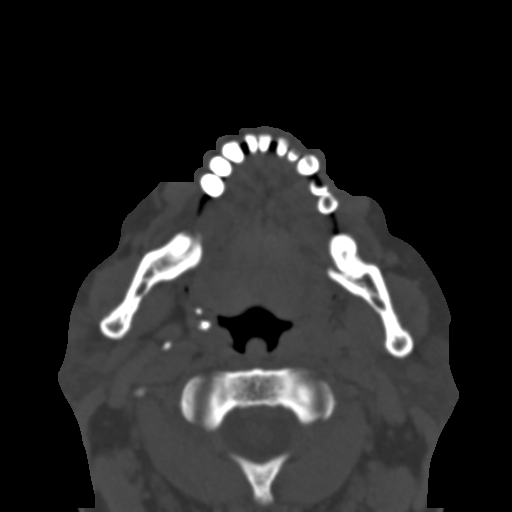
[im 31/89  bone]
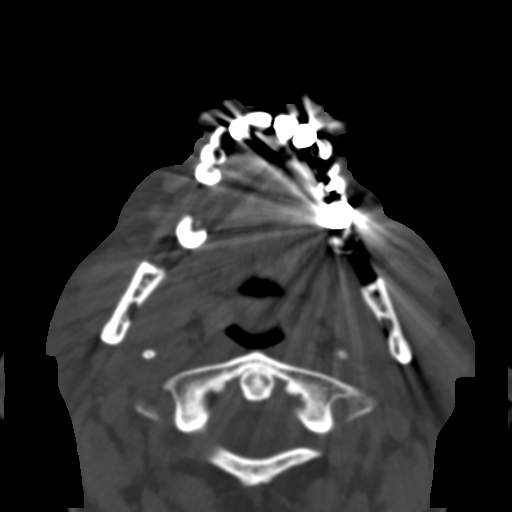
[im 40/89  brain]
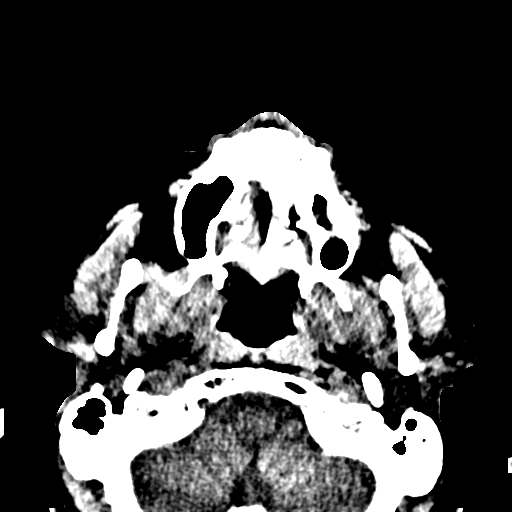
[im 40/89  bone]
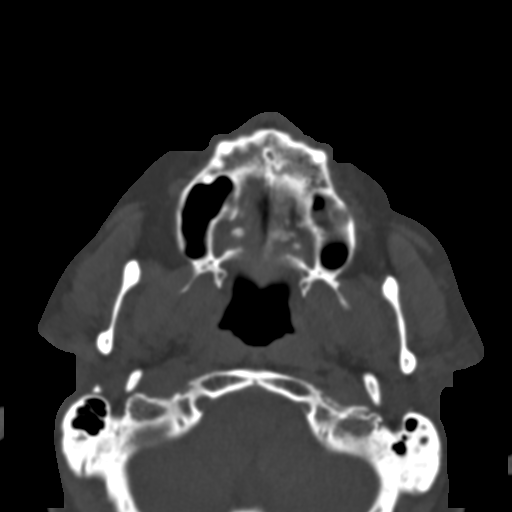
[im 49/89  bone]
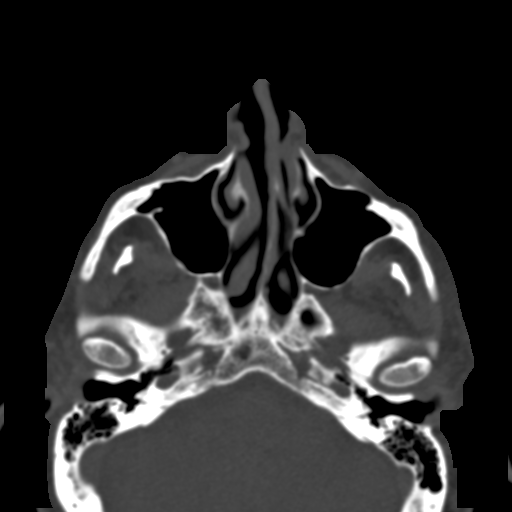
[im 58/89  bone]
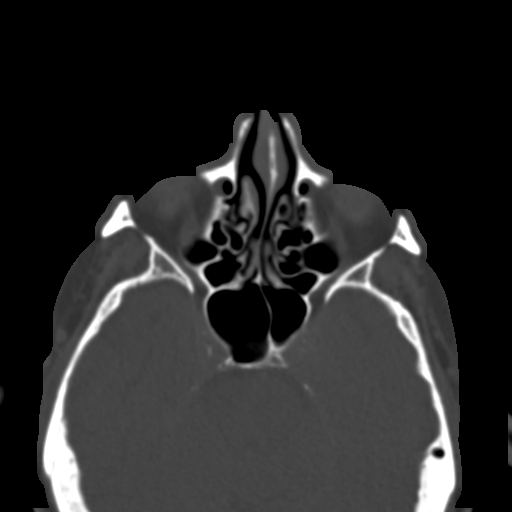
[im 67/89  bone]
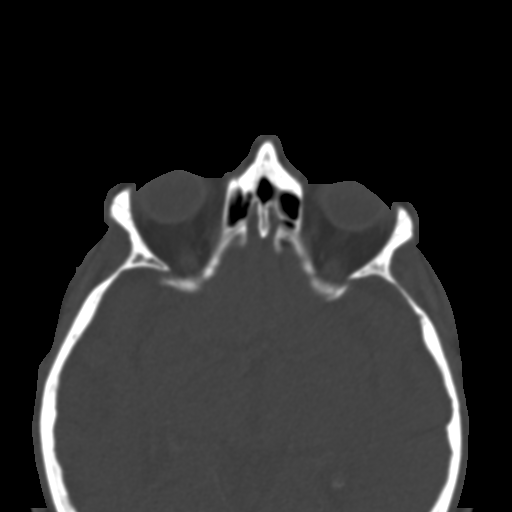
[im 73/89  brain]
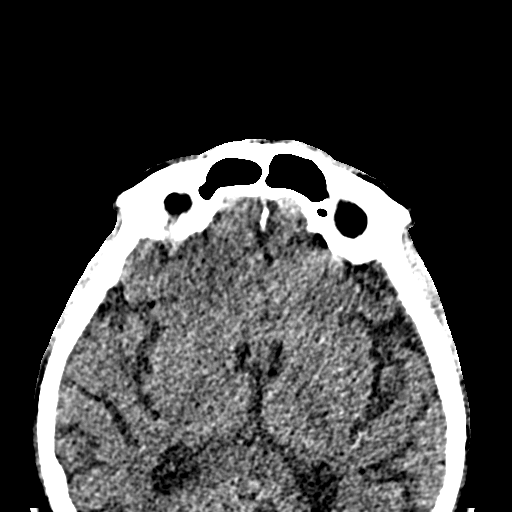
[im 73/89  bone]
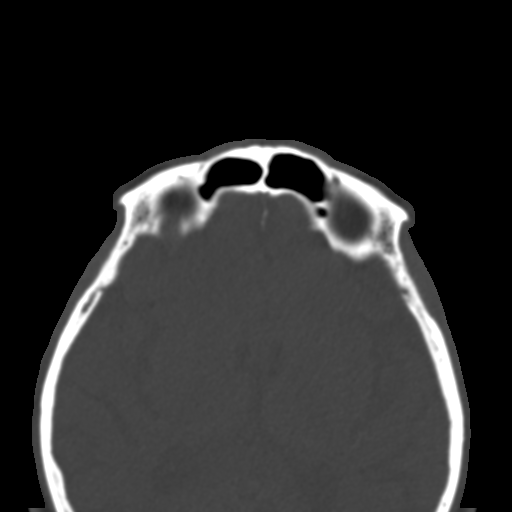
[im 82/89  bone]
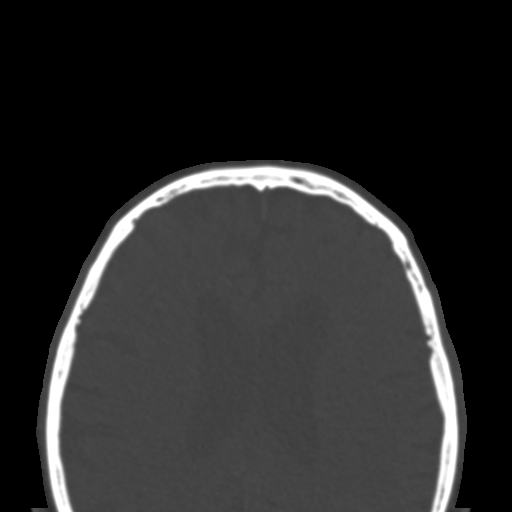

[Series 6: coronal soft · coronal · 0.38mm/px · 3 of 85 slices shown]
[im 29/85  bone]
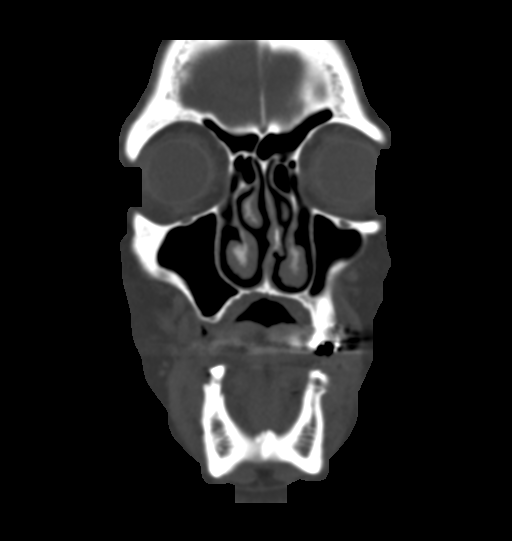
[im 38/85  bone]
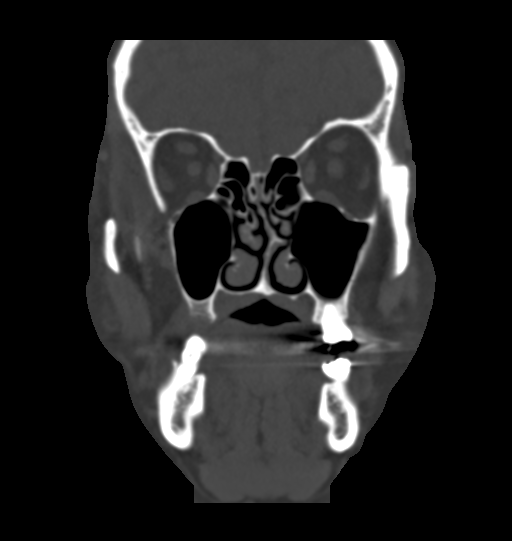
[im 47/85  bone]
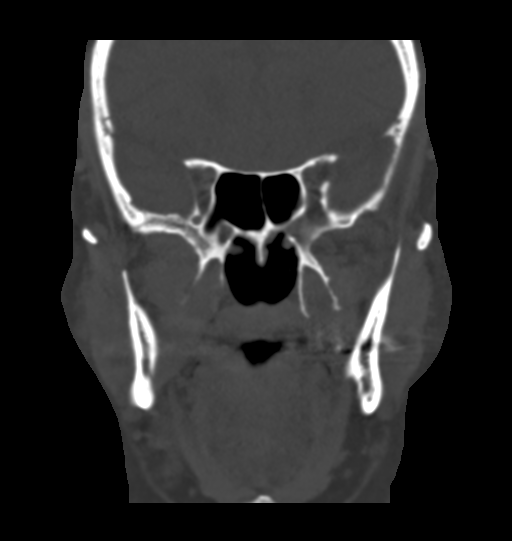

[Series 7: sagittal soft · sagittal · 0.33mm/px · 3 of 96 slices shown]
[im 32/96  bone]
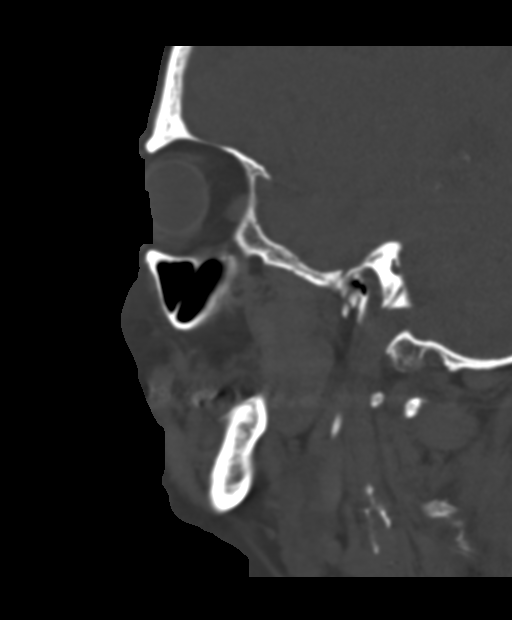
[im 48/96  bone]
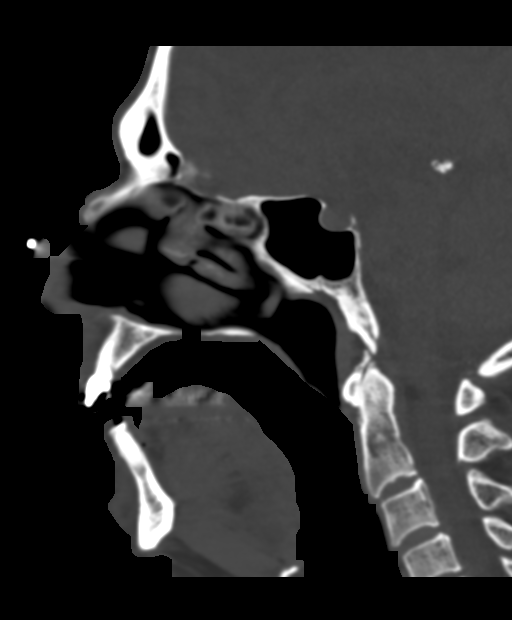
[im 64/96  bone]
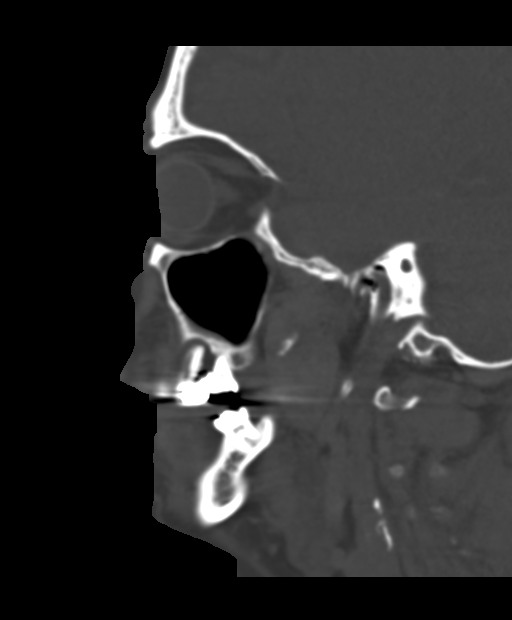

[16 of 47 positions shown; findings below may reference images not displayed]

FINDINGS: Osseous: No fracture or mandibular dislocation. No destructive
process.

Orbits: Negative. No traumatic or inflammatory finding.

Sinuses: Frontal, ethmoid, sphenoid and maxillary sinuses are well
aerated and clear. No significant sinus opacification, air-fluid
level or mucosal thickening. Mastoids are also clear. Ostiomeatal
complex anatomy is patent bilaterally. Turbinates are symmetric.
Septum is deviated to the left.

Soft tissues: No significant finding by noncontrast imaging. Carotid
atherosclerosis noted. No bulky adenopathy.

Limited intracranial: No significant unexpected finding. Age related
brain atrophy.
IMPRESSION: No significant acute or chronic sinus disease by CT.

No other acute finding.

## 2019-07-13 ENCOUNTER — Other Ambulatory Visit: Payer: Self-pay

## 2019-07-13 ENCOUNTER — Ambulatory Visit (INDEPENDENT_AMBULATORY_CARE_PROVIDER_SITE_OTHER): Payer: Medicare Other | Admitting: Otolaryngology

## 2019-07-13 DIAGNOSIS — J310 Chronic rhinitis: Secondary | ICD-10-CM | POA: Diagnosis not present

## 2019-07-13 DIAGNOSIS — J342 Deviated nasal septum: Secondary | ICD-10-CM

## 2019-07-13 DIAGNOSIS — J343 Hypertrophy of nasal turbinates: Secondary | ICD-10-CM | POA: Diagnosis not present

## 2019-08-04 DIAGNOSIS — Z23 Encounter for immunization: Secondary | ICD-10-CM | POA: Diagnosis not present

## 2019-08-05 ENCOUNTER — Other Ambulatory Visit (HOSPITAL_COMMUNITY): Payer: Self-pay | Admitting: Neurology

## 2019-08-05 ENCOUNTER — Other Ambulatory Visit: Payer: Self-pay | Admitting: Neurology

## 2019-08-05 DIAGNOSIS — L959 Vasculitis limited to the skin, unspecified: Secondary | ICD-10-CM | POA: Diagnosis not present

## 2019-08-05 DIAGNOSIS — G4489 Other headache syndrome: Secondary | ICD-10-CM | POA: Diagnosis not present

## 2019-08-05 DIAGNOSIS — G528 Disorders of other specified cranial nerves: Secondary | ICD-10-CM

## 2019-08-05 DIAGNOSIS — R448 Other symptoms and signs involving general sensations and perceptions: Secondary | ICD-10-CM | POA: Diagnosis not present

## 2019-08-05 DIAGNOSIS — G527 Disorders of multiple cranial nerves: Secondary | ICD-10-CM | POA: Diagnosis not present

## 2019-08-05 DIAGNOSIS — Z79899 Other long term (current) drug therapy: Secondary | ICD-10-CM | POA: Diagnosis not present

## 2019-08-11 ENCOUNTER — Other Ambulatory Visit (HOSPITAL_COMMUNITY): Payer: Self-pay | Admitting: Neurology

## 2019-08-11 ENCOUNTER — Other Ambulatory Visit: Payer: Self-pay | Admitting: Neurology

## 2019-08-11 DIAGNOSIS — G528 Disorders of other specified cranial nerves: Secondary | ICD-10-CM

## 2019-08-17 ENCOUNTER — Other Ambulatory Visit (HOSPITAL_COMMUNITY): Payer: Self-pay | Admitting: Neurology

## 2019-08-17 ENCOUNTER — Other Ambulatory Visit: Payer: Self-pay | Admitting: Neurology

## 2019-08-17 DIAGNOSIS — G527 Disorders of multiple cranial nerves: Secondary | ICD-10-CM

## 2019-08-17 DIAGNOSIS — G528 Disorders of other specified cranial nerves: Secondary | ICD-10-CM

## 2019-08-17 DIAGNOSIS — G629 Polyneuropathy, unspecified: Secondary | ICD-10-CM

## 2019-08-17 DIAGNOSIS — R519 Headache, unspecified: Secondary | ICD-10-CM

## 2019-08-18 ENCOUNTER — Other Ambulatory Visit: Payer: Self-pay | Admitting: Neurology

## 2019-08-18 ENCOUNTER — Other Ambulatory Visit (HOSPITAL_COMMUNITY): Payer: Self-pay | Admitting: Neurology

## 2019-08-18 DIAGNOSIS — G629 Polyneuropathy, unspecified: Secondary | ICD-10-CM

## 2019-08-18 DIAGNOSIS — R519 Headache, unspecified: Secondary | ICD-10-CM

## 2019-08-20 ENCOUNTER — Other Ambulatory Visit (HOSPITAL_COMMUNITY): Payer: Self-pay | Admitting: Neurology

## 2019-08-20 ENCOUNTER — Other Ambulatory Visit: Payer: Self-pay | Admitting: *Deleted

## 2019-08-20 ENCOUNTER — Other Ambulatory Visit: Payer: Self-pay | Admitting: Neurology

## 2019-08-20 DIAGNOSIS — G629 Polyneuropathy, unspecified: Secondary | ICD-10-CM

## 2019-08-20 DIAGNOSIS — R519 Headache, unspecified: Secondary | ICD-10-CM

## 2019-08-20 DIAGNOSIS — G519 Disorder of facial nerve, unspecified: Secondary | ICD-10-CM

## 2019-08-24 ENCOUNTER — Other Ambulatory Visit: Payer: Self-pay | Admitting: Neurology

## 2019-08-24 ENCOUNTER — Other Ambulatory Visit (HOSPITAL_COMMUNITY): Payer: Self-pay | Admitting: Neurology

## 2019-08-24 DIAGNOSIS — G529 Cranial nerve disorder, unspecified: Secondary | ICD-10-CM

## 2019-08-24 DIAGNOSIS — Z1389 Encounter for screening for other disorder: Secondary | ICD-10-CM

## 2019-08-24 DIAGNOSIS — G441 Vascular headache, not elsewhere classified: Secondary | ICD-10-CM

## 2019-08-24 DIAGNOSIS — H5711 Ocular pain, right eye: Secondary | ICD-10-CM

## 2019-08-31 ENCOUNTER — Ambulatory Visit (HOSPITAL_COMMUNITY)
Admission: RE | Admit: 2019-08-31 | Discharge: 2019-08-31 | Disposition: A | Discharge status: Home / Self Care | Payer: Medicare Other | Source: Ambulatory Visit | Attending: Neurology | Admitting: Neurology

## 2019-08-31 ENCOUNTER — Other Ambulatory Visit: Payer: Self-pay

## 2019-08-31 DIAGNOSIS — G441 Vascular headache, not elsewhere classified: Secondary | ICD-10-CM | POA: Insufficient documentation

## 2019-08-31 DIAGNOSIS — H5711 Ocular pain, right eye: Secondary | ICD-10-CM | POA: Diagnosis not present

## 2019-08-31 DIAGNOSIS — Z1389 Encounter for screening for other disorder: Secondary | ICD-10-CM | POA: Insufficient documentation

## 2019-08-31 DIAGNOSIS — G529 Cranial nerve disorder, unspecified: Secondary | ICD-10-CM | POA: Insufficient documentation

## 2019-08-31 DIAGNOSIS — Z77018 Contact with and (suspected) exposure to other hazardous metals: Secondary | ICD-10-CM | POA: Diagnosis not present

## 2019-08-31 DIAGNOSIS — J0100 Acute maxillary sinusitis, unspecified: Secondary | ICD-10-CM | POA: Diagnosis not present

## 2019-08-31 DIAGNOSIS — J342 Deviated nasal septum: Secondary | ICD-10-CM | POA: Diagnosis not present

## 2019-08-31 LAB — POCT I-STAT CREATININE: Creatinine, Ser: 1.2 mg/dL (ref 0.61–1.24)

## 2019-08-31 IMAGING — MR MR FACE/TRIGEMINAL WO/W CM
5 of 8 series · 20 of 48 positions shown · IV contrast (gadavist)
Comparison: None.

CLINICAL DATA: Right frontal and maxillary sinusitis

EXAM:
MRI FACE TRIGEMINAL WITHOUT AND WITH CONTRAST
TECHNIQUE: Multiplanar, multiecho pulse sequences of the face and surrounding
structures, including thin slice imaging of the course of the
Trigeminal Nerves, were obtained both before and after
administration of intravenous contrast. Stent
CONTRAST:  7mL GADAVIST GADOBUTROL 1 MMOL/ML IV SOLN

[Series 3: T1 · sagittal · 3.0mm · 0.35mm/px · 4 of 43 slices shown (1 of 3)]
[im 1/43]
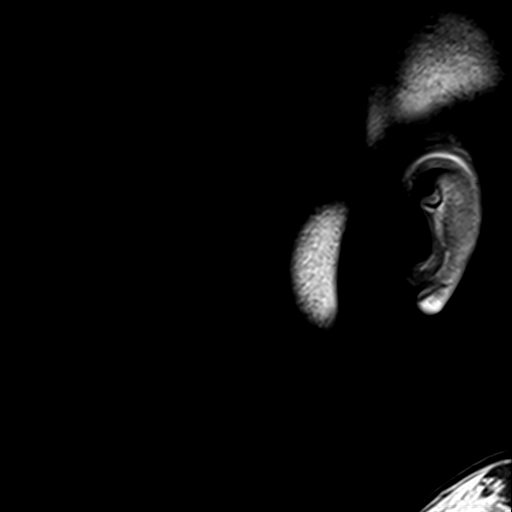
[im 15/43]
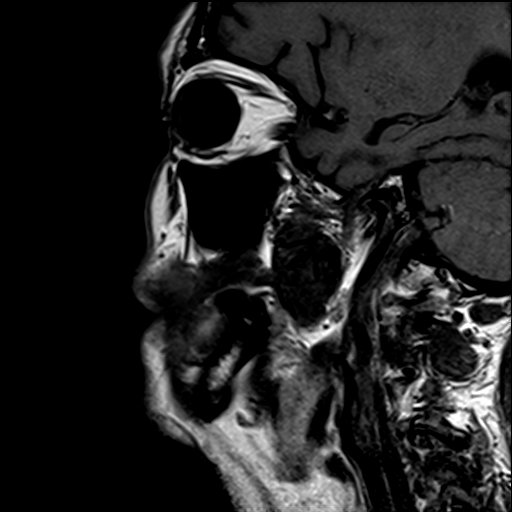
[im 29/43]
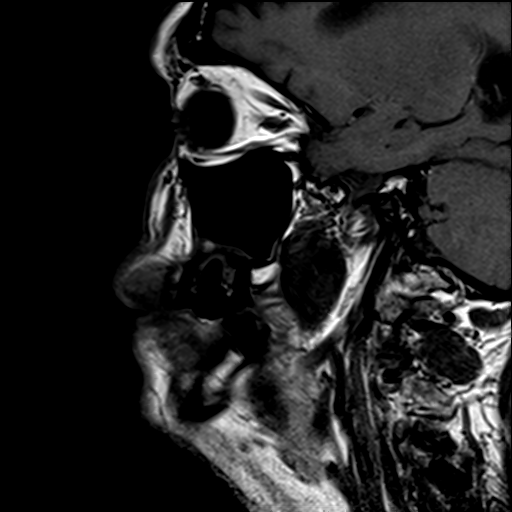
[im 43/43]
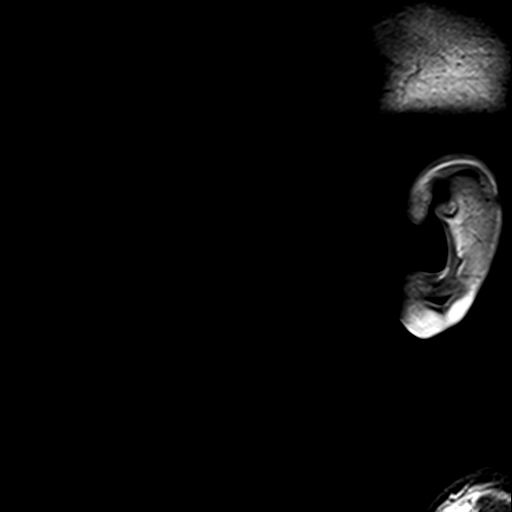

[Series 4: T2 · coronal · 3.0mm · 0.40mm/px · 4 of 34 slices shown]
[im 1/34]
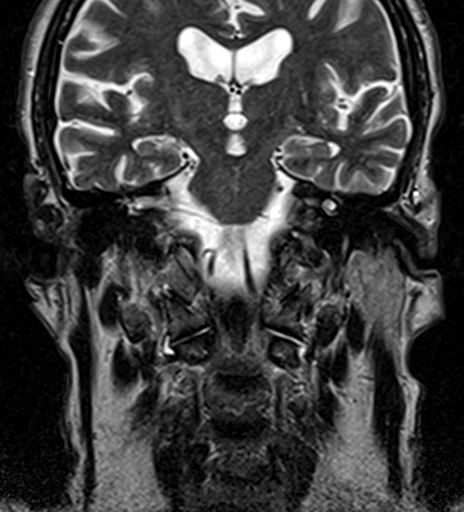
[im 12/34]
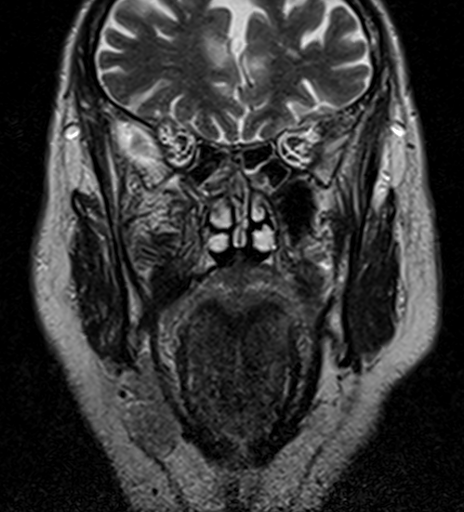
[im 23/34]
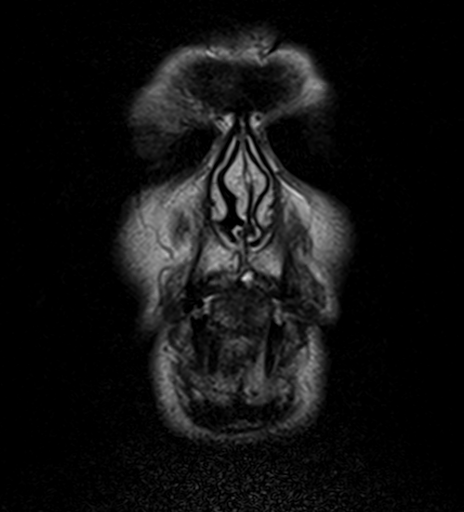
[im 34/34]
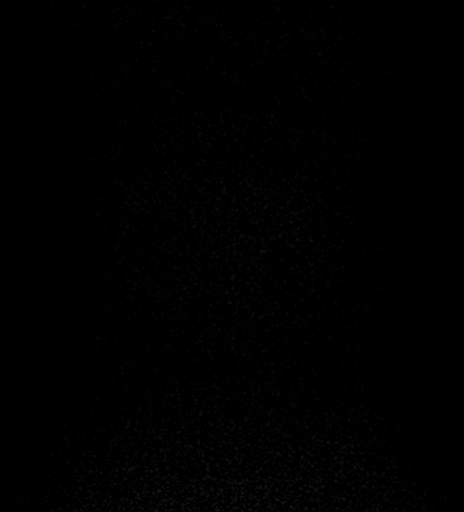

[Series 7: T1 · axial · 3.0mm · 0.37mm/px · z∈[-81,+41]mm · 5 of 38 slices shown (2 of 3)]
[im 1/38]
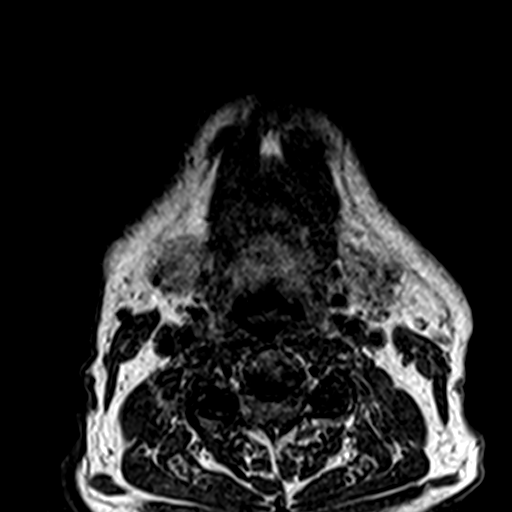
[im 10/38]
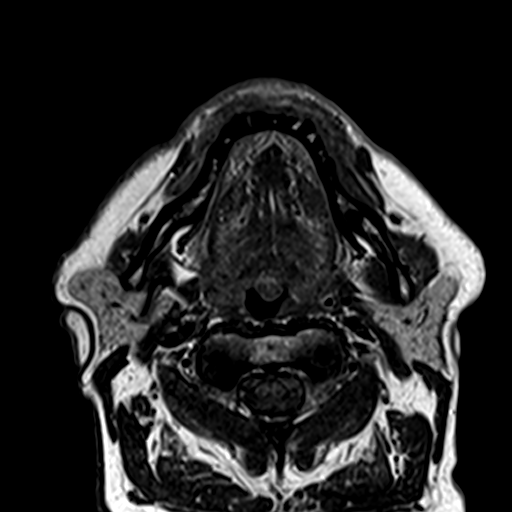
[im 19/38]
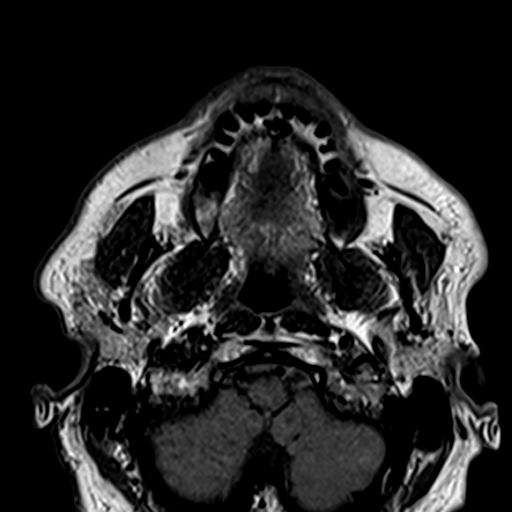
[im 28/38]
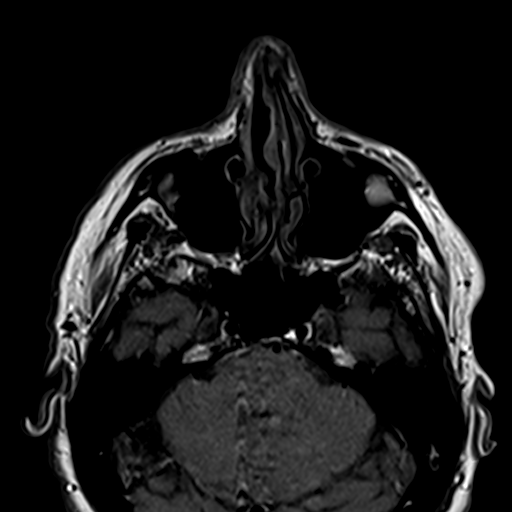
[im 38/38]
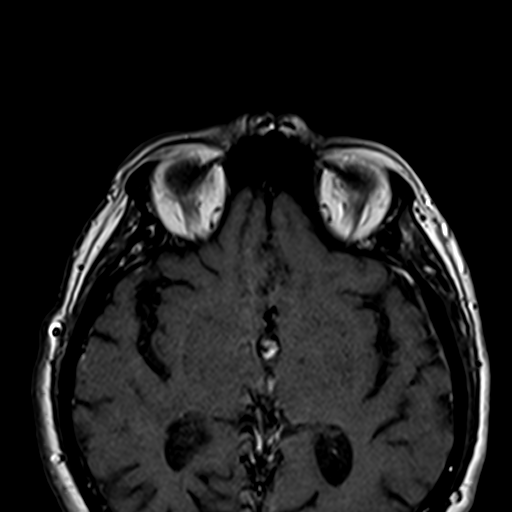

[Series 8: T1 · coronal · 3.0mm · 0.38mm/px · 5 of 40 slices shown (3 of 3)]
[im 1/40]
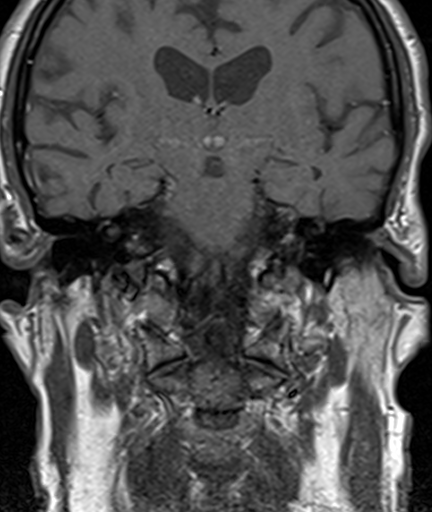
[im 10/40]
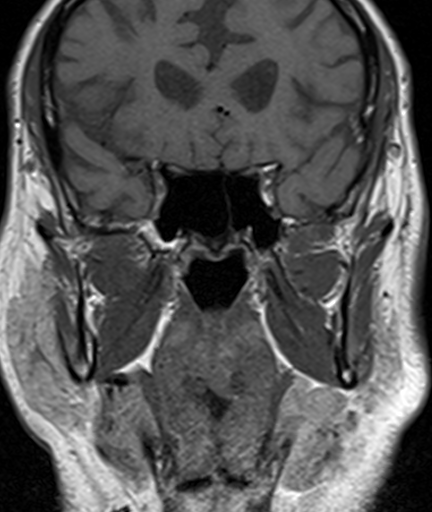
[im 20/40]
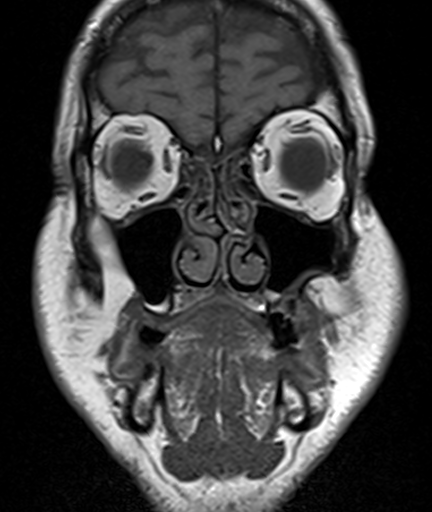
[im 30/40]
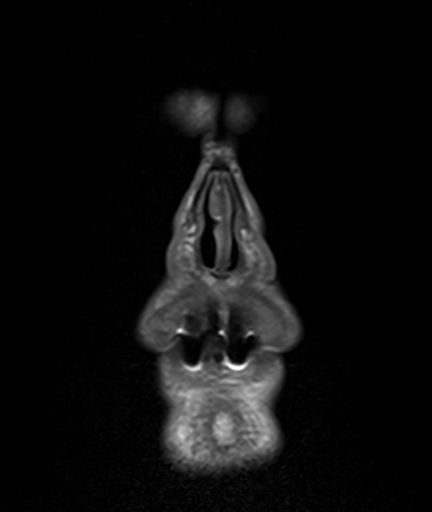
[im 40/40]
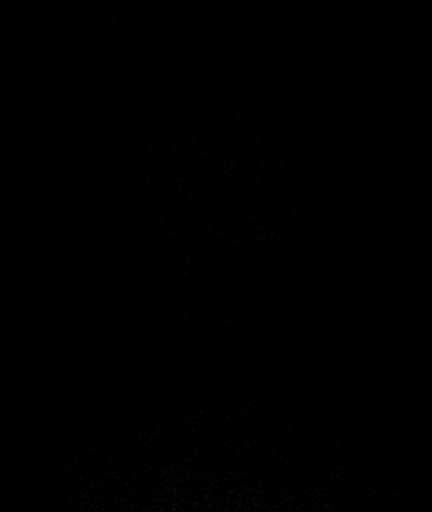

[Series 9: T1 fat-sat post-contrast · coronal · 3.0mm · 0.36mm/px · 2 of 40 slices shown]
[im 1/40]
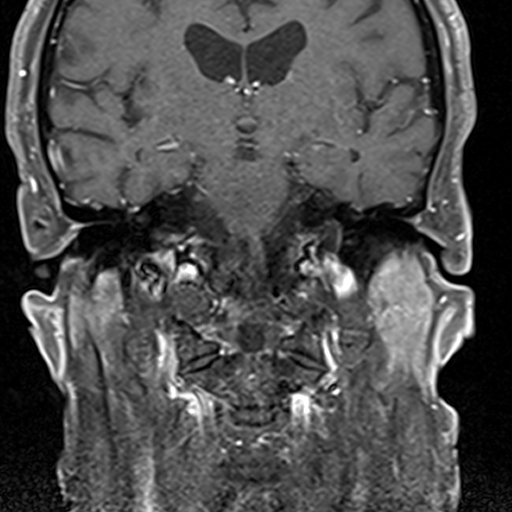
[im 10/40]
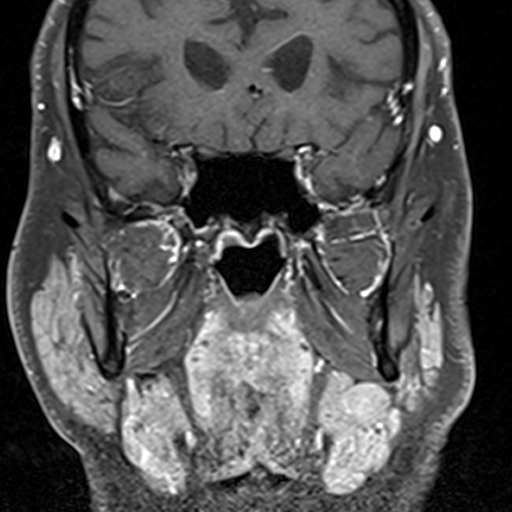

[20 of 48 positions shown; findings below may reference images not displayed]

FINDINGS: There is no prepontine or cerebellopontine angle mass. Cisternal
trigeminal nerves are unremarkable. Meckel's caves are symmetric and
unremarkable. Parotid and visualized submandibular glands are
unremarkable. Orbits are unremarkable.

A 5 mm nonenhancing, T2 hyperintense lesion of the anterior
pituitary likely reflects a small cyst or less likely adenoma.

Leftward nasal septal deviation with a spur. Minor paranasal sinus
mucosal thickening. Middle meatus and superior meatus appear patent.
IMPRESSION: No mass or abnormal enhancement along the trigeminal nerves. Minor
paranasal sinus inflammatory changes.

Subcentimeter incidental pituitary cyst or less likely adenoma.

## 2019-08-31 IMAGING — DX DG ORBITS FOR FOREIGN BODY
2 series · 2 of 2 positions shown · non-contrast
Comparison: None.

CLINICAL DATA: Metal working/exposure; clearance prior to MRI

EXAM:
ORBITS FOR FOREIGN BODY - 2 VIEW

[orbits lat (1 of 2)]
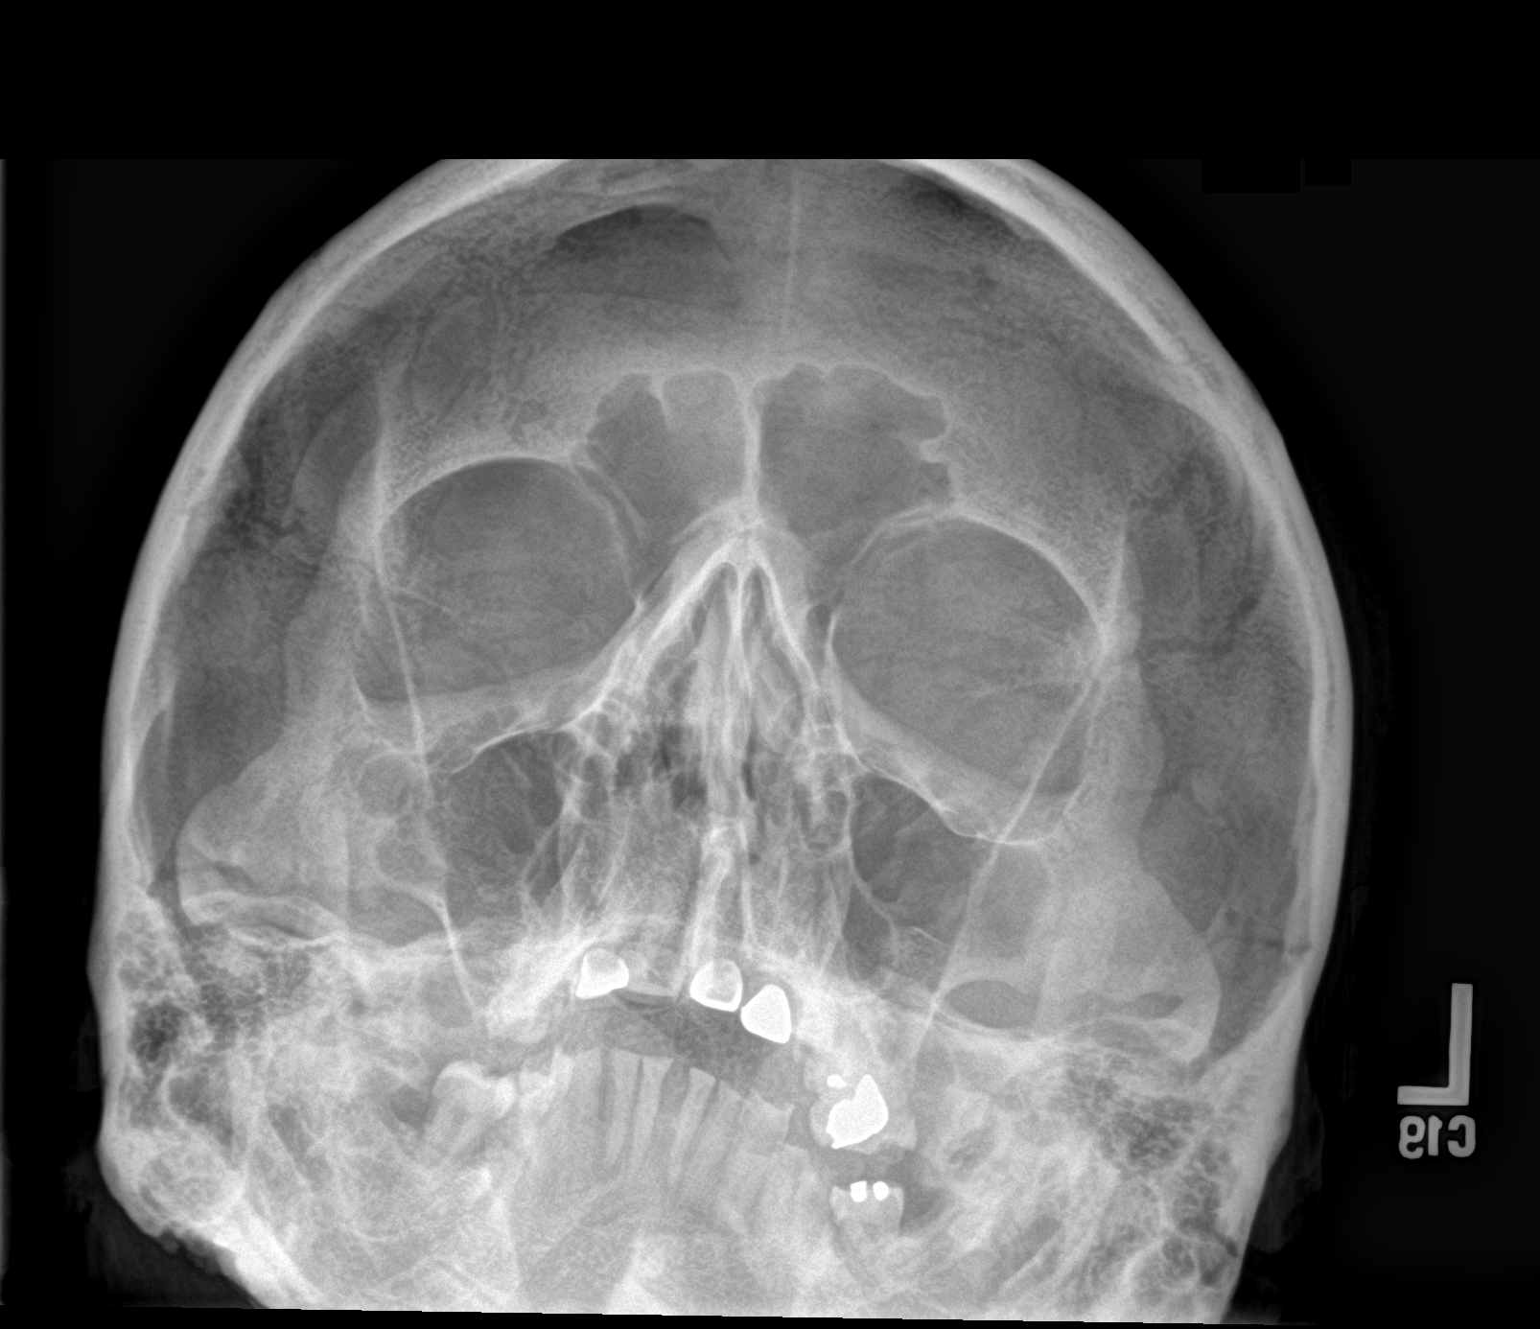

[orbits lat (2 of 2)]
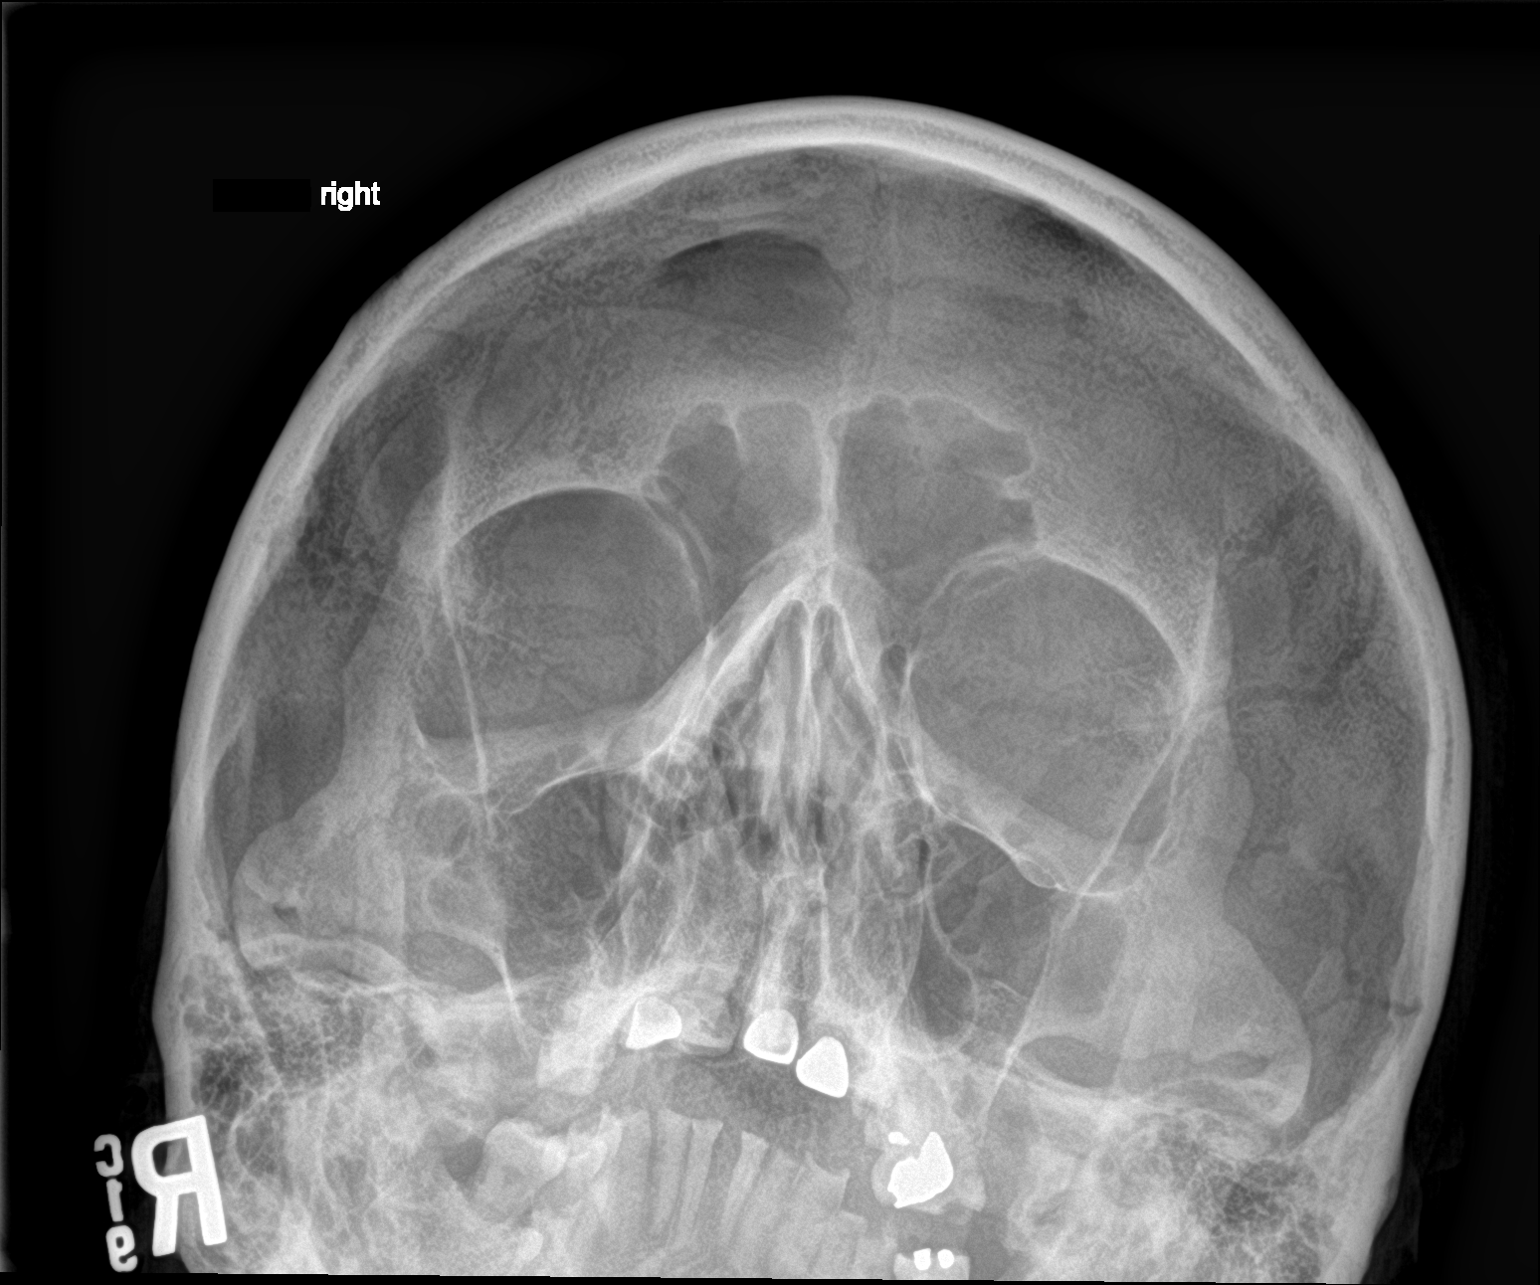

[2 of 2 positions shown; findings below may reference images not displayed]

FINDINGS: Water's views with eyes deviated toward the right and toward the
left obtained. No intraorbital radiopaque foreign body. Paranasal
sinuses and mastoids are clear. No fracture or dislocation.
IMPRESSION: No evidence of metallic foreign body within the orbits.

## 2019-08-31 MED ORDER — GADOBUTROL 1 MMOL/ML IV SOLN
7.0000 mL | Freq: Once | INTRAVENOUS | Status: AC | PRN
  Administered 2019-08-31: 7 mL via INTRAVENOUS

## 2019-09-01 DIAGNOSIS — R448 Other symptoms and signs involving general sensations and perceptions: Secondary | ICD-10-CM | POA: Diagnosis not present

## 2019-09-01 DIAGNOSIS — G4489 Other headache syndrome: Secondary | ICD-10-CM | POA: Diagnosis not present

## 2019-09-01 DIAGNOSIS — G527 Disorders of multiple cranial nerves: Secondary | ICD-10-CM | POA: Diagnosis not present

## 2019-10-13 DIAGNOSIS — G4489 Other headache syndrome: Secondary | ICD-10-CM | POA: Diagnosis not present

## 2019-10-13 DIAGNOSIS — R448 Other symptoms and signs involving general sensations and perceptions: Secondary | ICD-10-CM | POA: Diagnosis not present

## 2019-10-13 DIAGNOSIS — G527 Disorders of multiple cranial nerves: Secondary | ICD-10-CM | POA: Diagnosis not present

## 2019-11-04 ENCOUNTER — Ambulatory Visit: Payer: Medicare Other | Admitting: Urology

## 2019-11-25 DIAGNOSIS — E6609 Other obesity due to excess calories: Secondary | ICD-10-CM | POA: Diagnosis not present

## 2019-11-25 DIAGNOSIS — I2510 Atherosclerotic heart disease of native coronary artery without angina pectoris: Secondary | ICD-10-CM | POA: Diagnosis not present

## 2019-11-25 DIAGNOSIS — Z1389 Encounter for screening for other disorder: Secondary | ICD-10-CM | POA: Diagnosis not present

## 2019-11-25 DIAGNOSIS — Z0001 Encounter for general adult medical examination with abnormal findings: Secondary | ICD-10-CM | POA: Diagnosis not present

## 2019-11-25 DIAGNOSIS — J329 Chronic sinusitis, unspecified: Secondary | ICD-10-CM | POA: Diagnosis not present

## 2019-11-25 DIAGNOSIS — Z6830 Body mass index (BMI) 30.0-30.9, adult: Secondary | ICD-10-CM | POA: Diagnosis not present

## 2019-11-25 DIAGNOSIS — I350 Nonrheumatic aortic (valve) stenosis: Secondary | ICD-10-CM | POA: Diagnosis not present

## 2019-11-25 DIAGNOSIS — E7849 Other hyperlipidemia: Secondary | ICD-10-CM | POA: Diagnosis not present

## 2019-12-08 DIAGNOSIS — J310 Chronic rhinitis: Secondary | ICD-10-CM | POA: Diagnosis not present

## 2019-12-08 DIAGNOSIS — G501 Atypical facial pain: Secondary | ICD-10-CM | POA: Diagnosis not present

## 2019-12-08 DIAGNOSIS — J342 Deviated nasal septum: Secondary | ICD-10-CM | POA: Diagnosis not present

## 2019-12-08 DIAGNOSIS — J343 Hypertrophy of nasal turbinates: Secondary | ICD-10-CM | POA: Diagnosis not present

## 2019-12-17 DIAGNOSIS — L570 Actinic keratosis: Secondary | ICD-10-CM | POA: Diagnosis not present

## 2019-12-17 DIAGNOSIS — D0439 Carcinoma in situ of skin of other parts of face: Secondary | ICD-10-CM | POA: Diagnosis not present

## 2019-12-17 DIAGNOSIS — X32XXXD Exposure to sunlight, subsequent encounter: Secondary | ICD-10-CM | POA: Diagnosis not present

## 2019-12-23 ENCOUNTER — Other Ambulatory Visit: Payer: Self-pay

## 2019-12-23 ENCOUNTER — Ambulatory Visit (INDEPENDENT_AMBULATORY_CARE_PROVIDER_SITE_OTHER): Payer: Medicare Other | Admitting: Urology

## 2019-12-23 ENCOUNTER — Encounter: Payer: Self-pay | Admitting: Urology

## 2019-12-23 VITALS — BP 119/61 | HR 80 | Temp 97.5°F | Ht 71.0 in | Wt 212.0 lb

## 2019-12-23 DIAGNOSIS — R351 Nocturia: Secondary | ICD-10-CM | POA: Diagnosis not present

## 2019-12-23 DIAGNOSIS — R3912 Poor urinary stream: Secondary | ICD-10-CM | POA: Diagnosis not present

## 2019-12-23 MED ORDER — ALFUZOSIN HCL ER 10 MG PO TB24: 10.0000 mg | ORAL_TABLET | Freq: Every day | ORAL | 3 refills | Status: DC

## 2019-12-23 MED ORDER — ATORVASTATIN CALCIUM 80 MG PO TABS: 80.0000 mg | ORAL_TABLET | Freq: Every day | ORAL | 3 refills | Status: DC

## 2019-12-23 NOTE — Progress Notes (Signed)

## 2019-12-23 NOTE — Patient Instructions (Signed)

## 2019-12-23 NOTE — Progress Notes (Signed)
12/23/2019 9:55 AM   Jerry Roach 01/21/1946 [id number]  Referring provider: Redmond School, MD [address] [place],  [place] [address]  Nocturia  HPI: Jerry Roach is a 74yo here for followup for BPH with Weak stream and nocturia. He is currently on uroxatral 10mg  daily. His stream is strong on the medication. Nocturia is 1-2x based on caffeine intake and fluid management prior to bedtime. No other LUTS. No hematuria or dysuria. No other complaints Since last visit he has stopped smoking cigarettes   PMH: Past Medical History:  Diagnosis Date  . CAD (coronary artery disease)    a. 12/2017: s/p NSTEMI with DES to Proximal LAD. CTO of RCA with L--> R collaterals noted.   . Hypercholesteremia   . Nocturia   . Weak urinary stream     Surgical History: Past Surgical History:  Procedure Laterality Date  . CATARACT EXTRACTION W/PHACO Left 03/03/2015   Procedure: CATARACT EXTRACTION PHACO AND INTRAOCULAR LENS PLACEMENT (IOC);  Surgeon: Baruch Goldmann, MD;  Location: [facility];  Service: Ophthalmology;  Laterality: Left;  CDE 6.86  . CATARACT EXTRACTION W/PHACO Right 08/31/2015   Procedure: CATARACT EXTRACTION PHACO AND INTRAOCULAR LENS PLACEMENT RIGHT EYE CDE=4.08;  Surgeon: Baruch Goldmann, MD;  Location: [facility];  Service: Ophthalmology;  Laterality: Right;  . COLONOSCOPY  11/05/2011   Procedure: COLONOSCOPY;  Surgeon: Daneil Dolin, MD;  [facility];  Service: Endoscopy;  Laterality: N/A;  7:30 AM  . CORONARY STENT INTERVENTION N/A 12/27/2017   Procedure: CORONARY STENT INTERVENTION;  Surgeon: Nelva Bush, MD;  Location: [place] [facility];  Service: Cardiovascular;  Laterality: N/A;  . LEFT HEART CATH AND CORONARY ANGIOGRAPHY N/A 12/27/2017   Procedure: LEFT HEART CATH AND CORONARY ANGIOGRAPHY;  Surgeon: Nelva Bush, MD;  Location: [place] [facility];  Service: Cardiovascular;  Laterality: N/A;  . NO PAST SURGERIES      Home Medications:  Allergies as of  12/23/2019   No Known Allergies     Medication List       Accurate as of December 23, 2019  9:55 AM. If you have any questions, ask your nurse or doctor.        acetaminophen 325 MG tablet Commonly known as: TYLENOL Take 2 tablets (650 mg total) by mouth every 6 (six) hours as needed for mild pain or headache.   alfuzosin 10 MG 24 hr tablet Commonly known as: UROXATRAL Take 10 mg by mouth daily with breakfast.   aspirin 81 MG EC tablet Take 1 tablet (81 mg total) by mouth daily.   atorvastatin 80 MG tablet Commonly known as: LIPITOR Take 1 tablet (80 mg total) by mouth daily at 6 PM.   gabapentin 300 MG capsule Commonly known as: NEURONTIN TAKE 1 CAPSULE AT BEDTIMETFOR 2 NIGHTS, THEN 1 CAPSULE TWICE DAILY.   mometasone 50 MCG/ACT nasal spray Commonly known as: NASONEX 2 sprays daily.   nitroGLYCERIN 0.4 MG SL tablet Commonly known as: NITROSTAT Place 1 tablet (0.4 mg total) under the tongue every 5 (five) minutes x 3 doses as needed for chest pain.   ZyrTEC-D Allergy & Congestion 5-120 MG tablet Generic drug: cetirizine-pseudoephedrine Take 1 tablet by mouth 2 (two) times daily.       Allergies: No Known Allergies  Family History: Family History  Problem Relation Age of Onset  . Hypertension Father   . Colon cancer Neg Hx     Social History:  reports that he quit smoking about 1 years ago. His  smoking use included cigarettes. He has a 58.50 pack-year smoking history. He has never used smokeless tobacco. He reports that he does not drink alcohol or use drugs.  ROS: All other review of systems were reviewed and are negative except what is noted above in HPI  Physical Exam: BP 119/61   Pulse 80   Temp (!) 97.5 F (36.4 C)   Ht 5\' 11"  (1.803 m)   Wt 212 lb (96.2 kg)   BMI 29.57 kg/m   Constitutional:  Alert and oriented, No acute distress. HEENT: [place] AT, moist mucus membranes.  Trachea midline, no masses. Cardiovascular: No clubbing, cyanosis, or edema.  Respiratory: Normal respiratory effort, no increased work of breathing. GI: Abdomen is soft, nontender, nondistended, no abdominal masses GU: No CVA tenderness Lymph: No cervical or inguinal lymphadenopathy. Skin: No rashes, bruises or suspicious lesions. Neurologic: Grossly intact, no focal deficits, moving all 4 extremities. Psychiatric: Normal mood and affect.  Laboratory Data: Lab Results  Component Value Date   WBC 11.1 (H) 12/28/2017   HGB 14.7 12/28/2017   HCT 43.3 12/28/2017   MCV 95.6 12/28/2017   PLT 136 (L) 12/28/2017    Lab Results  Component Value Date   CREATININE 1.20 08/31/2019    No results found for: PSA  No results found for: TESTOSTERONE  No results found for: HGBA1C  Urinalysis No results found for: COLORURINE, APPEARANCEUR, LABSPEC, PHURINE, GLUCOSEU, HGBUR, BILIRUBINUR, KETONESUR, PROTEINUR, UROBILINOGEN, NITRITE, LEUKOCYTESUR  No results found for: LABMICR, [place], RBCUA, LABEPIT, MUCUS, BACTERIA  Pertinent Imaging:  No results found for this or any previous visit. No results found for this or any previous visit. No results found for this or any previous visit. No results found for this or any previous visit. No results found for this or any previous visit. No results found for this or any previous visit. No results found for this or any previous visit. No results found for this or any previous visit.  Assessment & Plan:    1. BPH with LUTS, Weak urinary stream -refill uroxatral 10mg   2. Nocturia Continue fluid management and decrease caffeine   No follow-ups on file.  Nicolette Bang, MD  [facility] [place]

## 2019-12-23 NOTE — Telephone Encounter (Signed)
refilled Atorvastatin 80 mg #90 RF:3

## 2020-01-18 DIAGNOSIS — L570 Actinic keratosis: Secondary | ICD-10-CM | POA: Diagnosis not present

## 2020-01-18 DIAGNOSIS — Z08 Encounter for follow-up examination after completed treatment for malignant neoplasm: Secondary | ICD-10-CM | POA: Diagnosis not present

## 2020-01-18 DIAGNOSIS — Z85828 Personal history of other malignant neoplasm of skin: Secondary | ICD-10-CM | POA: Diagnosis not present

## 2020-01-18 DIAGNOSIS — D0439 Carcinoma in situ of skin of other parts of face: Secondary | ICD-10-CM | POA: Diagnosis not present

## 2020-01-18 DIAGNOSIS — X32XXXD Exposure to sunlight, subsequent encounter: Secondary | ICD-10-CM | POA: Diagnosis not present

## 2020-01-26 ENCOUNTER — Encounter: Payer: Self-pay | Admitting: Cardiology

## 2020-01-26 ENCOUNTER — Other Ambulatory Visit: Payer: Self-pay

## 2020-01-26 ENCOUNTER — Ambulatory Visit (INDEPENDENT_AMBULATORY_CARE_PROVIDER_SITE_OTHER): Payer: Medicare Other | Admitting: Cardiology

## 2020-01-26 VITALS — BP 106/58 | HR 72 | Temp 98.2°F | Ht 71.0 in | Wt 211.0 lb

## 2020-01-26 DIAGNOSIS — I350 Nonrheumatic aortic (valve) stenosis: Secondary | ICD-10-CM | POA: Diagnosis not present

## 2020-01-26 DIAGNOSIS — I2510 Atherosclerotic heart disease of native coronary artery without angina pectoris: Secondary | ICD-10-CM | POA: Diagnosis not present

## 2020-01-26 DIAGNOSIS — E782 Mixed hyperlipidemia: Secondary | ICD-10-CM

## 2020-01-26 NOTE — Patient Instructions (Signed)
Medication Instructions:  Your physician recommends that you continue on your current medications as directed. Please refer to the Current Medication list given to you today.   Labwork: I WILL REQUEST LABS FROM PCP.   Testing/Procedures: Your physician has requested that you have an echocardiogram. Echocardiography is a painless test that uses sound waves to create images of your heart. It provides your doctor with information about the size and shape of your heart and how well your heart's chambers and valves are working. This procedure takes approximately one hour. There are no restrictions for this procedure.    Follow-Up: Your physician wants you to follow-up in: 6 MONTHS .  You will receive a reminder letter in the mail two months in advance. If you don't receive a letter, please call our office to schedule the follow-up appointment.   Any Other Special Instructions Will Be Listed Below (If Applicable).     If you need a refill on your cardiac medications before your next appointment, please call your pharmacy.   

## 2020-01-26 NOTE — Progress Notes (Signed)
Clinical Summary Jerry Roach is a 74 y.o.male seen today for follow up of the following medical problems.  1. CAD - admit 12/2017 with chest pain, ruled in for NSTEMI - received DES to LAD. Residual RCA CTO and moderate LCX disease managed medically.  - 12/2017 echo LVEF 55-60%, no WMAs, grade II diastolic dysfunction. modeare AS - has not been on beta blocker due to bradycardia.Has had some prior renal insufficencyhas not been on ACE-I.      - no recent chest pain - no SOB/DOE  - compliant with meds   2. Aortic stenosis - 12/2017 moderate AS by echo: mean grad 28, AVA VTI 0.81, dimensionless index 0.24.   03/2019 echo LVEF 55-60%, mod to severe AS (mean grad 23, AVA VTI 0.94, dimensionless index 0.28)  - no recent symptoms.    3. Hyperlipidemia - 12/2017 TC 133 TG 55 HDL 45 LDL 77 - compliant with statin  -= recent labs with pcp  4. Carotid stenosis - 03/2019 mild bilateral disease   Past Medical History:  Diagnosis Date  . CAD (coronary artery disease)    a. 12/2017: s/p NSTEMI with DES to Proximal LAD. CTO of RCA with L--> R collaterals noted.   . Hypercholesteremia   . Nocturia   . Weak urinary stream      No Known Allergies   Current Outpatient Medications  Medication Sig Dispense Refill  . acetaminophen (TYLENOL) 325 MG tablet Take 2 tablets (650 mg total) by mouth every 6 (six) hours as needed for mild pain or headache.    . alfuzosin (UROXATRAL) 10 MG 24 hr tablet Take 1 tablet (10 mg total) by mouth daily with breakfast. 90 tablet 3  . aspirin EC 81 MG EC tablet Take 1 tablet (81 mg total) by mouth daily.    Marland Kitchen atorvastatin (LIPITOR) 80 MG tablet Take 1 tablet (80 mg total) by mouth daily at 6 PM. 90 tablet 3  . gabapentin (NEURONTIN) 300 MG capsule TAKE 1 CAPSULE AT BEDTIMETFOR 2 NIGHTS, THEN 1 CAPSULE TWICE DAILY.    . mometasone (NASONEX) 50 MCG/ACT nasal spray 2 sprays daily.    . nitroGLYCERIN (NITROSTAT) 0.4 MG SL tablet Place 1  tablet (0.4 mg total) under the tongue every 5 (five) minutes x 3 doses as needed for chest pain. 25 tablet 3  . ZYRTEC-D ALLERGY & CONGESTION 5-120 MG tablet Take 1 tablet by mouth 2 (two) times daily.     No current facility-administered medications for this visit.     Past Surgical History:  Procedure Laterality Date  . CATARACT EXTRACTION W/PHACO Left 03/03/2015   Procedure: CATARACT EXTRACTION PHACO AND INTRAOCULAR LENS PLACEMENT (IOC);  Surgeon: Jerry Goldmann, MD;  Location: [facility];  Service: Ophthalmology;  Laterality: Left;  CDE 6.86  . CATARACT EXTRACTION W/PHACO Right 08/31/2015   Procedure: CATARACT EXTRACTION PHACO AND INTRAOCULAR LENS PLACEMENT RIGHT EYE CDE=4.08;  Surgeon: Jerry Goldmann, MD;  Location: [facility];  Service: Ophthalmology;  Laterality: Right;  . COLONOSCOPY  11/05/2011   Procedure: COLONOSCOPY;  Surgeon: Jerry Dolin, MD;  Location: [facility];  Service: Endoscopy;  Laterality: N/A;  7:30 AM  . CORONARY STENT INTERVENTION N/A 12/27/2017   Procedure: CORONARY STENT INTERVENTION;  Surgeon: Jerry Bush, MD;  Location: [facility];  Service: Cardiovascular;  Laterality: N/A;  . LEFT HEART CATH AND CORONARY ANGIOGRAPHY N/A 12/27/2017   Procedure: LEFT HEART CATH AND CORONARY ANGIOGRAPHY;  Surgeon: Jerry Bush, MD;  Location: [facility];  Service: Cardiovascular;  Laterality: N/A;  . NO PAST SURGERIES       No Known Allergies    Family History  Problem Relation Age of Onset  . Hypertension Father   . Colon cancer Neg Hx      Social History Jerry Roach reports that he quit smoking about 2 years ago. His smoking use included cigarettes. He has a 58.50 pack-year smoking history. He has never used smokeless tobacco. Jerry Roach reports no history of alcohol use.   Review of Systems CONSTITUTIONAL: No weight loss, fever, chills, weakness or fatigue.  HEENT: Eyes: No visual loss, blurred vision, double vision or yellow sclerae.No hearing  loss, sneezing, congestion, runny nose or sore throat.  SKIN: No rash or itching.  CARDIOVASCULAR: per hpi RESPIRATORY: No shortness of breath, cough or sputum.  GASTROINTESTINAL: No anorexia, nausea, vomiting or diarrhea. No abdominal pain or blood.  GENITOURINARY: No burning on urination, no polyuria NEUROLOGICAL: No headache, dizziness, syncope, paralysis, ataxia, numbness or tingling in the extremities. No change in bowel or bladder control.  MUSCULOSKELETAL: No muscle, back pain, joint pain or stiffness.  LYMPHATICS: No enlarged nodes. No history of splenectomy.  PSYCHIATRIC: No history of depression or anxiety.  ENDOCRINOLOGIC: No reports of sweating, cold or heat intolerance. No polyuria or polydipsia.  Marland Kitchen   Physical Examination Today's Vitals   01/26/20 1035  BP: (!) 106/58  Pulse: 72  Temp: 98.2 F (36.8 C)  SpO2: 95%  Weight: 211 lb (95.7 kg)  Height: 5\' 11"  (1.803 m)   Body mass index is 29.43 kg/m.  Gen: resting comfortably, no acute distress HEENT: no scleral icterus, pupils equal round and reactive, no palptable cervical adenopathy,  CV: RRR, 3/6 systolc murmur rusb, no jvd Resp: Clear to auscultation bilaterally GI: abdomen is soft, non-tender, non-distended, normal bowel sounds, no hepatosplenomegaly MSK: extremities are warm, no edema.  Skin: warm, no rash Neuro:  no focal deficits Psych: appropriate affect   Diagnostic Studies 12/2017 echo Study Conclusions  - Left ventricle: The cavity size was normal. Systolic function was normal. The estimated ejection fraction was in the range of 55% to 60%. Wall motion was normal; there were no regional wall motion abnormalities. Features are consistent with a pseudonormal left ventricular filling pattern, with concomitant abnormal relaxation and increased filling pressure (grade 2 diastolic dysfunction). - Aortic valve: Trileaflet; moderately thickened, moderately calcified leaflets. Valve  mobility was restricted. There was moderate stenosis. There was mild regurgitation. Peak velocity (S): 339 cm/s. Mean gradient (S): 28 mm Hg. - Mitral valve: Calcified annulus. - Left atrium: The atrium was moderately dilated. - Right ventricle: The cavity size was mildly dilated. Wall thickness was normal. - Right atrium: The atrium was mildly dilated.  12/2017 cath Conclusions: 1. Severe 2-vessel coronary artery disease, including sequential 80% and 40% proximal and mid LAD stenoses, as well as 50% ostial and 100% distal RCA lesions. 2. Moderate disease involving LCx and dominant OM Ornella Coderre. 3. Collateralization of distal RCA branches and appearance of thrombus in the distal RCA suggests subacute thrombosis. 4. Mildly elevated left ventricular filling pressure. 5. Moderately elevated LVOT gradient, likely due to aortic stenosis. 6. Basal and mid inferior hypokinesis with otherwise preserved left ventricular contraction. 7. Successful PCI to the proximal LAD using Resolute Onyx 3.0 x 18 mm drug-eluting stent with 0% residual stenosis and TIMI-3 flow.  Post cathRecommendations: 1. Medical therapy for moderate LCx disease and occluded distal RCA with left-to-right collaterals. 2. Dual antiplatelet therapy with  aspirin and ticagrelor for at least 12 months. 3. Aggressive secondary prevention. 4. Obtain echocardiogram for further evaluation of suspect aortic stenosis.  12/2017 carotid US Bilateral 1-39% ICA disease    Assessment and Plan   1. CAD -  Medical therapy limited due to bradycardia and prior renal insufficiency previously though has improved, some soft bp's actually today as well - continue current meds, no symptoms   2. Aortic stenosis - moderate to severe AS that is asymptomatic - we will repeat echo in 7/021  3. Hyperlipidemia - continue statn, request pcp labs  F/u 6 months. Echo in July 2021 for aortic stenosis.      Arnoldo Lenis, M.D.

## 2020-02-03 ENCOUNTER — Other Ambulatory Visit (HOSPITAL_COMMUNITY): Payer: Medicare Other

## 2020-02-25 DIAGNOSIS — Z08 Encounter for follow-up examination after completed treatment for malignant neoplasm: Secondary | ICD-10-CM | POA: Diagnosis not present

## 2020-02-25 DIAGNOSIS — L570 Actinic keratosis: Secondary | ICD-10-CM | POA: Diagnosis not present

## 2020-02-25 DIAGNOSIS — Z85828 Personal history of other malignant neoplasm of skin: Secondary | ICD-10-CM | POA: Diagnosis not present

## 2020-02-25 DIAGNOSIS — X32XXXD Exposure to sunlight, subsequent encounter: Secondary | ICD-10-CM | POA: Diagnosis not present

## 2020-05-04 ENCOUNTER — Ambulatory Visit (HOSPITAL_COMMUNITY)
Admission: RE | Admit: 2020-05-04 | Discharge: 2020-05-04 | Disposition: A | Discharge status: Home / Self Care | Payer: Medicare Other | Source: Ambulatory Visit | Attending: Cardiology | Admitting: Cardiology

## 2020-05-04 ENCOUNTER — Other Ambulatory Visit: Payer: Self-pay

## 2020-05-04 DIAGNOSIS — I350 Nonrheumatic aortic (valve) stenosis: Secondary | ICD-10-CM | POA: Diagnosis not present

## 2020-05-04 LAB — ECHOCARDIOGRAM COMPLETE
AR max vel: 1.07 cm2
AV Area VTI: 1.09 cm2
AV Area mean vel: 0.97 cm2
AV Mean grad: 28.3 mmHg
AV Peak grad: 46.7 mmHg
Ao pk vel: 3.42 m/s
Area-P 1/2: 3.31 cm2
P 1/2 time: 394 msec
S' Lateral: 3.52 cm

## 2020-05-04 NOTE — Progress Notes (Signed)
*  PRELIMINARY RESULTS* Echocardiogram 2D Echocardiogram has been performed.  Jerry Roach 05/04/2020, 12:51 PM

## 2020-05-13 ENCOUNTER — Telehealth: Payer: Self-pay | Admitting: *Deleted

## 2020-05-13 NOTE — Telephone Encounter (Signed)
Pt wife (DPR) made aware - f/u scheduled October

## 2020-05-13 NOTE — Telephone Encounter (Signed)
-----   Message from Arnoldo Lenis, MD sent at 05/13/2020  1:25 PM EDT ----- Aortic stenosis remains in the overall moderate range, continue to monitor at this time   Zandra Abts MD

## 2020-07-27 NOTE — Progress Notes (Signed)
Cardiology Office Note    Date:  08/02/2020   ID:  Jerry Roach, Jerry Roach [birth year], MRN [id number]  PCP:  Redmond School, MD  Cardiologist: Carlyle Dolly, MD EPS: None  No chief complaint on file.   History of Present Illness:  Jerry Roach is a 74 y.o. male with history of CAD status post NSTEMI 12/2017 treated with DES to the mid LAD, residual RCA CTO and moderate circumflex disease managed medically.  Normal LVEF 60 to 65% and moderate to severe aortic stenosis mean gradient 28.3 mmHg echo 5/80/9983, chronic diastolic CHF, hyperlipidemia, bilateral carotid stenosis, history of bradycardia so not on beta-blockers and history of renal insufficiency and not on ACE inhibitors  Patient last saw Dr. Harl Bowie 01/26/2020 was doing well without symptoms.  He update the echo as discussed above.   Patient comes in for f/u. 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 Denies, chest pain,dyspnea, palpitations, dizziness or presyncope.  Drinks 5 cups of coffee daily getting extra salt in his diet including pork and beans and sardines a couple times a week.   Past Medical History:  Diagnosis Date  . CAD (coronary artery disease)    a. 12/2017: s/p NSTEMI with DES to Proximal LAD. CTO of RCA with L--> R collaterals noted.   . Hypercholesteremia   . Nocturia   . Weak urinary stream     Past Surgical History:  Procedure Laterality Date  . CATARACT EXTRACTION W/PHACO Left 03/03/2015   Procedure: CATARACT EXTRACTION PHACO AND INTRAOCULAR LENS PLACEMENT (IOC);  Surgeon: Baruch Goldmann, MD;  Location: [facility];  Service: Ophthalmology;  Laterality: Left;  CDE 6.86  . CATARACT EXTRACTION W/PHACO Right 08/31/2015   Procedure: CATARACT EXTRACTION PHACO AND INTRAOCULAR LENS PLACEMENT RIGHT EYE CDE=4.08;  Surgeon: Baruch Goldmann, MD;  Location: [facility];  Service: Ophthalmology;  Laterality: Right;  . COLONOSCOPY  11/05/2011   Procedure: COLONOSCOPY;  Surgeon: Daneil Dolin, MD;  Location: [facility]ITE;  Service: Endoscopy;  Laterality: N/A;  7:30 AM  . CORONARY STENT INTERVENTION N/A 12/27/2017   Procedure: CORONARY STENT INTERVENTION;  Surgeon: Nelva Bush, MD;  Location: [facility];  Service: Cardiovascular;  Laterality: N/A;  . LEFT HEART CATH AND CORONARY ANGIOGRAPHY N/A 12/27/2017   Procedure: LEFT HEART CATH AND CORONARY ANGIOGRAPHY;  Surgeon: Nelva Bush, MD;  Location: [facility];  Service: Cardiovascular;  Laterality: N/A;  . NO PAST SURGERIES      Current Medications: Current Meds  Medication Sig  . acetaminophen (TYLENOL) 325 MG tablet Take 2 tablets (650 mg total) by mouth every 6 (six) hours as needed for mild pain or headache.  . alfuzosin (UROXATRAL) 10 MG 24 hr tablet Take 1 tablet (10 mg total) by mouth daily with breakfast.  . aspirin EC 81 MG EC tablet Take 1 tablet (81 mg total) by mouth daily.  Marland Kitchen atorvastatin (LIPITOR) 80 MG tablet Take 1 tablet (80 mg total) by mouth daily at 6 PM.  . mometasone (NASONEX) 50 MCG/ACT nasal spray 2 sprays daily.  . nitroGLYCERIN (NITROSTAT) 0.4 MG SL tablet Place 1 tablet (0.4 mg total) under the tongue every 5 (five) minutes x 3 doses as needed for chest pain.     Allergies:   Patient has no known allergies.   Social History   Socioeconomic History  . Marital status: Married    Spouse name: Not on file  . Number of children: Not on file  . Years of education: Not  on file  . Highest education level: Not on file  Occupational History  . Not on file  Tobacco Use  . Smoking status: Former Smoker    Packs/day: 1.50    Years: 39.00    Pack years: 58.50    Types: Cigarettes    Quit date: 12/26/2017    Years since quitting: 2.6  . Smokeless tobacco: Never Used  Vaping Use  . Vaping Use: Never used  Substance and Sexual Activity  . Alcohol use: No  . Drug use: No  . Sexual activity: Yes    Birth control/protection: None  Other Topics Concern  . Not on file  Social History Narrative  .  Not on file   Social Determinants of Health   Financial Resource Strain:   . Difficulty of Paying Living Expenses: Not on file  Food Insecurity:   . Worried About Charity fundraiser in the Last Year: Not on file  . Ran Out of Food in the Last Year: Not on file  Transportation Needs:   . Lack of Transportation (Medical): Not on file  . Lack of Transportation (Non-Medical): Not on file  Physical Activity:   . Days of Exercise per Week: Not on file  . Minutes of Exercise per Session: Not on file  Stress:   . Feeling of Stress : Not on file  Social Connections:   . Frequency of Communication with Friends and Family: Not on file  . Frequency of Social Gatherings with Friends and Family: Not on file  . Attends Religious Services: Not on file  . Active Member of Clubs or Organizations: Not on file  . Attends Archivist Meetings: Not on file  . Marital Status: Not on file     Family History:  The patient's family history includes COPD in his brother, mother, and sister; Hypertension in his father and sister.   ROS:   Please see the history of present illness.    ROS All other systems reviewed and are negative.   PHYSICAL EXAM:   VS:  BP (!) 116/58   Pulse 73   Ht 5\' 11"  (1.803 m)   Wt 195 lb (88.5 kg)   SpO2 97%   BMI 27.20 kg/m   Physical Exam  GEN: Well nourished, well developed, in no acute distress  Neck: no JVD, carotid bruits, or masses Cardiac:RRR; 3/6 harsh systolic murmur at the left sternal border Respiratory:  clear to auscultation bilaterally, normal work of breathing GI: soft, nontender, nondistended, + BS Ext: without cyanosis, clubbing, or edema, Good distal pulses bilaterally Neuro:  Alert and Oriented x 3 Psych: euthymic mood, full affect  Wt Readings from Last 3 Encounters:  08/02/20 195 lb (88.5 kg)  01/26/20 211 lb (95.7 kg)  12/23/19 212 lb (96.2 kg)      Studies/Labs Reviewed:   EKG:  EKG is not ordered today.    Recent  Labs: 08/31/2019: Creatinine, Ser 1.20   Lipid Panel    Component Value Date/Time   CHOL 133 12/27/2017 0212   TRIG 55 12/27/2017 0212   HDL 45 12/27/2017 0212   CHOLHDL 3.0 12/27/2017 0212   VLDL 11 12/27/2017 0212   LDLCALC 77 12/27/2017 0212    Additional studies/ records that were reviewed today include:  2D echo 05/04/2020 IMPRESSIONS     1. Left ventricular ejection fraction, by estimation, is 60 to 65%. The  left ventricle has normal function. The left ventricle demonstrates  regional wall motion abnormalities (see scoring  diagram/findings for  description). There is mild left ventricular   hypertrophy. Left ventricular diastolic parameters are consistent with  Grade I diastolic dysfunction (impaired relaxation).   2. Right ventricular systolic function is normal. The right ventricular  size is normal. Tricuspid regurgitation signal is inadequate for assessing  PA pressure.   3. Left atrial size was upper normal.   4. The mitral valve is abnormal, mildly calcified with moderate annular  calcification. Trivial mitral valve regurgitation.   5. The aortic valve has an indeterminant number of cusps, possibly  functionally bicuspid. Aortic valve regurgitation is mild. Moderate to  severe aortic valve stenosis (upper end of scale). Aortic valve mean  gradient measures 28.3 mmHg. Aortic valve Vmax   measures 3.42 m/s. Dimentionless index 0.26.   6. The inferior vena cava is normal in size with greater than 50%  respiratory variability, suggesting right atrial pressure of 3 mmHg.   FINDINGS   Left Ventricle: Left ventricular ejection fraction, by estimation, is 60  to 65%. The left ventricle has normal function. The left ventricle  demonstrates regional wall motion abnormalities. The left ventricular  internal cavity size was normal in size.  There is mild left ventricular hypertrophy. Left ventricular diastolic  parameters are consistent with Grade I diastolic dysfunction  (impaired  relaxation).     LV Wall Scoring:  The basal inferior segment is hypokinetic.   Right Ventricle: The right ventricular size is normal. No increase in  right ventricular wall thickness. Right ventricular systolic function is  normal. Tricuspid regurgitation signal is inadequate for assessing PA  pressure.   Left Atrium: Left atrial size was upper normal.   Right Atrium: Right atrial size was normal in size.   Pericardium: There is no evidence of pericardial effusion.   Mitral Valve: The mitral valve is abnormal. There is mild calcification of  the mitral valve leaflet(s). Moderate mitral annular calcification.  Trivial mitral valve regurgitation.   Tricuspid Valve: The tricuspid valve is grossly normal. Tricuspid valve  regurgitation is trivial.   Aortic Valve: The aortic valve has an indeterminant number of cusps.  Aortic valve regurgitation is mild. Aortic regurgitation PHT measures 394  msec. Moderate to severe aortic stenosis is present. Moderate aortic valve  annular calcification. There is  severe calcifcation of the aortic valve. Aortic valve mean gradient  measures 28.3 mmHg. Aortic valve peak gradient measures 46.7 mmHg. Aortic  valve area, by VTI measures 1.09 cm.   Pulmonic Valve: The pulmonic valve was grossly normal. Pulmonic valve  regurgitation is trivial.   Aorta: The aortic root is normal in size and structure.   Venous: The inferior vena cava is normal in size with greater than 50%  respiratory variability, suggesting right atrial pressure of 3 mmHg.  Diagnostic Studies 12/2017 echo Study Conclusions   - Left ventricle: The cavity size was normal. Systolic function was   normal. The estimated ejection fraction was in the range of 55%   to 60%. Wall motion was normal; there were no regional wall   motion abnormalities. Features are consistent with a pseudonormal   left ventricular filling pattern, with concomitant abnormal   relaxation and  increased filling pressure (grade 2 diastolic   dysfunction). - Aortic valve: Trileaflet; moderately thickened, moderately   calcified leaflets. Valve mobility was restricted. There was   moderate stenosis. There was mild regurgitation. Peak velocity   (S): 339 cm/s. Mean gradient (S): 28 mm Hg. - Mitral valve: Calcified annulus. - Left atrium: The atrium was  moderately dilated. - Right ventricle: The cavity size was mildly dilated. Wall   thickness was normal. - Right atrium: The atrium was mildly dilated.   12/2017 cath Conclusions: 1. Severe 2-vessel coronary artery disease, including sequential 80% and 40% proximal and mid LAD stenoses, as well as 50% ostial and 100% distal RCA lesions. 2. Moderate disease involving LCx and dominant OM branch. 3. Collateralization of distal RCA branches and appearance of thrombus in the distal RCA suggests subacute thrombosis. 4. Mildly elevated left ventricular filling pressure. 5. Moderately elevated LVOT gradient, likely due to aortic stenosis. 6. Basal and mid inferior hypokinesis with otherwise preserved left ventricular contraction. 7. Successful PCI to the proximal LAD using Resolute Onyx 3.0 x 18 mm drug-eluting stent with 0% residual stenosis and TIMI-3 flow.   Post cath Recommendations: 1. Medical therapy for moderate LCx disease and occluded distal RCA with left-to-right collaterals. 2. Dual antiplatelet therapy with aspirin and ticagrelor for at least 12 months. 3. Aggressive secondary prevention. 4. Obtain echocardiogram for further evaluation of suspect aortic stenosis.   12/2017 carotid US Bilateral 1-39% ICA disease       ASSESSMENT:    1. Coronary artery disease involving native coronary artery of native heart without angina pectoris   2. Moderate aortic stenosis   3. Chronic diastolic CHF (congestive heart failure) ([place])   4. Bradycardia   5. Mixed hyperlipidemia   6. Bilateral carotid artery stenosis      PLAN:  In  order of problems listed above:  CAD status post NSTEMI 12/2017 treated with DES to the LAD, residual RCA CTO and moderate circumflex disease managed medically, LVEF 60 to 65% 04/2020. Not taking his ASA daily because of easy bleeding. Asked him to take this daily. Decrease caffeine intake.  Moderate to severe aortic stenosis mean gradient 28.3 mmHg on echo 05/04/2020 f/u with Dr. Harl Bowie in 6 months.  Chronic diastolic CHF no problems and eats high salt diet. Asked him to watch his sodium intake.  Check labs today.  History of bradycardia not on beta-blockers  History of chronic renal insufficiency not on ACE inhibitor  Hyperlipidemia on lipitor-needs labs  Carotid stenosis mild bilateral 03/2019    Medication Adjustments/Labs and Tests Ordered: Current medicines are reviewed at length with the patient today.  Concerns regarding medicines are outlined above.  Medication changes, Labs and Tests ordered today are listed in the Patient Instructions below. Patient Instructions   Medication Instructions:  Your physician recommends that you continue on your current medications as directed. Please refer to the Current Medication list given to you today.  Take Aspirin Daily   *If you need a refill on your cardiac medications before your next appointment, please call your pharmacy*   Lab Work: Your physician recommends that you return for lab work in: Today   If you have labs (blood work) drawn today and your tests are completely normal, you will receive your results only by: Marland Kitchen MyChart Message (if you have MyChart) OR . A paper copy in the mail If you have any lab test that is abnormal or we need to change your treatment, we will call you to review the results.   Testing/Procedures: NONE    Follow-Up: At [facility], you and your health needs are our priority.  As part of our continuing mission to provide you with exceptional heart care, we have created designated Provider Care  Teams.  These Care Teams include your primary Cardiologist (physician) and Advanced Practice Providers (APPs -  Physician Assistants and Nurse Practitioners) who all work together to provide you with the care you need, when you need it.  We recommend signing up for the patient portal called "MyChart".  Sign up information is provided on this After Visit Summary.  MyChart is used to connect with patients for Virtual Visits (Telemedicine).  Patients are able to view lab/test results, encounter notes, upcoming appointments, etc.  Non-urgent messages can be sent to your provider as well.   To learn more about what you can do with MyChart, go to [email or web address].    Your next appointment:   6 month(s)  The format for your next appointment:   In Person  Provider:   Carlyle Dolly, MD   Other Instructions Thank you for choosing [facility]!  Two Gram Sodium Diet 2000 mg  What is Sodium? Sodium is a mineral found naturally in many foods. The most significant source of sodium in the diet is table salt, which is about 40% sodium.  Processed, convenience, and preserved foods also contain a large amount of sodium.  The body needs only 500 mg of sodium daily to function,  A normal diet provides more than enough sodium even if you do not use salt.  Why Limit Sodium? A build up of sodium in the body can cause thirst, increased blood pressure, shortness of breath, and water retention.  Decreasing sodium in the diet can reduce edema and risk of heart attack or stroke associated with high blood pressure.  Keep in mind that there are many other factors involved in these health problems.  Heredity, obesity, lack of exercise, cigarette smoking, stress and what you eat all play a role.  General Guidelines:  Do not add salt at the table or in cooking.  One teaspoon of salt contains over 2 grams of sodium.  Read food labels  Avoid processed and convenience foods  Ask your dietitian before  eating any foods not dicussed in the menu planning guidelines  Consult your physician if you wish to use a salt substitute or a sodium containing medication such as antacids.  Limit milk and milk products to 16 oz (2 cups) per day.  Shopping Hints:  READ LABELS!! "Dietetic" does not necessarily mean low sodium.  Salt and other sodium ingredients are often added to foods during processing.   Menu Planning Guidelines Food Group Choose More Often Avoid  Beverages (see also the milk group All fruit juices, low-sodium, salt-free vegetables juices, low-sodium carbonated beverages Regular vegetable or tomato juices, commercially softened water used for drinking or cooking  Breads and Cereals Enriched white, wheat, rye and pumpernickel bread, hard rolls and dinner rolls; muffins, cornbread and waffles; most dry cereals, cooked cereal without added salt; unsalted crackers and breadsticks; low sodium or homemade bread crumbs Bread, rolls and crackers with salted tops; quick breads; instant hot cereals; pancakes; commercial bread stuffing; self-rising flower and biscuit mixes; regular bread crumbs or cracker crumbs  Desserts and Sweets Desserts and sweets mad with mild should be within allowance Instant pudding mixes and cake mixes  Fats Butter or margarine; vegetable oils; unsalted salad dressings, regular salad dressings limited to 1 Tbs; light, sour and heavy cream Regular salad dressings containing bacon fat, bacon bits, and salt pork; snack dips made with instant soup mixes or processed cheese; salted nuts  Fruits Most fresh, frozen and canned fruits Fruits processed with salt or sodium-containing ingredient (some dried fruits are processed with sodium sulfites        Vegetables  Fresh, frozen vegetables and low- sodium canned vegetables Regular canned vegetables, sauerkraut, pickled vegetables, and others prepared in brine; frozen vegetables in sauces; vegetables seasoned with ham, bacon or salt  pork  Condiments, Sauces, Miscellaneous  Salt substitute with physician's approval; pepper, herbs, spices; vinegar, lemon or lime juice; hot pepper sauce; garlic powder, onion powder, low sodium soy sauce (1 Tbs.); low sodium condiments (ketchup, chili sauce, mustard) in limited amounts (1 tsp.) fresh ground horseradish; unsalted tortilla chips, pretzels, potato chips, popcorn, salsa (1/4 cup) Any seasoning made with salt including garlic salt, celery salt, onion salt, and seasoned salt; sea salt, rock salt, kosher salt; meat tenderizers; monosodium glutamate; mustard, regular soy sauce, barbecue, sauce, chili sauce, teriyaki sauce, steak sauce, Worcestershire sauce, and most flavored vinegars; canned gravy and mixes; regular condiments; salted snack foods, olives, picles, relish, horseradish sauce, catsup   Food preparation: Try these seasonings Meats:    Pork Sage, onion Serve with applesauce  Chicken Poultry seasoning, thyme, parsley Serve with cranberry sauce  Lamb Curry powder, rosemary, garlic, thyme Serve with mint sauce or jelly  Veal Marjoram, basil Serve with current jelly, cranberry sauce  Beef Pepper, bay leaf Serve with dry mustard, unsalted chive butter  Fish [place] leaf, dill Serve with unsalted lemon butter, unsalted parsley butter  Vegetables:    Asparagus Lemon juice   Broccoli Lemon juice   Carrots Mustard dressing parsley, mint, nutmeg, glazed with unsalted butter and sugar   Green beans Marjoram, lemon juice, nutmeg,dill seed   Tomatoes Basil, marjoram, onion   Spice /blend for [employer]" 4 tsp ground thyme 1 tsp ground sage 3 tsp ground rosemary 4 tsp ground marjoram   Test your knowledge 1. A product that says "Salt Free" may still contain sodium. True or False 2. Garlic Powder and Hot Pepper Sauce an be used as alternative seasonings.True or False 3. Processed foods have more sodium than fresh foods.  True or False 4. Canned Vegetables have less sodium than froze  True or False  WAYS TO DECREASE YOUR SODIUM INTAKE 1. Avoid the use of added salt in cooking and at the table.  Table salt (and other prepared seasonings which contain salt) is probably one of the greatest sources of sodium in the diet.  Unsalted foods can gain flavor from the sweet, sour, and butter taste sensations of herbs and spices.  Instead of using salt for seasoning, try the following seasonings with the foods listed.  Remember: how you use them to enhance natural food flavors is limited only by your creativity... Allspice-Meat, fish, eggs, fruit, peas, red and yellow vegetables Almond Extract-Fruit baked goods Anise Seed-Sweet breads, fruit, carrots, beets, cottage cheese, cookies (tastes like licorice) Basil-Meat, fish, eggs, vegetables, rice, vegetables salads, soups, sauces Bay Leaf-Meat, fish, stews, poultry Burnet-Salad, vegetables (cucumber-like flavor) Caraway Seed-Bread, cookies, cottage cheese, meat, vegetables, cheese, rice Cardamon-Baked goods, fruit, soups Celery Powder or seed-Salads, salad dressings, sauces, meatloaf, soup, bread.Do not use  celery salt Chervil-Meats, salads, fish, eggs, vegetables, cottage cheese (parsley-like flavor) Chili Power-Meatloaf, chicken cheese, corn, eggplant, egg dishes Chives-Salads cottage cheese, egg dishes, soups, vegetables, sauces Cilantro-Salsa, casseroles Cinnamon-Baked goods, fruit, pork, lamb, chicken, carrots Cloves-Fruit, baked goods, fish, pot roast, green beans, beets, carrots Coriander-Pastry, cookies, meat, salads, cheese (lemon-orange flavor) Cumin-Meatloaf, fish,cheese, eggs, cabbage,fruit pie (caraway flavor) [employer], fruit, eggs, fish, poultry, cottage cheese, vegetables Dill Seed-Meat, cottage cheese, poultry, vegetables, fish, salads, bread Fennel Seed-Bread, cookies, apples, pork, eggs, fish, beets, cabbage, cheese, Licorice-like flavor Garlic-(buds or powder) Salads,  meat, poultry, fish, bread, butter,  vegetables, potatoes.Do not  use garlic salt Ginger-Fruit, vegetables, baked goods, meat, fish, poultry Horseradish Root-Meet, vegetables, butter Lemon Juice or Extract-Vegetables, fruit, tea, baked goods, fish salads Mace-Baked goods fruit, vegetables, fish, poultry (taste like nutmeg) Maple Extract-Syrups Marjoram-Meat, chicken, fish, vegetables, breads, green salads (taste like Sage) Mint-Tea, lamb, sherbet, vegetables, desserts, carrots, cabbage Mustard, Dry or Seed-Cheese, eggs, meats, vegetables, poultry Nutmeg-Baked goods, fruit, chicken, eggs, vegetables, desserts Onion Powder-Meat, fish, poultry, vegetables, cheese, eggs, bread, rice salads (Do not use   Onion salt) Orange Extract-Desserts, baked goods Oregano-Pasta, eggs, cheese, onions, pork, lamb, fish, chicken, vegetables, green salads Paprika-Meat, fish, poultry, eggs, cheese, vegetables Parsley Flakes-Butter, vegetables, meat fish, poultry, eggs, bread, salads (certain forms may   Contain sodium Pepper-Meat fish, poultry, vegetables, eggs Peppermint Extract-Desserts, baked goods Poppy Seed-Eggs, bread, cheese, fruit dressings, baked goods, noodles, vegetables, cottage  [employer], poultry, meat, fish, cauliflower, turnips,eggs bread Saffron-Rice, bread, veal, chicken, fish, eggs Sage-Meat, fish, poultry, onions, eggplant, tomateos, pork, stews Savory-Eggs, salads, poultry, meat, rice, vegetables, soups, pork Tarragon-Meat, poultry, fish, eggs, butter, vegetables (licorice-like flavor)  Thyme-Meat, poultry, fish, eggs, vegetables, (clover-like flavor), sauces, soups Tumeric-Salads, butter, eggs, fish, rice, vegetables (saffron-like flavor) Vanilla Extract-Baked goods, candy Vinegar-Salads, vegetables, meat marinades Walnut Extract-baked goods, candy  2. Choose your Foods Wisely   The following is a list of foods to avoid which are high in sodium:  Meats-Avoid all smoked,  canned, salt cured, dried and kosher meat and fish as well as Anchovies   [employer] meats:Bologna, Liverwurst, Pastrami Canned meat or fish  Marinated herring Caviar    Pepperoni Corned Beef   Pizza Dried chipped beef  Salami Frozen breaded fish or meat Salt pork Frankfurters or hot dogs  Sardines Gefilte fish   Sausage Ham (boiled ham, Proscuitto Smoked butt    spiced ham)   Spam      TV Dinners Vegetables Canned vegetables (Regular) Relish Canned mushrooms  Sauerkraut Olives    Tomato juice Pickles  Bakery and Dessert Products Canned puddings  Cream pies Cheesecake   Decorated cakes Cookies  Beverages/Juices Tomato juice, regular  Gatorade   V-8 vegetable juice, regular  Breads and Cereals Biscuit mixes   Salted potato chips, corn chips, pretzels Bread stuffing mixes  Salted crackers and rolls Pancake and waffle mixes Self-rising flour  Seasonings Accent    Meat sauces Barbecue sauce  Meat tenderizer Catsup    Monosodium glutamate (MSG) Celery salt   Onion salt Chili sauce   Prepared mustard Garlic salt   Salt, seasoned salt, sea salt Gravy mixes   Soy sauce Horseradish   Steak sauce Ketchup   Tartar sauce Lite salt    Teriyaki sauce Marinade mixes   Worcestershire sauce  Others Baking powder   Cocoa and cocoa mixes Baking soda   Commercial casserole mixes Candy-caramels, chocolate  Dehydrated soups    Bars, fudge,nougats  Instant rice and pasta mixes Canned broth or soup  Maraschino cherries Cheese, aged and processed cheese and cheese spreads  Learning Assessment Quiz  Indicated T (for True) or F (for False) for each of the following statements:  1. _____ Fresh fruits and vegetables and unprocessed grains are generally low in sodium 2. _____ Water may contain a considerable amount of sodium, depending on the source 3. _____ You can always tell if a food is high in sodium by tasting it 4. _____ Certain laxatives my be high in sodium and  should be avoided unless prescribed   by a physician or pharmacist 5. _____ Salt substitutes may be used freely by anyone on a sodium restricted diet 6. _____ Sodium is present in table salt, food additives and as a natural component of   most foods 7. _____ Table salt is approximately 90% sodium 8. _____ Limiting sodium intake may help prevent excess fluid accumulation in the body 9. _____ On a sodium-restricted diet, seasonings such as bouillon soy sauce, and    cooking wine should be used in place of table salt 10. _____ On an ingredient list, a product which lists monosodium glutamate as the first   ingredient is an appropriate food to include on a low sodium diet  Circle the best answer(s) to the following statements (Hint: there may be more than one correct answer)  11. On a low-sodium diet, some acceptable snack items are:    A. Olives  F. Bean dip   K. Grapefruit juice    B. Salted Pretzels G. Commercial Popcorn   L. Canned peaches    C. Carrot Sticks  H. Bouillon   M. Unsalted nuts   D. [place] fries  I. Peanut butter crackers N. Salami   E. Sweet pickles J. Tomato Juice   O. Pizza  12.  Seasonings that may be used freely on a reduced - sodium diet include   A. Lemon wedges F.Monosodium glutamate K. Celery seed    B.Soysauce   G. Pepper   L. Mustard powder   C. Sea salt  H. Cooking wine  M. Onion flakes   D. Vinegar  E. Prepared horseradish N. Salsa   E. Sage   J. Worcestershire sauce  O. [address]      Sumner Boast, PA-C  08/02/2020 12:59 PM    [facility] [place], [place], [place]  [address] Phone: [phone]; Fax: [phone]

## 2020-08-01 ENCOUNTER — Ambulatory Visit: Payer: Medicare Other | Admitting: Cardiology

## 2020-08-02 ENCOUNTER — Other Ambulatory Visit (HOSPITAL_COMMUNITY)
Admission: RE | Admit: 2020-08-02 | Discharge: 2020-08-02 | Disposition: A | Discharge status: Home / Self Care | Payer: Medicare Other | Source: Ambulatory Visit | Attending: Physician Assistant | Admitting: Physician Assistant

## 2020-08-02 ENCOUNTER — Other Ambulatory Visit: Payer: Self-pay

## 2020-08-02 ENCOUNTER — Encounter: Payer: Self-pay | Admitting: Physician Assistant

## 2020-08-02 ENCOUNTER — Ambulatory Visit (INDEPENDENT_AMBULATORY_CARE_PROVIDER_SITE_OTHER): Payer: Medicare Other | Admitting: Physician Assistant

## 2020-08-02 VITALS — BP 116/58 | HR 73 | Ht 71.0 in | Wt 195.0 lb

## 2020-08-02 DIAGNOSIS — E782 Mixed hyperlipidemia: Secondary | ICD-10-CM | POA: Insufficient documentation

## 2020-08-02 DIAGNOSIS — R001 Bradycardia, unspecified: Secondary | ICD-10-CM | POA: Insufficient documentation

## 2020-08-02 DIAGNOSIS — I350 Nonrheumatic aortic (valve) stenosis: Secondary | ICD-10-CM | POA: Insufficient documentation

## 2020-08-02 DIAGNOSIS — I2510 Atherosclerotic heart disease of native coronary artery without angina pectoris: Secondary | ICD-10-CM

## 2020-08-02 DIAGNOSIS — I5032 Chronic diastolic (congestive) heart failure: Secondary | ICD-10-CM

## 2020-08-02 DIAGNOSIS — I6523 Occlusion and stenosis of bilateral carotid arteries: Secondary | ICD-10-CM | POA: Diagnosis not present

## 2020-08-02 LAB — CBC
HCT: 41.1 % (ref 39.0–52.0)
Hemoglobin: 13.6 g/dL (ref 13.0–17.0)
MCH: 32.5 pg (ref 26.0–34.0)
MCHC: 33.1 g/dL (ref 30.0–36.0)
MCV: 98.3 fL (ref 80.0–100.0)
Platelets: 124 10*3/uL — ABNORMAL LOW (ref 150–400)
RBC: 4.18 MIL/uL — ABNORMAL LOW (ref 4.22–5.81)
RDW: 12.8 % (ref 11.5–15.5)
WBC: 6.5 10*3/uL (ref 4.0–10.5)
nRBC: 0 % (ref 0.0–0.2)

## 2020-08-02 LAB — COMPREHENSIVE METABOLIC PANEL
ALT: 17 U/L (ref 0–44)
AST: 21 U/L (ref 15–41)
Albumin: 3.7 g/dL (ref 3.5–5.0)
Alkaline Phosphatase: 52 U/L (ref 38–126)
Anion gap: 9 (ref 5–15)
BUN: 15 mg/dL (ref 8–23)
CO2: 26 mmol/L (ref 22–32)
Calcium: 9.5 mg/dL (ref 8.9–10.3)
Chloride: 104 mmol/L (ref 98–111)
Creatinine, Ser: 1.05 mg/dL (ref 0.61–1.24)
GFR, Estimated: >60 mL/min (ref >60)
Glucose, Bld: 103 mg/dL — ABNORMAL HIGH (ref 70–99)
Potassium: 3.9 mmol/L (ref 3.5–5.1)
Sodium: 139 mmol/L (ref 135–145)
Total Bilirubin: 0.8 mg/dL (ref 0.3–1.2)
Total Protein: 6.6 g/dL (ref 6.5–8.1)

## 2020-08-02 LAB — LIPID PANEL
Cholesterol: 113 mg/dL (ref 0–200)
HDL: 43 mg/dL (ref >40)
LDL Cholesterol: 59 mg/dL (ref 0–99)
Total CHOL/HDL Ratio: 2.6 RATIO
Triglycerides: 56 mg/dL (ref <150)
VLDL: 11 mg/dL (ref 0–40)

## 2020-08-02 NOTE — Patient Instructions (Signed)
Medication Instructions:  Your physician recommends that you continue on your current medications as directed. Please refer to the Current Medication list given to you today.  Take Aspirin Daily   *If you need a refill on your cardiac medications before your next appointment, please call your pharmacy*   Lab Work: Your physician recommends that you return for lab work in: Today   If you have labs (blood work) drawn today and your tests are completely normal, you will receive your results only by:  [place] (if you have MyChart) OR  A paper copy in the mail If you have any lab test that is abnormal or we need to change your treatment, we will call you to review the results.   Testing/Procedures: NONE    Follow-Up: At [facility], you and your health needs are our priority.  As part of our continuing mission to provide you with exceptional heart care, we have created designated Provider Care Teams.  These Care Teams include your primary Cardiologist (physician) and Advanced Practice Providers (APPs -  Physician Assistants and Nurse Practitioners) who all work together to provide you with the care you need, when you need it.  We recommend signing up for the patient portal called "MyChart".  Sign up information is provided on this After Visit Summary.  MyChart is used to connect with patients for Virtual Visits (Telemedicine).  Patients are able to view lab/test results, encounter notes, upcoming appointments, etc.  Non-urgent messages can be sent to your provider as well.   To learn more about what you can do with MyChart, go to [email or web address].    Your next appointment:   6 month(s)  The format for your next appointment:   In Person  Provider:   Carlyle Dolly, MD   Other Instructions Thank you for choosing [facility]!  Two Gram Sodium Diet 2000 mg  What is Sodium? Sodium is a mineral found naturally in many foods. The most significant source of  sodium in the diet is table salt, which is about 40% sodium.  Processed, convenience, and preserved foods also contain a large amount of sodium.  The body needs only 500 mg of sodium daily to function,  A normal diet provides more than enough sodium even if you do not use salt.  Why Limit Sodium? A build up of sodium in the body can cause thirst, increased blood pressure, shortness of breath, and water retention.  Decreasing sodium in the diet can reduce edema and risk of heart attack or stroke associated with high blood pressure.  Keep in mind that there are many other factors involved in these health problems.  Heredity, obesity, lack of exercise, cigarette smoking, stress and what you eat all play a role.  General Guidelines:  Do not add salt at the table or in cooking.  One teaspoon of salt contains over 2 grams of sodium.  Read food labels  Avoid processed and convenience foods  Ask your dietitian before eating any foods not dicussed in the menu planning guidelines  Consult your physician if you wish to use a salt substitute or a sodium containing medication such as antacids.  Limit milk and milk products to 16 oz (2 cups) per day.  Shopping Hints:  READ LABELS!! "Dietetic" does not necessarily mean low sodium.  Salt and other sodium ingredients are often added to foods during processing.   Menu Planning Guidelines Food Group Choose More Often Avoid  Beverages (see also the milk group All fruit  juices, low-sodium, salt-free vegetables juices, low-sodium carbonated beverages Regular vegetable or tomato juices, commercially softened water used for drinking or cooking  Breads and Cereals Enriched white, wheat, rye and pumpernickel bread, hard rolls and dinner rolls; muffins, cornbread and waffles; most dry cereals, cooked cereal without added salt; unsalted crackers and breadsticks; low sodium or homemade bread crumbs Bread, rolls and crackers with salted tops; quick breads; instant hot  cereals; pancakes; commercial bread stuffing; self-rising flower and biscuit mixes; regular bread crumbs or cracker crumbs  Desserts and Sweets Desserts and sweets mad with mild should be within allowance Instant pudding mixes and cake mixes  Fats Butter or margarine; vegetable oils; unsalted salad dressings, regular salad dressings limited to 1 Tbs; light, sour and heavy cream Regular salad dressings containing bacon fat, bacon bits, and salt pork; snack dips made with instant soup mixes or processed cheese; salted nuts  Fruits Most fresh, frozen and canned fruits Fruits processed with salt or sodium-containing ingredient (some dried fruits are processed with sodium sulfites        Vegetables Fresh, frozen vegetables and low- sodium canned vegetables Regular canned vegetables, sauerkraut, pickled vegetables, and others prepared in brine; frozen vegetables in sauces; vegetables seasoned with ham, bacon or salt pork  Condiments, Sauces, Miscellaneous  Salt substitute with physician's approval; pepper, herbs, spices; vinegar, lemon or lime juice; hot pepper sauce; garlic powder, onion powder, low sodium soy sauce (1 Tbs.); low sodium condiments (ketchup, chili sauce, mustard) in limited amounts (1 tsp.) fresh ground horseradish; unsalted tortilla chips, pretzels, potato chips, popcorn, salsa (1/4 cup) Any seasoning made with salt including garlic salt, celery salt, onion salt, and seasoned salt; sea salt, rock salt, kosher salt; meat tenderizers; monosodium glutamate; mustard, regular soy sauce, barbecue, sauce, chili sauce, teriyaki sauce, steak sauce, Worcestershire sauce, and most flavored vinegars; canned gravy and mixes; regular condiments; salted snack foods, olives, picles, relish, horseradish sauce, catsup   Food preparation: Try these seasonings Meats:    Pork Sage, onion Serve with applesauce  Chicken Poultry seasoning, thyme, parsley Serve with cranberry sauce  Lamb Curry powder, rosemary,  garlic, thyme Serve with mint sauce or jelly  Veal Marjoram, basil Serve with current jelly, cranberry sauce  Beef Pepper, bay leaf Serve with dry mustard, unsalted chive butter  Fish [place] leaf, dill Serve with unsalted lemon butter, unsalted parsley butter  Vegetables:    Asparagus Lemon juice   Broccoli Lemon juice   Carrots Mustard dressing parsley, mint, nutmeg, glazed with unsalted butter and sugar   Green beans Marjoram, lemon juice, nutmeg,dill seed   Tomatoes Basil, marjoram, onion   Spice /blend for [employer]" 4 tsp ground thyme 1 tsp ground sage 3 tsp ground rosemary 4 tsp ground marjoram   Test your knowledge 1. A product that says "Salt Free" may still contain sodium. True or False 2. Garlic Powder and Hot Pepper Sauce an be used as alternative seasonings.True or False 3. Processed foods have more sodium than fresh foods.  True or False 4. Canned Vegetables have less sodium than froze True or False  WAYS TO DECREASE YOUR SODIUM INTAKE 1. Avoid the use of added salt in cooking and at the table.  Table salt (and other prepared seasonings which contain salt) is probably one of the greatest sources of sodium in the diet.  Unsalted foods can gain flavor from the sweet, sour, and butter taste sensations of herbs and spices.  Instead of using salt for seasoning, try the following seasonings with the  foods listed.  Remember: how you use them to enhance natural food flavors is limited only by your creativity... Allspice-Meat, fish, eggs, fruit, peas, red and yellow vegetables Almond Extract-Fruit baked goods Anise Seed-Sweet breads, fruit, carrots, beets, cottage cheese, cookies (tastes like licorice) Basil-Meat, fish, eggs, vegetables, rice, vegetables salads, soups, sauces Bay Leaf-Meat, fish, stews, poultry Burnet-Salad, vegetables (cucumber-like flavor) Caraway Seed-Bread, cookies, cottage cheese, meat, vegetables, cheese, rice Cardamon-Baked goods, fruit, soups Celery Powder  or seed-Salads, salad dressings, sauces, meatloaf, soup, bread.Do not use  celery salt Chervil-Meats, salads, fish, eggs, vegetables, cottage cheese (parsley-like flavor) Chili Power-Meatloaf, chicken cheese, corn, eggplant, egg dishes Chives-Salads cottage cheese, egg dishes, soups, vegetables, sauces Cilantro-Salsa, casseroles Cinnamon-Baked goods, fruit, pork, lamb, chicken, carrots Cloves-Fruit, baked goods, fish, pot roast, green beans, beets, carrots Coriander-Pastry, cookies, meat, salads, cheese (lemon-orange flavor) Cumin-Meatloaf, fish,cheese, eggs, cabbage,fruit pie (caraway flavor) [employer], fruit, eggs, fish, poultry, cottage cheese, vegetables Dill Seed-Meat, cottage cheese, poultry, vegetables, fish, salads, bread Fennel Seed-Bread, cookies, apples, pork, eggs, fish, beets, cabbage, cheese, Licorice-like flavor Garlic-(buds or powder) Salads, meat, poultry, fish, bread, butter, vegetables, potatoes.Do not  use garlic salt Ginger-Fruit, vegetables, baked goods, meat, fish, poultry Horseradish Root-Meet, vegetables, butter Lemon Juice or Extract-Vegetables, fruit, tea, baked goods, fish salads Mace-Baked goods fruit, vegetables, fish, poultry (taste like nutmeg) Maple Extract-Syrups Marjoram-Meat, chicken, fish, vegetables, breads, green salads (taste like Sage) Mint-Tea, lamb, sherbet, vegetables, desserts, carrots, cabbage Mustard, Dry or Seed-Cheese, eggs, meats, vegetables, poultry Nutmeg-Baked goods, fruit, chicken, eggs, vegetables, desserts Onion Powder-Meat, fish, poultry, vegetables, cheese, eggs, bread, rice salads (Do not use   Onion salt) Orange Extract-Desserts, baked goods Oregano-Pasta, eggs, cheese, onions, pork, lamb, fish, chicken, vegetables, green salads Paprika-Meat, fish, poultry, eggs, cheese, vegetables Parsley Flakes-Butter, vegetables, meat fish, poultry, eggs, bread, salads (certain forms may   Contain sodium Pepper-Meat fish, poultry,  vegetables, eggs Peppermint Extract-Desserts, baked goods Poppy Seed-Eggs, bread, cheese, fruit dressings, baked goods, noodles, vegetables, cottage  [employer], poultry, meat, fish, cauliflower, turnips,eggs bread Saffron-Rice, bread, veal, chicken, fish, eggs Sage-Meat, fish, poultry, onions, eggplant, tomateos, pork, stews Savory-Eggs, salads, poultry, meat, rice, vegetables, soups, pork Tarragon-Meat, poultry, fish, eggs, butter, vegetables (licorice-like flavor)  Thyme-Meat, poultry, fish, eggs, vegetables, (clover-like flavor), sauces, soups Tumeric-Salads, butter, eggs, fish, rice, vegetables (saffron-like flavor) Vanilla Extract-Baked goods, candy Vinegar-Salads, vegetables, meat marinades Walnut Extract-baked goods, candy  2. Choose your Foods Wisely   The following is a list of foods to avoid which are high in sodium:  Meats-Avoid all smoked, canned, salt cured, dried and kosher meat and fish as well as Anchovies   [employer] meats:Bologna, Liverwurst, Pastrami Canned meat or fish  Marinated herring Caviar    Pepperoni Corned Beef   Pizza Dried chipped beef  Salami Frozen breaded fish or meat Salt pork Frankfurters or hot dogs  Sardines Gefilte fish   Sausage Ham (boiled ham, Proscuitto Smoked butt    spiced ham)   Spam      TV Dinners Vegetables Canned vegetables (Regular) Relish Canned mushrooms  Sauerkraut Olives    Tomato juice Pickles  Bakery and Dessert Products Canned puddings  Cream pies Cheesecake   Decorated cakes Cookies  Beverages/Juices Tomato juice, regular  Gatorade   V-8 vegetable juice, regular  Breads and Cereals Biscuit mixes   Salted potato chips, corn chips, pretzels Bread stuffing mixes  Salted crackers and rolls Pancake and waffle mixes Self-rising flour  Seasonings Accent    Meat sauces Barbecue sauce  Meat tenderizer Catsup    Monosodium glutamate (MSG) Celery salt   Onion  salt Chili sauce   Prepared mustard Garlic salt   Salt, seasoned salt, sea salt Gravy mixes   Soy sauce Horseradish   Steak sauce Ketchup   Tartar sauce Lite salt    Teriyaki sauce Marinade mixes   Worcestershire sauce  Others Baking powder   Cocoa and cocoa mixes Baking soda   Commercial casserole mixes Candy-caramels, chocolate  Dehydrated soups    Bars, fudge,nougats  Instant rice and pasta mixes Canned broth or soup  Maraschino cherries Cheese, aged and processed cheese and cheese spreads  Learning Assessment Quiz  Indicated T (for True) or F (for False) for each of the following statements:  1. _____ Fresh fruits and vegetables and unprocessed grains are generally low in sodium 2. _____ Water may contain a considerable amount of sodium, depending on the source 3. _____ You can always tell if a food is high in sodium by tasting it 4. _____ Certain laxatives my be high in sodium and should be avoided unless prescribed   by a physician or pharmacist 5. _____ Salt substitutes may be used freely by anyone on a sodium restricted diet 6. _____ Sodium is present in table salt, food additives and as a natural component of   most foods 7. _____ Table salt is approximately 90% sodium 8. _____ Limiting sodium intake may help prevent excess fluid accumulation in the body 9. _____ On a sodium-restricted diet, seasonings such as bouillon soy sauce, and    cooking wine should be used in place of table salt 10. _____ On an ingredient list, a product which lists monosodium glutamate as the first   ingredient is an appropriate food to include on a low sodium diet  Circle the best answer(s) to the following statements (Hint: there may be more than one correct answer)  11. On a low-sodium diet, some acceptable snack items are:    A. Olives  F. Bean dip   K. Grapefruit juice    B. Salted Pretzels G. Commercial Popcorn   L. Canned peaches    C. Carrot Sticks  H. Bouillon   M. Unsalted  nuts   D. [place] fries  I. Peanut butter crackers N. Salami   E. Sweet pickles J. Tomato Juice   O. Pizza  12.  Seasonings that may be used freely on a reduced - sodium diet include   A. Lemon wedges F.Monosodium glutamate K. Celery seed    B.Soysauce   G. Pepper   L. Mustard powder   C. Sea salt  H. Cooking wine  M. Onion flakes   D. Vinegar  E. Prepared horseradish N. Salsa   E. Sage   J. Worcestershire sauce  O. [place]

## 2020-08-15 ENCOUNTER — Encounter (HOSPITAL_COMMUNITY): Payer: Self-pay | Admitting: Emergency Medicine

## 2020-08-15 ENCOUNTER — Other Ambulatory Visit: Payer: Self-pay

## 2020-08-15 ENCOUNTER — Emergency Department (HOSPITAL_COMMUNITY): Payer: Medicare Other

## 2020-08-15 ENCOUNTER — Emergency Department (HOSPITAL_COMMUNITY)
Admission: EM | Admit: 2020-08-15 | Discharge: 2020-08-15 | Disposition: A | Discharge status: Home / Self Care | Payer: Medicare Other | Attending: Emergency Medicine | Admitting: Emergency Medicine

## 2020-08-15 DIAGNOSIS — G8929 Other chronic pain: Secondary | ICD-10-CM

## 2020-08-15 DIAGNOSIS — R079 Chest pain, unspecified: Secondary | ICD-10-CM | POA: Diagnosis not present

## 2020-08-15 DIAGNOSIS — I2510 Atherosclerotic heart disease of native coronary artery without angina pectoris: Secondary | ICD-10-CM | POA: Insufficient documentation

## 2020-08-15 DIAGNOSIS — R0789 Other chest pain: Secondary | ICD-10-CM | POA: Diagnosis not present

## 2020-08-15 DIAGNOSIS — S299XXA Unspecified injury of thorax, initial encounter: Secondary | ICD-10-CM | POA: Diagnosis not present

## 2020-08-15 DIAGNOSIS — R072 Precordial pain: Secondary | ICD-10-CM | POA: Diagnosis not present

## 2020-08-15 DIAGNOSIS — W228XXA Striking against or struck by other objects, initial encounter: Secondary | ICD-10-CM | POA: Insufficient documentation

## 2020-08-15 DIAGNOSIS — Z87891 Personal history of nicotine dependence: Secondary | ICD-10-CM | POA: Insufficient documentation

## 2020-08-15 DIAGNOSIS — M25511 Pain in right shoulder: Secondary | ICD-10-CM | POA: Insufficient documentation

## 2020-08-15 DIAGNOSIS — S4991XA Unspecified injury of right shoulder and upper arm, initial encounter: Secondary | ICD-10-CM | POA: Diagnosis not present

## 2020-08-15 IMAGING — DX DG HUMERUS 2V *R*
3 series · 3 of 3 positions shown · non-contrast
Comparison: None.

CLINICAL DATA: Recently hit by bull with right arm pain, initial
encounter

EXAM:
RIGHT HUMERUS - 2+ VIEW

[humerus ap]
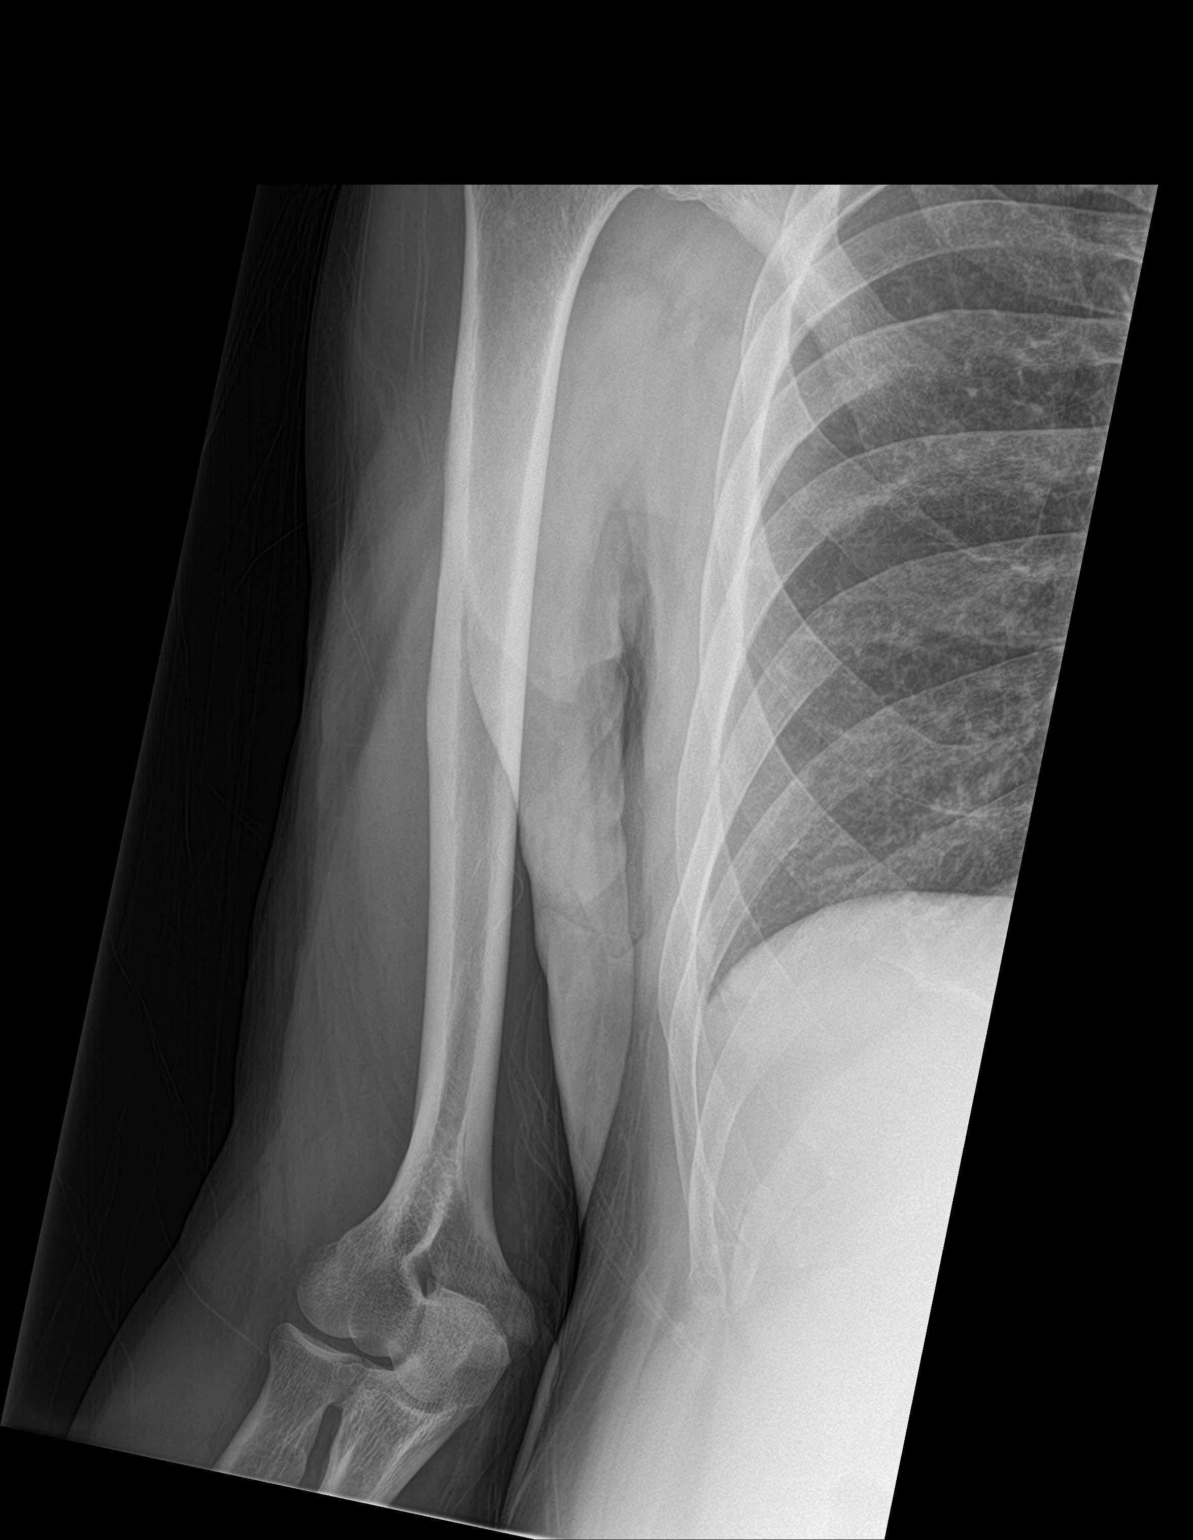

[humerus lat (1 of 2)]
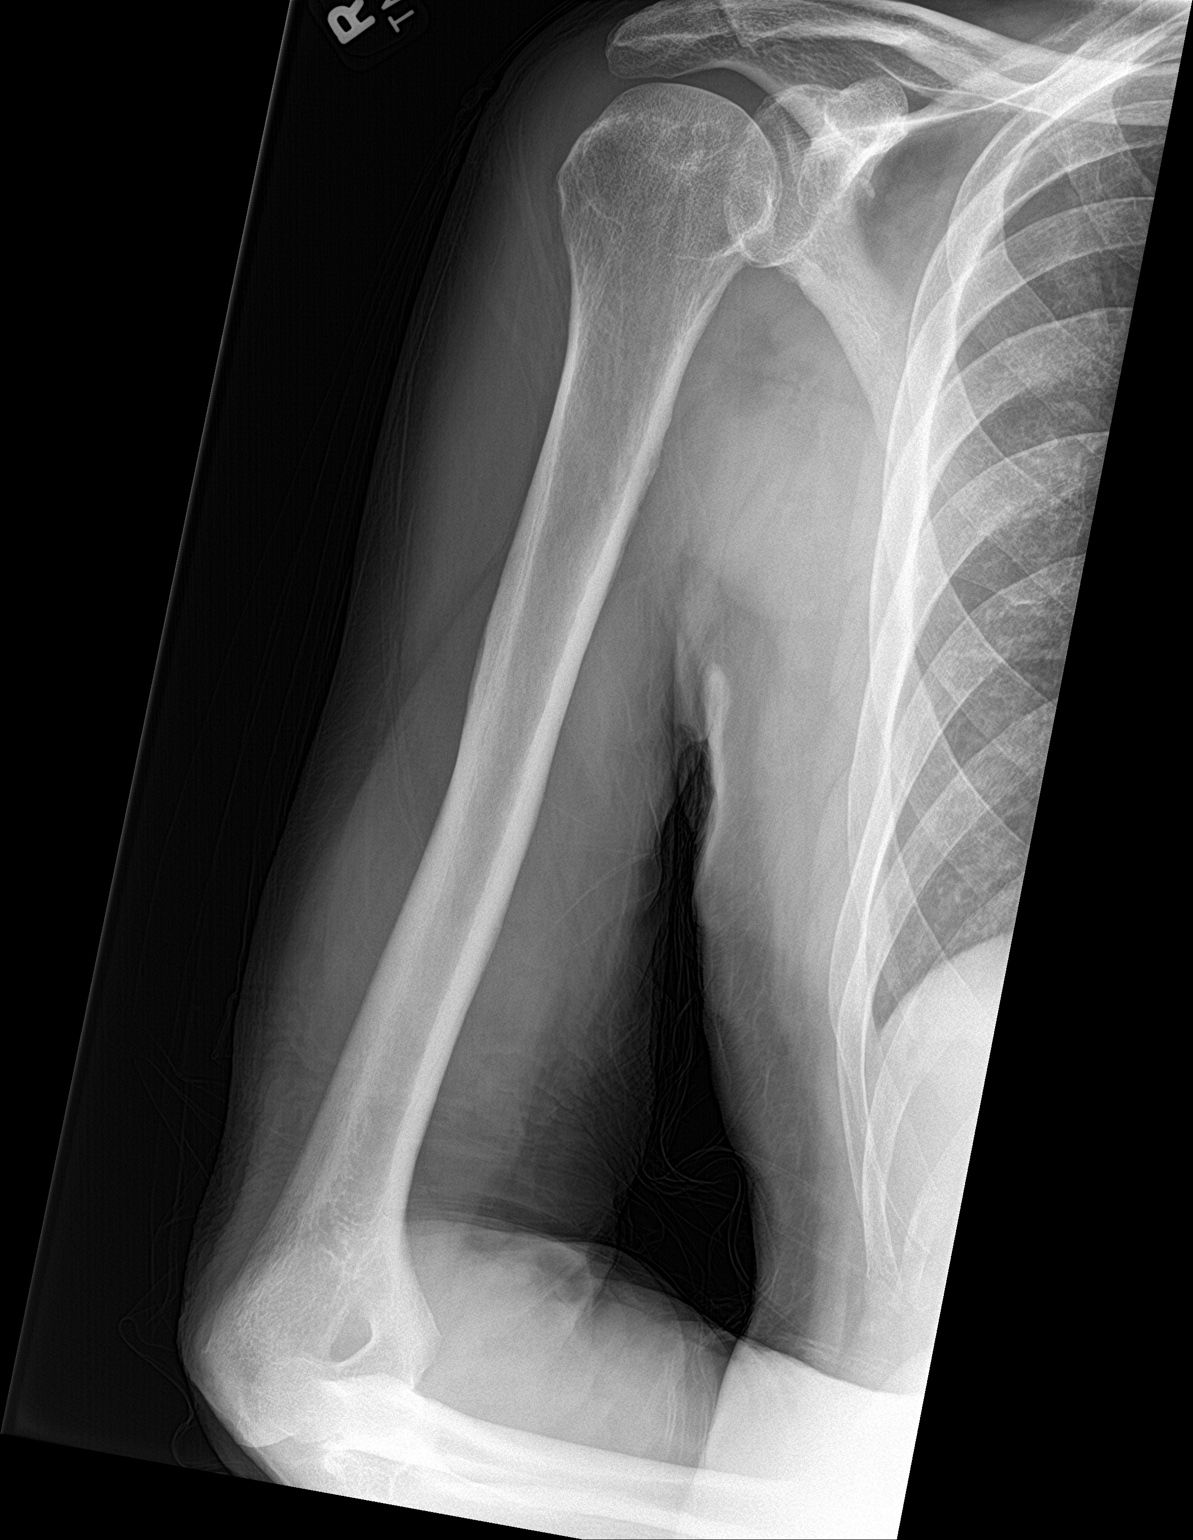

[humerus lat (2 of 2)]
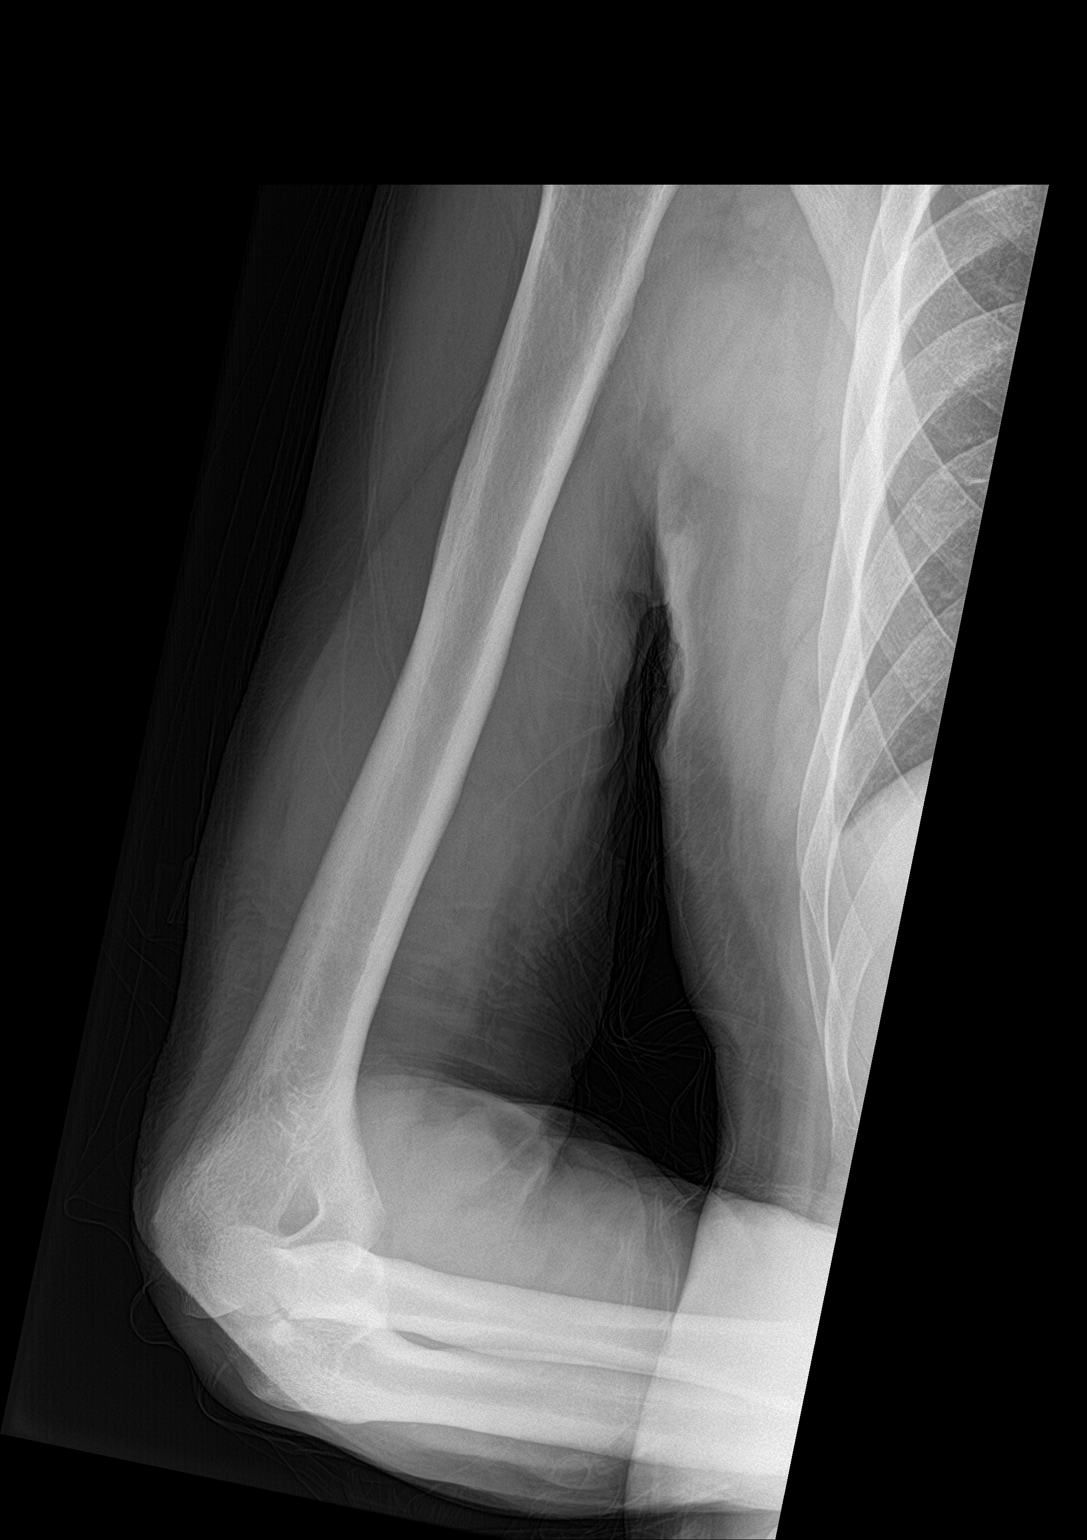

[3 of 3 positions shown; findings below may reference images not displayed]

FINDINGS: There is no evidence of fracture or other focal bone lesions. Soft
tissues are unremarkable.
IMPRESSION: No acute abnormality noted.

## 2020-08-15 IMAGING — DX DG CHEST 2V
2 series · 2 of 2 positions shown · non-contrast
Comparison: [DATE]

CLINICAL DATA: Midsternal chest pain following being hit by a bull,
initial encounter

EXAM:
CHEST - 2 VIEW

[chest pa]
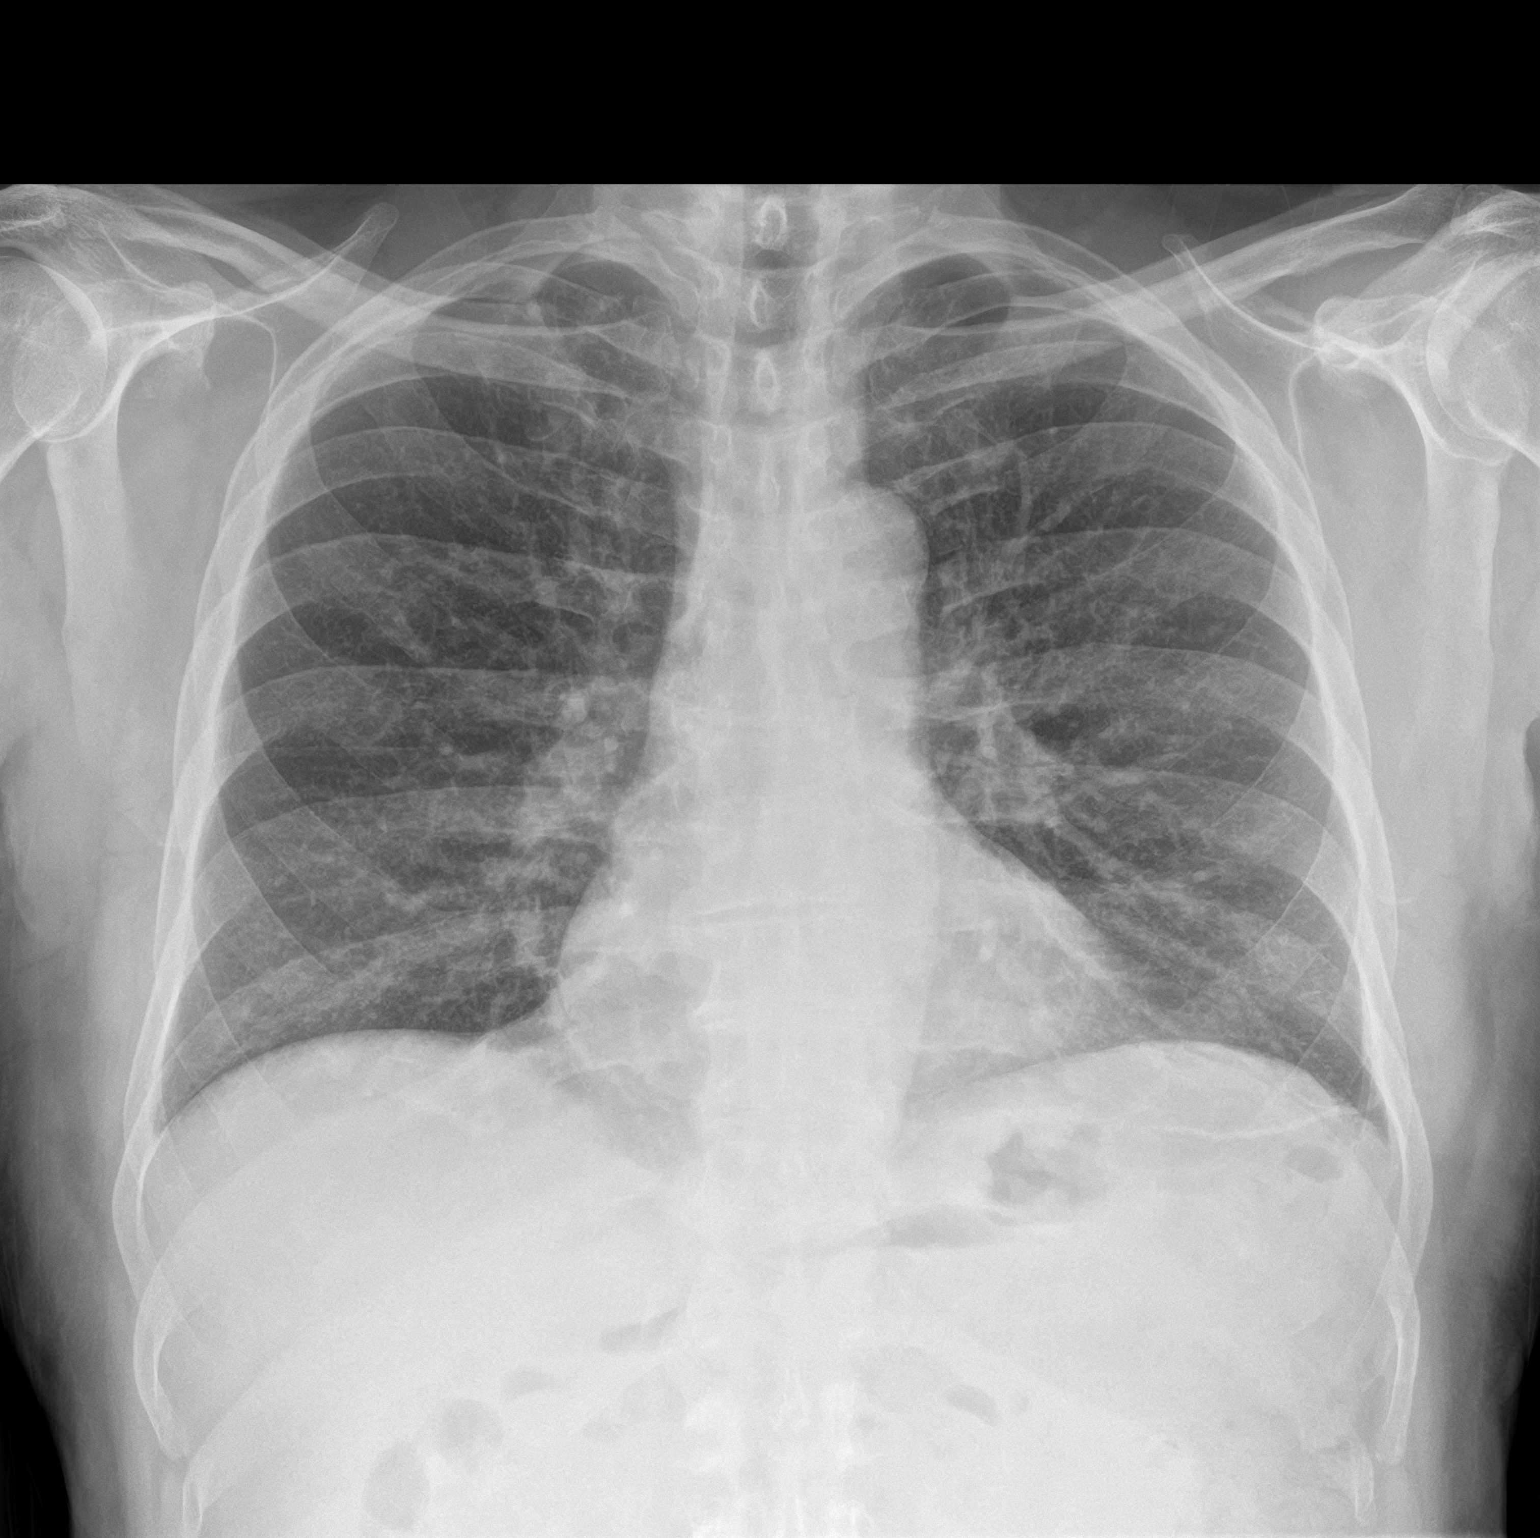

[chest lat]
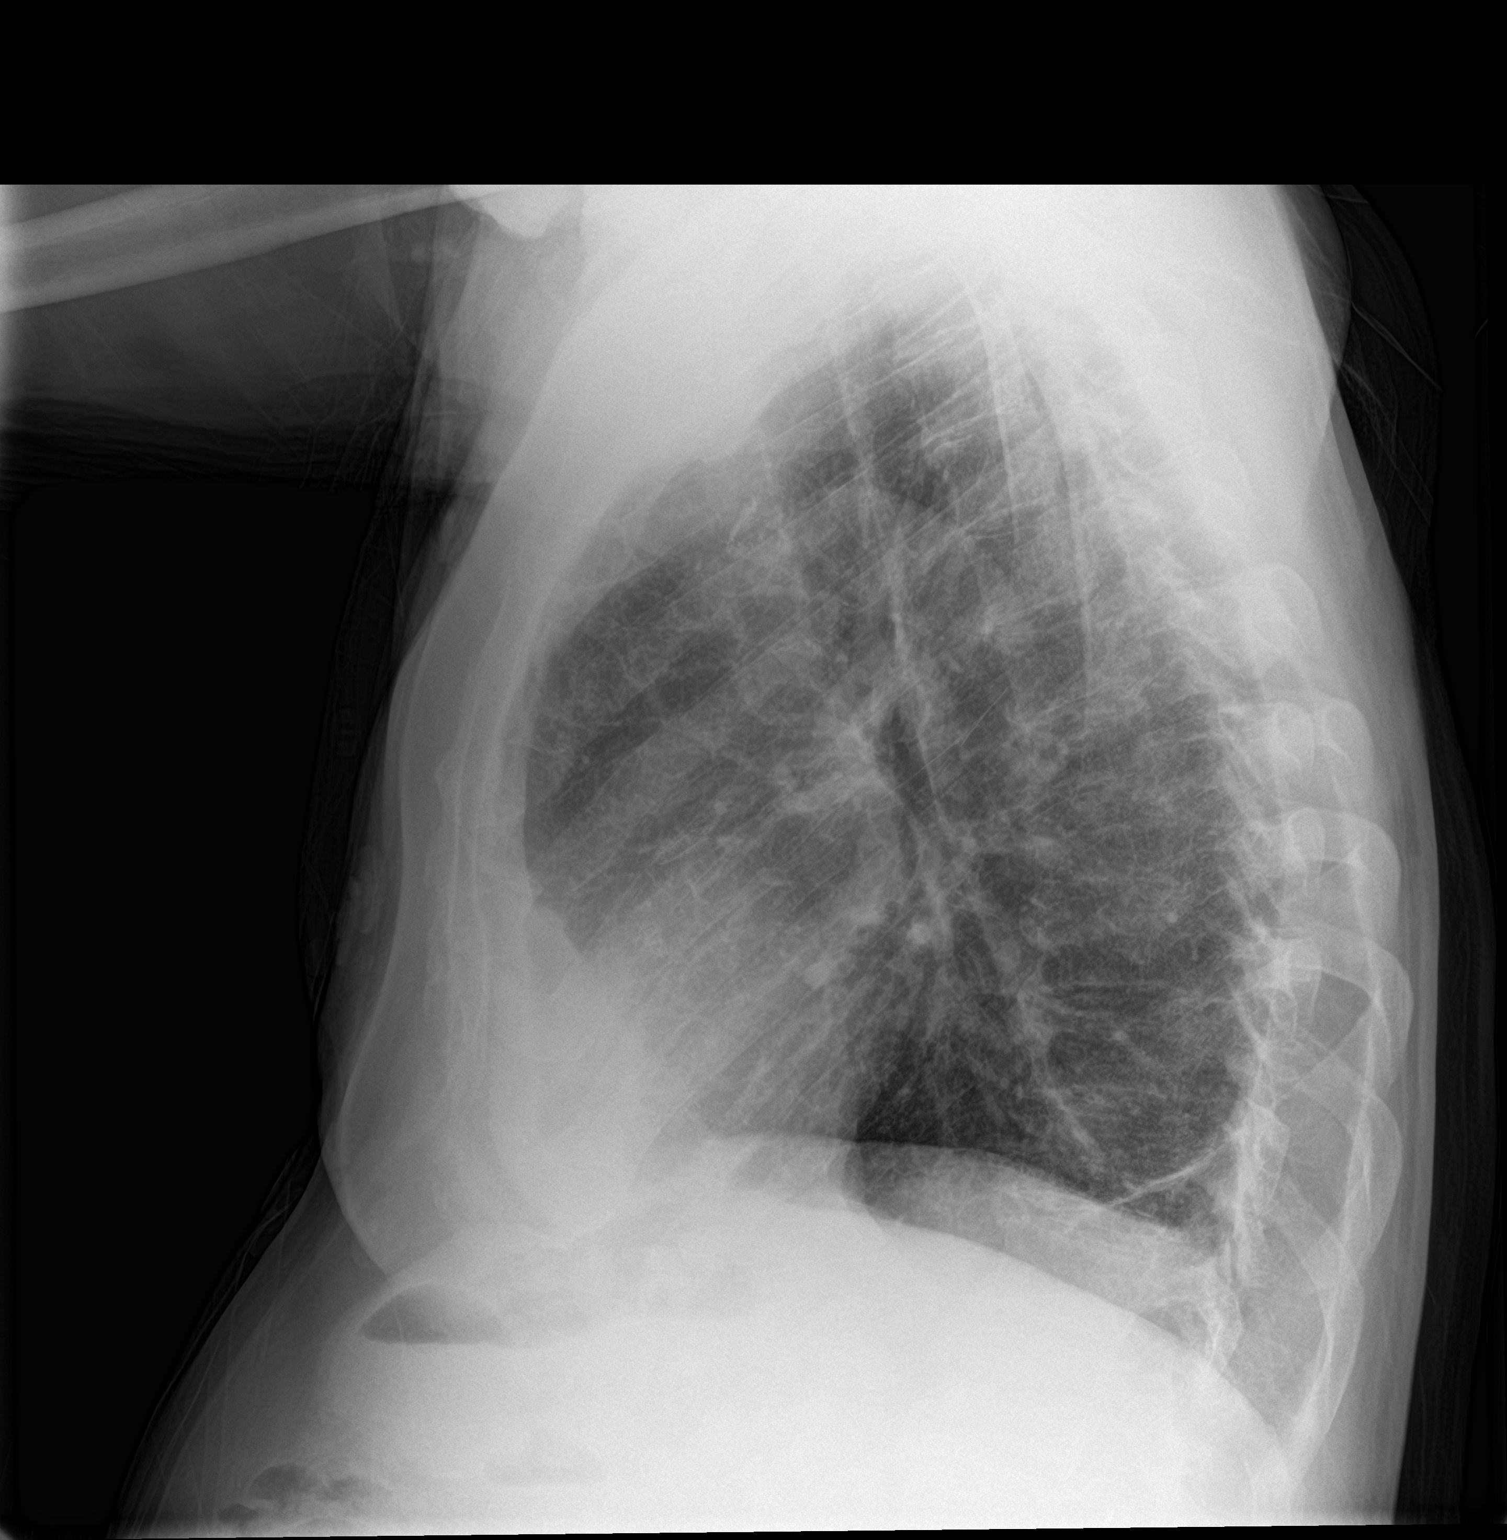

[2 of 2 positions shown; findings below may reference images not displayed]

FINDINGS: Cardiac shadow is within normal limits. The lungs are well aerated
bilaterally. No focal infiltrate or sizable effusion is seen. No
acute bony abnormality is seen.
IMPRESSION: No active cardiopulmonary disease.

## 2020-08-15 NOTE — ED Triage Notes (Signed)
Patient mid-sternal chest pain after being head-butted in chest by bull yesterday. Patient had some shortness of breath with incident but denies any at this time. Patient states pain worse with movement. Denies any nausea, vomiting, or dizziness. Patient also c/o right upper arm pain. Patient does report hx of cardiac stent.

## 2020-08-15 NOTE — ED Provider Notes (Signed)
[facility] Emergency Department Provider Note MRN:  [id number]  Arrival date & time: 08/15/20     Chief Complaint   Chest Injury   History of Present Illness   Jerry Roach is a 74 y.o. year-old male with a history of CAD presenting to the ED with chief complaint of chest injury.  Patient was struck by the head of a bull yesterday in the center of the chest, has had continued soreness since that time.  No shortness of breath.  Patient also has continued right shoulder pain from a fall a few months ago that he would also like evaluated.  Pain is worse with motion or palpation of the right shoulder.  Denies any headache or vision change, no head trauma recently, no loss of consciousness, no abdominal pain, no numbness or weakness to the arms or legs.  Review of Systems  A complete 10 system review of systems was obtained and all systems are negative except as noted in the HPI and PMH.   Patient's Health History    Past Medical History:  Diagnosis Date  . CAD (coronary artery disease)    a. 12/2017: s/p NSTEMI with DES to Proximal LAD. CTO of RCA with L--> R collaterals noted.   . Hypercholesteremia   . Nocturia   . Weak urinary stream     Past Surgical History:  Procedure Laterality Date  . CATARACT EXTRACTION W/PHACO Left 03/03/2015   Procedure: CATARACT EXTRACTION PHACO AND INTRAOCULAR LENS PLACEMENT (IOC);  Surgeon: Baruch Goldmann, MD;  Location: [facility];  Service: Ophthalmology;  Laterality: Left;  CDE 6.86  . CATARACT EXTRACTION W/PHACO Right 08/31/2015   Procedure: CATARACT EXTRACTION PHACO AND INTRAOCULAR LENS PLACEMENT RIGHT EYE CDE=4.08;  Surgeon: Baruch Goldmann, MD;  Location: [facility];  Service: Ophthalmology;  Laterality: Right;  . COLONOSCOPY  11/05/2011   Procedure: COLONOSCOPY;  Surgeon: Daneil Dolin, MD;  [facility];  Service: Endoscopy;  Laterality: N/A;  7:30 AM  . CORONARY STENT INTERVENTION N/A 12/27/2017   Procedure: CORONARY  STENT INTERVENTION;  Surgeon: Nelva Bush, MD;  Location: [place] [facility];  Service: Cardiovascular;  Laterality: N/A;  . LEFT HEART CATH AND CORONARY ANGIOGRAPHY N/A 12/27/2017   Procedure: LEFT HEART CATH AND CORONARY ANGIOGRAPHY;  Surgeon: Nelva Bush, MD;  Location: [facility];  Service: Cardiovascular;  Laterality: N/A;  . NO PAST SURGERIES      Family History  Problem Relation Age of Onset  . Hypertension Father   . COPD Mother   . Hypertension Sister   . COPD Sister   . COPD Brother   . Colon cancer Neg Hx     Social History   Socioeconomic History  . Marital status: Married    Spouse name: Not on file  . Number of children: Not on file  . Years of education: Not on file  . Highest education level: Not on file  Occupational History  . Not on file  Tobacco Use  . Smoking status: Former Smoker    Packs/day: 1.50    Years: 39.00    Pack years: 58.50    Types: Cigarettes    Quit date: 12/26/2017    Years since quitting: 2.6  . Smokeless tobacco: Never Used  Vaping Use  . Vaping Use: Never used  Substance and Sexual Activity  . Alcohol use: No  . Drug use: No  . Sexual activity: Yes    Birth control/protection: None  Other Topics Concern  .  Not on file  Social History Narrative  . Not on file   Social Determinants of Health   Financial Resource Strain:   . Difficulty of Paying Living Expenses: Not on file  Food Insecurity:   . Worried About Charity fundraiser in the Last Year: Not on file  . Ran Out of Food in the Last Year: Not on file  Transportation Needs:   . Lack of Transportation (Medical): Not on file  . Lack of Transportation (Non-Medical): Not on file  Physical Activity:   . Days of Exercise per Week: Not on file  . Minutes of Exercise per Session: Not on file  Stress:   . Feeling of Stress : Not on file  Social Connections:   . Frequency of Communication with Friends and Family: Not on file  . Frequency of Social Gatherings  with Friends and Family: Not on file  . Attends Religious Services: Not on file  . Active Member of Clubs or Organizations: Not on file  . Attends Archivist Meetings: Not on file  . Marital Status: Not on file  Intimate Partner Violence:   . Fear of Current or Ex-Partner: Not on file  . Emotionally Abused: Not on file  . Physically Abused: Not on file  . Sexually Abused: Not on file     Physical Exam   Vitals:   08/15/20 0942  BP: 113/61  Pulse: 65  Resp: 18  Temp: 97.7 F (36.5 C)  SpO2: 98%    CONSTITUTIONAL: Well-appearing, NAD NEURO:  Alert and oriented x 3, no focal deficits EYES:  eyes equal and reactive ENT/NECK:  no LAD, no JVD CARDIO: Regular rate, well-perfused, normal S1 and S2; mild tenderness palpation to the sternum PULM:  CTAB no wheezing or rhonchi GI/GU:  normal bowel sounds, non-distended, non-tender MSK/SPINE:  No gross deformities, no edema, tenderness to palpation to the right anterior shoulder with reduced range of motion due to pain SKIN:  no rash, atraumatic PSYCH:  Appropriate speech and behavior  *Additional and/or pertinent findings included in MDM below  Diagnostic and Interventional Summary    EKG Interpretation  Date/Time:    Ventricular Rate:    PR Interval:    QRS Duration:   QT Interval:    QTC Calculation:   R Axis:     Text Interpretation:        Labs Reviewed - No data to display  DG Chest 2 View  Final Result    DG Humerus Right  Final Result      Medications - No data to display   Procedures  /  Critical Care Procedures  ED Course and Medical Decision Making  I have reviewed the triage vital signs, the nursing notes, and pertinent available records from the EMR.  Listed above are laboratory and imaging tests that I personally ordered, reviewed, and interpreted and then considered in my medical decision making (see below for details).  Chronic shoulder pain question rotator cuff injury versus  arthritis, nothing to suggest septic joint today, x-rays are reassuring of both the shoulder, humerus, and chest, no pneumothorax, appropriate for discharge with Ortho follow-up.       Barth Kirks. Sedonia Small, MD [place] [email or web address]  Final Clinical Impressions(s) / ED Diagnoses     ICD-10-CM   1. Chest injury, initial encounter  S29.9XXA   2. Chronic right shoulder pain  M25.511    G89.29     ED Discharge Orders  None       Discharge Instructions Discussed with and Provided to Patient:     Discharge Instructions     You were evaluated in the Emergency Department and after careful evaluation, we did not find any emergent condition requiring admission or further testing in the hospital.  Your exam/testing today is overall reassuring.  X-rays did not show any broken bones.  Regarding your continued shoulder pain, we recommend follow-up with the orthopedic specialist.  Please return to the Emergency Department if you experience any worsening of your condition.   Thank you for allowing [place] to be a part of your care.      Maudie Flakes, MD 08/15/20 1121

## 2020-08-15 NOTE — Discharge Instructions (Addendum)
You were evaluated in the Emergency Department and after careful evaluation, we did not find any emergent condition requiring admission or further testing in the hospital.  Your exam/testing today is overall reassuring.  X-rays did not show any broken bones.  Regarding your continued shoulder pain, we recommend follow-up with the orthopedic specialist.  Please return to the Emergency Department if you experience any worsening of your condition.   Thank you for allowing [place] to be a part of your care.

## 2020-08-29 DIAGNOSIS — D0439 Carcinoma in situ of skin of other parts of face: Secondary | ICD-10-CM | POA: Diagnosis not present

## 2020-08-29 DIAGNOSIS — L570 Actinic keratosis: Secondary | ICD-10-CM | POA: Diagnosis not present

## 2020-08-29 DIAGNOSIS — Z85828 Personal history of other malignant neoplasm of skin: Secondary | ICD-10-CM | POA: Diagnosis not present

## 2020-08-29 DIAGNOSIS — X32XXXD Exposure to sunlight, subsequent encounter: Secondary | ICD-10-CM | POA: Diagnosis not present

## 2020-08-29 DIAGNOSIS — Z08 Encounter for follow-up examination after completed treatment for malignant neoplasm: Secondary | ICD-10-CM | POA: Diagnosis not present

## 2020-10-10 DIAGNOSIS — X32XXXD Exposure to sunlight, subsequent encounter: Secondary | ICD-10-CM | POA: Diagnosis not present

## 2020-10-10 DIAGNOSIS — Z85828 Personal history of other malignant neoplasm of skin: Secondary | ICD-10-CM | POA: Diagnosis not present

## 2020-10-10 DIAGNOSIS — Z08 Encounter for follow-up examination after completed treatment for malignant neoplasm: Secondary | ICD-10-CM | POA: Diagnosis not present

## 2020-10-10 DIAGNOSIS — L570 Actinic keratosis: Secondary | ICD-10-CM | POA: Diagnosis not present

## 2020-12-08 DIAGNOSIS — E663 Overweight: Secondary | ICD-10-CM | POA: Diagnosis not present

## 2020-12-08 DIAGNOSIS — I2510 Atherosclerotic heart disease of native coronary artery without angina pectoris: Secondary | ICD-10-CM | POA: Diagnosis not present

## 2020-12-08 DIAGNOSIS — I358 Other nonrheumatic aortic valve disorders: Secondary | ICD-10-CM | POA: Diagnosis not present

## 2020-12-08 DIAGNOSIS — E7849 Other hyperlipidemia: Secondary | ICD-10-CM | POA: Diagnosis not present

## 2020-12-08 DIAGNOSIS — Z6828 Body mass index (BMI) 28.0-28.9, adult: Secondary | ICD-10-CM | POA: Diagnosis not present

## 2020-12-08 DIAGNOSIS — Z1389 Encounter for screening for other disorder: Secondary | ICD-10-CM | POA: Diagnosis not present

## 2020-12-08 DIAGNOSIS — Z1331 Encounter for screening for depression: Secondary | ICD-10-CM | POA: Diagnosis not present

## 2020-12-08 DIAGNOSIS — Z0001 Encounter for general adult medical examination with abnormal findings: Secondary | ICD-10-CM | POA: Diagnosis not present

## 2020-12-13 ENCOUNTER — Other Ambulatory Visit: Payer: Self-pay | Admitting: Cardiology

## 2020-12-28 ENCOUNTER — Ambulatory Visit (INDEPENDENT_AMBULATORY_CARE_PROVIDER_SITE_OTHER): Payer: Medicare Other | Admitting: Urology

## 2020-12-28 ENCOUNTER — Other Ambulatory Visit: Payer: Self-pay

## 2020-12-28 ENCOUNTER — Encounter: Payer: Self-pay | Admitting: Urology

## 2020-12-28 VITALS — BP 134/70 | HR 89 | Temp 97.7°F | Ht 71.0 in | Wt 200.0 lb

## 2020-12-28 DIAGNOSIS — R3912 Poor urinary stream: Secondary | ICD-10-CM

## 2020-12-28 DIAGNOSIS — N401 Enlarged prostate with lower urinary tract symptoms: Secondary | ICD-10-CM

## 2020-12-28 DIAGNOSIS — N138 Other obstructive and reflux uropathy: Secondary | ICD-10-CM

## 2020-12-28 DIAGNOSIS — R351 Nocturia: Secondary | ICD-10-CM | POA: Diagnosis not present

## 2020-12-28 LAB — URINALYSIS, ROUTINE W REFLEX MICROSCOPIC
Bilirubin, UA: NEGATIVE
Glucose, UA: NEGATIVE
Ketones, UA: NEGATIVE
Leukocytes,UA: NEGATIVE
Nitrite, UA: NEGATIVE
Protein,UA: NEGATIVE
RBC, UA: NEGATIVE
Specific Gravity, UA: 1.015 (ref 1.005–1.030)
Urobilinogen, Ur: 0.2 mg/dL (ref 0.2–1.0)
pH, UA: 7 (ref 5.0–7.5)

## 2020-12-28 MED ORDER — ALFUZOSIN HCL ER 10 MG PO TB24
10.0000 mg | ORAL_TABLET | Freq: Every day | ORAL | 3 refills | Status: DC
Start: 2020-12-28 — End: 2021-12-27

## 2020-12-28 NOTE — Progress Notes (Signed)
12/28/2020 10:22 AM   Jerry Roach 04/09/1946 [id number]  Referring provider: Redmond School, MD [address] [place],  [place] [address]  followup nocturia ns weak urinary stream  HPI: [name] is a 74yo here for followup for nocturia and weak urinary stream. IPSS 11 with QOL 1. He is not bothered by his urination. He is currently taking uroxatral 10mg  . No other complaints today   PMH: Past Medical History:  Diagnosis Date  . CAD (coronary artery disease)    a. 12/2017: s/p NSTEMI with DES to Proximal LAD. CTO of RCA with L--> R collaterals noted.   . Hypercholesteremia   . Nocturia   . Weak urinary stream     Surgical History: Past Surgical History:  Procedure Laterality Date  . CATARACT EXTRACTION W/PHACO Left 03/03/2015   Procedure: CATARACT EXTRACTION PHACO AND INTRAOCULAR LENS PLACEMENT (IOC);  Surgeon: Baruch Goldmann, MD;  Location: [facility];  Service: Ophthalmology;  Laterality: Left;  CDE 6.86  . CATARACT EXTRACTION W/PHACO Right 08/31/2015   Procedure: CATARACT EXTRACTION PHACO AND INTRAOCULAR LENS PLACEMENT RIGHT EYE CDE=4.08;  Surgeon: Baruch Goldmann, MD;  Location: [facility];  Service: Ophthalmology;  Laterality: Right;  . COLONOSCOPY  11/05/2011   Procedure: COLONOSCOPY;  Surgeon: Daneil Dolin, MD;  [facility];  Service: Endoscopy;  Laterality: N/A;  7:30 AM  . CORONARY STENT INTERVENTION N/A 12/27/2017   Procedure: CORONARY STENT INTERVENTION;  Surgeon: Nelva Bush, MD;  Location: [place] [facility];  Service: Cardiovascular;  Laterality: N/A;  . LEFT HEART CATH AND CORONARY ANGIOGRAPHY N/A 12/27/2017   Procedure: LEFT HEART CATH AND CORONARY ANGIOGRAPHY;  Surgeon: Nelva Bush, MD;  Location: [place] [facility];  Service: Cardiovascular;  Laterality: N/A;  . NO PAST SURGERIES      Home Medications:  Allergies as of 12/28/2020   No Known Allergies     Medication List       Accurate as of December 28, 2020 10:22 AM. If you have any  questions, ask your nurse or doctor.        STOP taking these medications   gabapentin 300 MG capsule Commonly known as: NEURONTIN Stopped by: Nicolette Bang, MD     TAKE these medications   acetaminophen 325 MG tablet Commonly known as: TYLENOL Take 2 tablets (650 mg total) by mouth every 6 (six) hours as needed for mild pain or headache.   alfuzosin 10 MG 24 hr tablet Commonly known as: UROXATRAL Take 1 tablet (10 mg total) by mouth daily with breakfast.   aspirin 81 MG EC tablet Take 1 tablet (81 mg total) by mouth daily.   atorvastatin 80 MG tablet Commonly known as: LIPITOR TAKE 1 TABLET(80 MG) BY MOUTH DAILY AT 6 PM   mometasone 50 MCG/ACT nasal spray Commonly known as: NASONEX 2 sprays daily.   nitroGLYCERIN 0.4 MG SL tablet Commonly known as: NITROSTAT Place 1 tablet (0.4 mg total) under the tongue every 5 (five) minutes x 3 doses as needed for chest pain.       Allergies: No Known Allergies  Family History: Family History  Problem Relation Age of Onset  . Hypertension Father   . COPD Mother   . Hypertension Sister   . COPD Sister   . COPD Brother   . Colon cancer Neg Hx     Social History:  reports that he quit smoking about 3 years ago. His smoking use included cigarettes. He has a 58.50 pack-year smoking history. He has  never used smokeless tobacco. He reports that he does not drink alcohol and does not use drugs.  ROS: All other review of systems were reviewed and are negative except what is noted above in HPI  Physical Exam: BP 134/70   Pulse 89   Temp 97.7 F (36.5 C)   Ht 5\' 11"  (1.803 m)   Wt 200 lb (90.7 kg)   BMI 27.89 kg/m   Constitutional:  Alert and oriented, No acute distress. HEENT: [place] AT, moist mucus membranes.  Trachea midline, no masses. Cardiovascular: No clubbing, cyanosis, or edema. Respiratory: Normal respiratory effort, no increased work of breathing. GI: Abdomen is soft, nontender, nondistended, no abdominal  masses GU: No CVA tenderness.  Lymph: No cervical or inguinal lymphadenopathy. Skin: No rashes, bruises or suspicious lesions. Neurologic: Grossly intact, no focal deficits, moving all 4 extremities. Psychiatric: Normal mood and affect.  Laboratory Data: Lab Results  Component Value Date   WBC 6.5 08/02/2020   HGB 13.6 08/02/2020   HCT 41.1 08/02/2020   MCV 98.3 08/02/2020   PLT 124 (L) 08/02/2020    Lab Results  Component Value Date   CREATININE 1.05 08/02/2020    No results found for: PSA  No results found for: TESTOSTERONE  No results found for: HGBA1C  Urinalysis No results found for: COLORURINE, APPEARANCEUR, LABSPEC, PHURINE, GLUCOSEU, HGBUR, BILIRUBINUR, KETONESUR, PROTEINUR, UROBILINOGEN, NITRITE, LEUKOCYTESUR  No results found for: LABMICR, [place], RBCUA, LABEPIT, MUCUS, BACTERIA  Pertinent Imaging:  No results found for this or any previous visit.  No results found for this or any previous visit.  No results found for this or any previous visit.  No results found for this or any previous visit.  No results found for this or any previous visit.  No results found for this or any previous visit.  No results found for this or any previous visit.  No results found for this or any previous visit.   Assessment & Plan:    1. Weak urinary stream -continue uroxatral 10mg  qhs - Urinalysis, Routine w reflex microscopic  2. Nocturia -continue uroxatral 10mg  qhs PSA today   No follow-ups on file.  Nicolette Bang, MD  [facility]

## 2020-12-28 NOTE — Patient Instructions (Signed)

## 2020-12-28 NOTE — Addendum Note (Signed)
Addended by: Pollyann Kennedy F on: 12/28/2020 10:30 AM   Modules accepted: Orders

## 2020-12-28 NOTE — Progress Notes (Signed)

## 2020-12-29 LAB — PSA: Prostate Specific Ag, Serum: 0.7 ng/mL (ref 0.0–4.0)

## 2021-01-03 DIAGNOSIS — D698 Other specified hemorrhagic conditions: Secondary | ICD-10-CM | POA: Diagnosis not present

## 2021-01-03 NOTE — Progress Notes (Signed)
Results mailed 

## 2021-01-26 ENCOUNTER — Encounter (HOSPITAL_COMMUNITY): Payer: Self-pay | Admitting: Hematology

## 2021-01-26 ENCOUNTER — Inpatient Hospital Stay (HOSPITAL_COMMUNITY): Payer: Medicare Other | Attending: Hematology | Admitting: Hematology

## 2021-01-26 ENCOUNTER — Inpatient Hospital Stay (HOSPITAL_COMMUNITY): Payer: Medicare Other

## 2021-01-26 ENCOUNTER — Other Ambulatory Visit: Payer: Self-pay

## 2021-01-26 VITALS — BP 113/62 | HR 84 | Temp 98.0°F | Resp 18 | Ht 71.0 in | Wt 202.8 lb

## 2021-01-26 DIAGNOSIS — F1011 Alcohol abuse, in remission: Secondary | ICD-10-CM

## 2021-01-26 DIAGNOSIS — Z87891 Personal history of nicotine dependence: Secondary | ICD-10-CM

## 2021-01-26 DIAGNOSIS — D696 Thrombocytopenia, unspecified: Secondary | ICD-10-CM | POA: Diagnosis not present

## 2021-01-26 DIAGNOSIS — Z7982 Long term (current) use of aspirin: Secondary | ICD-10-CM

## 2021-01-26 LAB — HEPATITIS B SURFACE ANTIBODY,QUALITATIVE: Hep B S Ab: NONREACTIVE

## 2021-01-26 LAB — HEPATITIS B SURFACE ANTIGEN: Hepatitis B Surface Ag: NONREACTIVE

## 2021-01-26 LAB — VITAMIN B12: Vitamin B-12: 233 pg/mL (ref 180–914)

## 2021-01-26 LAB — HEPATITIS B CORE ANTIBODY, TOTAL: Hep B Core Total Ab: NONREACTIVE

## 2021-01-26 LAB — FOLATE: Folate: 10 ng/mL (ref >5.9)

## 2021-01-26 NOTE — Progress Notes (Signed)
Jerry Roach [address] [place] [address]   CLINIC:  Medical Oncology/Hematology  CONSULT NOTE  Patient Care Team: Redmond School, MD as PCP - General (Internal Medicine) Harl Bowie Alphonse Guild, MD as PCP - Cardiology (Cardiology)  CHIEF COMPLAINTS/PURPOSE OF CONSULTATION:  Thrombocytopenia  HISTORY OF PRESENTING ILLNESS:  Jerry Roach 75 y.o. male is here at the request of his PCP (Dr. Gerarda Roach) for thrombocytopenia.  Labs sent by his PCP show platelets 126 on 12/08/2020, and platelets 130 on 01/03/2021.  Review of previous lab records in our system show that the patient had chronic mild thrombocytopenia as far back as 2016, ranging from 124 to 148.  Patient was first told that he had low platelets at his most recent visit with Dr. Gerarda Roach.  He takes 81 mg daily aspirin at home due to his CAD.  He reports that if he accidentally cuts himself, he bleeds slightly more than normal.  No bruising.  He denies any mucosal bleeding or petechial rash.  No epistaxis, hematuria, hemoptysis, hematochezia, melena.  He denies any fever, chills, night sweats, malaise.  He reports that he feels "as good as he did at 75 years old."  No fatigue, energy is 100%.  Appetite is 100%.  Weight is stable.  No history of autoimmune disease.  No known history of HIV, hepatitis C, or H. pylori. Patient denies any known history of liver disease or splenomegaly.  Denies use of quinine medication or consumption quinine-containing beverages  His PMH is otherwise significant for NSTEMI and coronary artery disease, s/p PCI on 12/27/2017, aortic stenosis, and BPH  [name] lives at home with his wife, Jerry Roach.  He continues to work on his farm, growing tobacco, wheat, and soybeans.  He reports heavy exposure to pesticides in the 1960s-1970s.  He does have a history of heavy alcohol use while he was in the [employer], but stopped drinking heavily about 50 years ago.  He is a former smoker, but quit cigarettes  3 years ago at the time of his heart attack.   His father had alcoholic liver cirrhosis.  No family history of cancer or blood clots.   MEDICAL HISTORY:  Past Medical History:  Diagnosis Date  . CAD (coronary artery disease)    a. 12/2017: s/p NSTEMI with DES to Proximal LAD. CTO of RCA with L--> R collaterals noted.   . Hypercholesteremia   . Nocturia   . Weak urinary stream     SURGICAL HISTORY: Past Surgical History:  Procedure Laterality Date  . CATARACT EXTRACTION W/PHACO Left 03/03/2015   Procedure: CATARACT EXTRACTION PHACO AND INTRAOCULAR LENS PLACEMENT (IOC);  Surgeon: Baruch Goldmann, MD;  Location: [facility];  Service: Ophthalmology;  Laterality: Left;  CDE 6.86  . CATARACT EXTRACTION W/PHACO Right 08/31/2015   Procedure: CATARACT EXTRACTION PHACO AND INTRAOCULAR LENS PLACEMENT RIGHT EYE CDE=4.08;  Surgeon: Baruch Goldmann, MD;  Location: [facility];  Service: Ophthalmology;  Laterality: Right;  . COLONOSCOPY  11/05/2011   Procedure: COLONOSCOPY;  Surgeon: Daneil Dolin, MD;  Location: [facility];  Service: Endoscopy;  Laterality: N/A;  7:30 AM  . CORONARY STENT INTERVENTION N/A 12/27/2017   Procedure: CORONARY STENT INTERVENTION;  Surgeon: Nelva Bush, MD;  Location: [facility];  Service: Cardiovascular;  Laterality: N/A;  . LEFT HEART CATH AND CORONARY ANGIOGRAPHY N/A 12/27/2017   Procedure: LEFT HEART CATH AND CORONARY ANGIOGRAPHY;  Surgeon: Nelva Bush, MD;  Location: [facility];  Service: Cardiovascular;  Laterality: N/A;  . NO PAST SURGERIES      SOCIAL HISTORY: Social History   Socioeconomic History  . Marital status: Married    Spouse name: Not on file  . Number of children: Not on file  . Years of education: Not on file  . Highest education level: Not on file  Occupational History  . Not on file  Tobacco Use  . Smoking status: Former Smoker    Packs/day: 1.50    Years: 39.00    Pack years: 58.50    Types: Cigarettes    Quit date:  12/26/2017    Years since quitting: 3.1  . Smokeless tobacco: Never Used  Vaping Use  . Vaping Use: Never used  Substance and Sexual Activity  . Alcohol use: No  . Drug use: No  . Sexual activity: Yes    Birth control/protection: None  Other Topics Concern  . Not on file  Social History Narrative  . Not on file   Social Determinants of Health   Financial Resource Strain: Low Risk   . Difficulty of Paying Living Expenses: Not hard at all  Food Insecurity: No Food Insecurity  . Worried About Charity fundraiser in the Last Year: Never true  . Ran Out of Food in the Last Year: Never true  Transportation Needs: No Transportation Needs  . Lack of Transportation (Medical): No  . Lack of Transportation (Non-Medical): No  Physical Activity: Sufficiently Active  . Days of Exercise per Week: 5 days  . Minutes of Exercise per Session: 50 min  Stress: No Stress Concern Present  . Feeling of Stress : Not at all  Social Connections: Socially Integrated  . Frequency of Communication with Friends and Family: More than three times a week  . Frequency of Social Gatherings with Friends and Family: More than three times a week  . Attends Religious Services: 1 to 4 times per year  . Active Member of Clubs or Organizations: No  . Attends Archivist Meetings: 1 to 4 times per year  . Marital Status: Married  Human resources officer Violence: Not At Risk  . Fear of Current or Ex-Partner: No  . Emotionally Abused: No  . Physically Abused: No  . Sexually Abused: No    FAMILY HISTORY: Family History  Problem Relation Age of Onset  . Hypertension Father   . COPD Mother   . Hypertension Sister   . COPD Sister   . COPD Brother   . Colon cancer Neg Hx     ALLERGIES:  has No Known Allergies.  MEDICATIONS:  Current Outpatient Medications  Medication Sig Dispense Refill  . alfuzosin (UROXATRAL) 10 MG 24 hr tablet Take 1 tablet (10 mg total) by mouth daily with breakfast. 90 tablet 3  .  aspirin EC 81 MG EC tablet Take 1 tablet (81 mg total) by mouth daily.    Marland Kitchen atorvastatin (LIPITOR) 80 MG tablet TAKE 1 TABLET(80 MG) BY MOUTH DAILY AT 6 PM 90 tablet 3  . mometasone (NASONEX) 50 MCG/ACT nasal spray 2 sprays daily.    Marland Kitchen acetaminophen (TYLENOL) 325 MG tablet Take 2 tablets (650 mg total) by mouth every 6 (six) hours as needed for mild pain or headache. (Patient not taking: Reported on 01/26/2021)    . nitroGLYCERIN (NITROSTAT) 0.4 MG SL tablet Place 1 tablet (0.4 mg total) under the tongue every 5 (five) minutes x 3 doses as needed for chest pain. (Patient not taking: Reported on 01/26/2021) 25 tablet 3  No current facility-administered medications for this visit.    REVIEW OF SYSTEMS:   Review of Systems  Constitutional: Negative for appetite change, chills, diaphoresis, fatigue, fever and unexpected weight change.  HENT:   Negative for lump/mass and nosebleeds.   Eyes: Negative for eye problems.  Respiratory: Negative for cough, hemoptysis and shortness of breath.   Cardiovascular: Negative for chest pain, leg swelling and palpitations.  Gastrointestinal: Negative for abdominal pain, blood in stool, constipation, diarrhea, nausea and vomiting.  Genitourinary: Negative for hematuria.   Skin: Negative.   Neurological: Negative for dizziness, headaches and light-headedness.  Hematological: Bruises/bleeds easily.      PHYSICAL EXAMINATION: ECOG PERFORMANCE STATUS: 0 - Asymptomatic  Vitals:   01/26/21 1403  BP: 113/62  Pulse: 84  Resp: 18  Temp: 98 F (36.7 C)  SpO2: 98%   Filed Weights   01/26/21 1403  Weight: 202 lb 13.2 oz (92 kg)    Physical Exam Constitutional:      Appearance: Normal appearance. He is obese.  HENT:     Head: Normocephalic and atraumatic.     Mouth/Throat:     Mouth: Mucous membranes are moist.  Eyes:     Extraocular Movements: Extraocular movements intact.     Pupils: Pupils are equal, round, and reactive to light.  Cardiovascular:      Rate and Rhythm: Normal rate and regular rhythm.     Pulses: Normal pulses.     Heart sounds: Murmur (Systolic ejection murmur in the setting of known aortic stenosis) heard.    Pulmonary:     Effort: Pulmonary effort is normal.     Breath sounds: Normal breath sounds.  Abdominal:     General: Bowel sounds are normal.     Palpations: Abdomen is soft.     Tenderness: There is no abdominal tenderness.  Musculoskeletal:        General: No swelling.     Right lower leg: No edema.     Left lower leg: No edema.  Lymphadenopathy:     Cervical: No cervical adenopathy.  Skin:    General: Skin is warm and dry.  Neurological:     General: No focal deficit present.     Mental Status: He is alert and oriented to person, place, and time.  Psychiatric:        Mood and Affect: Mood normal.        Behavior: Behavior normal.       LABORATORY DATA:  I have reviewed the data as listed Recent Results (from the past 2160 hour(s))  Urinalysis, Routine w reflex microscopic     Status: None   Collection Time: 12/28/20 10:28 AM  Result Value Ref Range   Specific Gravity, UA 1.015 1.005 - 1.030   pH, UA 7.0 5.0 - 7.5   Color, UA Yellow Yellow   Appearance Ur Clear Clear   Leukocytes,UA Negative Negative   Protein,UA Negative Negative/Trace   Glucose, UA Negative Negative   Ketones, UA Negative Negative   RBC, UA Negative Negative   Bilirubin, UA Negative Negative   Urobilinogen, Ur 0.2 0.2 - 1.0 mg/dL   Nitrite, UA Negative Negative   Microscopic Examination Comment     Comment: Microscopic follows if indicated.  PSA     Status: None   Collection Time: 12/28/20 11:32 AM  Result Value Ref Range   Prostate Specific Ag, Serum 0.7 0.0 - 4.0 ng/mL    Comment: Roche ECLIA methodology. According to the American Urological Association, Serum  PSA should decrease and remain at undetectable levels after radical prostatectomy. The AUA defines biochemical recurrence as an initial PSA value 0.2  ng/mL or greater followed by a subsequent confirmatory PSA value 0.2 ng/mL or greater. Values obtained with different assay methods or kits cannot be used interchangeably. Results cannot be interpreted as absolute evidence of the presence or absence of malignant disease.   Pathologist smear review     Status: None   Collection Time: 01/26/21  3:19 PM  Result Value Ref Range   Path Review Reviewed By Violet Baldy, M.D.     Comment: 01/27/2021 THROMBOCYTOPENIA Performed at [facility], [place] [address], [place], [place] [address]   H Pylori, IGM, IGG, IGA AB     Status: Abnormal   Collection Time: 01/26/21  3:19 PM  Result Value Ref Range   H. Pylogi, Iga Abs 20.5 (H) 0.0 - 8.9 units    Comment: (NOTE)                                Negative          <9.0                                Equivocal   9.0 - 11.0                                Positive         >11.0    H. Pylogi, Igm Abs <9.0 0.0 - 8.9 units    Comment: (NOTE)                                Negative          <9.0                                Equivocal   9.0 - 11.0                                Positive         >11.0 This test was developed and its performance characteristics determined by [facility]. It has not been cleared or approved by the Food and Drug Administration. Performed At: [facility] [place], [place] [id number] Rush Farmer MD [id number]    H Pylori IgG 5.93 (H) 0.00 - 0.79 Index Value    Comment: (NOTE)                             Negative           <0.80                             Equivocal    0.80 - 0.89                             Positive           >0.89   Vitamin B12     Status:  None   Collection Time: 01/26/21  3:19 PM  Result Value Ref Range   Vitamin B-12 233 180 - 914 pg/mL    Comment: (NOTE) This assay is not validated for testing neonatal or myeloproliferative syndrome specimens for Vitamin B12 levels. Performed at [facility],  [address], [place], [place] [address]   Methylmalonic acid, serum     Status: None   Collection Time: 01/26/21  3:19 PM  Result Value Ref Range   Methylmalonic Acid, Quantitative 220 0 - 378 nmol/L    Comment: (NOTE) This test was developed and its performance characteristics determined by [facility]. It has not been cleared or approved by the Food and Drug Administration. Performed At: [facility] [place], [place] [id number] Rush Farmer MD [id number]   Folate     Status: None   Collection Time: 01/26/21  3:19 PM  Result Value Ref Range   Folate 10.0 >5.9 ng/mL    Comment: Performed at [facility], [address] [place], [place] [address]  Copper, serum     Status: None   Collection Time: 01/26/21  3:19 PM  Result Value Ref Range   Copper 98 69 - 132 ug/dL    Comment: (NOTE) This test was developed and its performance characteristics determined by [facility]. It has not been cleared or approved by the Food and Drug Administration.                                Detection Limit = 5 Performed At: [facility] [place], [place] [id number] Rush Farmer MD [id number]   ANA     Status: None   Collection Time: 01/26/21  3:19 PM  Result Value Ref Range   Anti Nuclear Antibody (ANA) Negative Negative    Comment: (NOTE) Performed At: [facility] [place], [place] [id number] Rush Farmer MD [id number]   Rheumatoid factor     Status: None   Collection Time: 01/26/21  3:19 PM  Result Value Ref Range   Rhuematoid fact SerPl-aCnc <10.0 <14.0 IU/mL    Comment: (NOTE) Performed At: [facility] [place], [place] [id number] Rush Farmer MD [id number]   Hepatitis B surface antibody     Status: None   Collection Time: 01/26/21  3:19 PM  Result Value Ref Range   Hep B S Ab NON REACTIVE NON REACTIVE    Comment: (NOTE) Inconsistent with immunity, less than 10 mIU/mL.  Performed  at [facility], [place] [address], [place], [place] [address]   Hepatitis B surface antigen     Status: None   Collection Time: 01/26/21  3:19 PM  Result Value Ref Range   Hepatitis B Surface Ag NON REACTIVE NON REACTIVE    Comment: Performed at [facility] [address], [place], [place] [address]  Hepatitis B core antibody, total     Status: None   Collection Time: 01/26/21  3:19 PM  Result Value Ref Range   Hep B Core Total Ab NON REACTIVE NON REACTIVE    Comment: Performed at [facility] [address], [place], [place] [address]  HCV Ab w Reflex to Quant PCR     Status: None   Collection Time: 01/26/21  3:19 PM  Result Value Ref Range   HCV Ab <0.1 0.0 - 0.9 s/co ratio    Comment: (NOTE) Performed At: [facility]  [place], [place] [id number] Rush Farmer MD OF:[id number]   Interpretation:     Status: None   Collection Time: 01/26/21  3:19 PM  Result Value Ref Range   HCV Interp 1: Comment     Comment: (NOTE) Negative Not infected with HCV, unless recent infection is suspected or other evidence exists to indicate HCV infection. Performed At: [facility] [place], [place] [id number] Rush Farmer MD [id number]   CBC with Differential/Platelet     Status: Abnormal   Collection Time: 01/26/21  3:19 PM  Result Value Ref Range   WBC 6.4 4.0 - 10.5 K/uL   RBC 4.21 (L) 4.22 - 5.81 MIL/uL   Hemoglobin 13.6 13.0 - 17.0 g/dL   HCT 41.5 39.0 - 52.0 %   MCV 98.6 80.0 - 100.0 fL   MCH 32.3 26.0 - 34.0 pg   MCHC 32.8 30.0 - 36.0 g/dL   RDW 13.2 11.5 - 15.5 %   Platelets 133 (L) 150 - 400 K/uL   nRBC 0.0 0.0 - 0.2 %   Neutrophils Relative % 64 %   Neutro Abs 4.1 1.7 - 7.7 K/uL   Lymphocytes Relative 23 %   Lymphs Abs 1.5 0.7 - 4.0 K/uL   Monocytes Relative 9 %   Monocytes Absolute 0.5 0.1 - 1.0 K/uL   Eosinophils Relative 3 %   Eosinophils Absolute 0.2 0.0 - 0.5 K/uL   Basophils Relative 1 %    Basophils Absolute 0.1 0.0 - 0.1 K/uL   Immature Granulocytes 0 %   Abs Immature Granulocytes 0.01 0.00 - 0.07 K/uL    Comment: Performed at [facility], [place] [address], [place], [place] [address]    RADIOGRAPHIC STUDIES: I have personally reviewed the radiological images as listed and agreed with the findings in the report. No results found.  ASSESSMENT: 1.  Mild thrombocytopenia of undetermined etiology - Chronic mild thrombocytopenia since 2016 - Most recent CBC showed platelets 130 (01/03/2021) - History of pesticide exposure, remote history of alcohol abuse - Differential diagnosis includes early MDS, splenic sequestration, and possible ITP - He denies any bleeding events  2.  Other history - PMH: NSTEMI and coronary artery disease, s/p PCI on 12/27/2017, aortic stenosis, and BPH - Social: Lives at home with wife.  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 - Remote history of heavy alcohol use while he was in the [employer], but stopped drinking heavily about 50 years ago.   - He is a former smoker, but quit cigarettes 3 years ago at the time of his heart attack.  - His father had alcoholic liver cirrhosis.  No family history of cancer or blood clots.  PLAN:  1.  Mild thrombocytopenia of undetermined etiology - Continue aspirin for CAD - no significant risk of bleeding with mild thrombocytopenia - Repeat CBC with peripheral smear - Check nutrient deficiencies (B12, folate, copper, methylmalonic acid) - Check hepatitis B/C, H. pylori - Check rheumatoid factor and ANA - RTC in 2-3 weeks to discuss results   PLAN SUMMARY & DISPOSITION: - Labs today - RTC 2 to 3 weeks to discuss results  All questions were answered. The patient knows to call the clinic with any problems, questions or concerns.  Medical decision making: Low  Time spent on visit: I spent 20 minutes counseling the patient face to face. The total time spent in the appointment  was 30 minutes and more than 50% was on counseling.  I, Tarri Abernethy PA-C, have seen this patient in conjunction with Dr. Derek Jack. Greater than 50% of visit was performed by Dr. Delton Coombes.  Addendum: I have independently evaluated this patient and agree with HPI written by Casey Burkitt, PA-C.  Patient evaluated for mild thrombocytopenia at the request of Dr. Gerarda Roach.  No bleeding issues.  No palpable spleen on clinical exam.  We will repeat CBC today and check for nutritional deficiencies, connective tissue disorders and infectious etiologies.  Differential diagnosis includes early MDS versus immune thrombocytopenia.  We will talk to him in 2 to 3 weeks for follow-up.   Derek Jack, MD 02/04/21 10:34 AM

## 2021-01-27 LAB — RHEUMATOID FACTOR: Rheumatoid fact SerPl-aCnc: <10 IU/mL (ref <14.0)

## 2021-01-27 LAB — CBC WITH DIFFERENTIAL/PLATELET
Abs Immature Granulocytes: 0.01 10*3/uL (ref 0.00–0.07)
Basophils Absolute: 0.1 10*3/uL (ref 0.0–0.1)
Basophils Relative: 1 %
Eosinophils Absolute: 0.2 10*3/uL (ref 0.0–0.5)
Eosinophils Relative: 3 %
HCT: 41.5 % (ref 39.0–52.0)
Hemoglobin: 13.6 g/dL (ref 13.0–17.0)
Immature Granulocytes: 0 %
Lymphocytes Relative: 23 %
Lymphs Abs: 1.5 10*3/uL (ref 0.7–4.0)
MCH: 32.3 pg (ref 26.0–34.0)
MCHC: 32.8 g/dL (ref 30.0–36.0)
MCV: 98.6 fL (ref 80.0–100.0)
Monocytes Absolute: 0.5 10*3/uL (ref 0.1–1.0)
Monocytes Relative: 9 %
Neutro Abs: 4.1 10*3/uL (ref 1.7–7.7)
Neutrophils Relative %: 64 %
Platelets: 133 10*3/uL — ABNORMAL LOW (ref 150–400)
RBC: 4.21 MIL/uL — ABNORMAL LOW (ref 4.22–5.81)
RDW: 13.2 % (ref 11.5–15.5)
WBC: 6.4 10*3/uL (ref 4.0–10.5)
nRBC: 0 % (ref 0.0–0.2)

## 2021-01-27 LAB — ANA: Anti Nuclear Antibody (ANA): NEGATIVE

## 2021-01-27 LAB — PATHOLOGIST SMEAR REVIEW

## 2021-01-27 LAB — HCV AB W REFLEX TO QUANT PCR: HCV Ab: <0.1 s/co ratio (ref 0.0–0.9)

## 2021-01-27 LAB — HCV INTERPRETATION

## 2021-01-29 LAB — H PYLORI, IGM, IGG, IGA AB
H Pylori IgG: 5.93 Index Value — ABNORMAL HIGH (ref 0.00–0.79)
H. Pylogi, Iga Abs: 20.5 units — ABNORMAL HIGH (ref 0.0–8.9)
H. Pylogi, Igm Abs: <9 units (ref 0.0–8.9)

## 2021-01-30 LAB — COPPER, SERUM: Copper: 98 ug/dL (ref 69–132)

## 2021-02-02 LAB — METHYLMALONIC ACID, SERUM: Methylmalonic Acid, Quantitative: 220 nmol/L (ref 0–378)

## 2021-02-06 ENCOUNTER — Encounter: Payer: Self-pay | Admitting: Cardiology

## 2021-02-06 ENCOUNTER — Other Ambulatory Visit: Payer: Self-pay

## 2021-02-06 ENCOUNTER — Ambulatory Visit (INDEPENDENT_AMBULATORY_CARE_PROVIDER_SITE_OTHER): Payer: Medicare Other | Admitting: Cardiology

## 2021-02-06 VITALS — BP 130/68 | HR 78 | Ht 71.0 in | Wt 206.0 lb

## 2021-02-06 DIAGNOSIS — E782 Mixed hyperlipidemia: Secondary | ICD-10-CM | POA: Diagnosis not present

## 2021-02-06 DIAGNOSIS — I350 Nonrheumatic aortic (valve) stenosis: Secondary | ICD-10-CM

## 2021-02-06 DIAGNOSIS — I2510 Atherosclerotic heart disease of native coronary artery without angina pectoris: Secondary | ICD-10-CM | POA: Diagnosis not present

## 2021-02-06 NOTE — Progress Notes (Signed)
Clinical Summary Jerry Roach is a 75 y.o.male seen today for follow up of the following medical problems.  1. CAD - admit 12/2017 with chest pain, ruled in for NSTEMI - received DES to LAD. Residual RCA CTO and moderate LCX disease managed medically.  - 12/2017 echo LVEF 55-60%, no WMAs, grade II diastolic dysfunction. modeare AS - has not been on beta blocker due to bradycardia.Has had some prior renal insufficencyhas not been on ACE-I.   -- no chest pain. No SOB or DOE - compliant withmeds  2. Aortic stenosis - 12/2017 moderate AS by echo: mean grad 28, AVA VTI 0.81, dimensionless index 0.24.   03/2019 echo LVEF 55-60%, mod to severe AS (mean grad 23, AVA VTI 0.94, dimensionless index 0.28)  04/2020 echo LVEF 60-65%, mod to severe AS (mean grad 28, AVA VTI 0.8 DI 0.26 SVT 41   3. Hyperlipidemia - 12/2017 TC 133 TG 55 HDL 45 LDL 77 - 07/2020 TC 113 TG 56 HDL 43 LDL 59 - he is compliant with statin  4. Carotid stenosis - 03/2019 mild bilateral disease  5. Mild thrombocytopenia - followed by heme/onc, from last note no current contraindication to aspirin.    Works on tobacco farm   Past Medical History:  Diagnosis Date  . CAD (coronary artery disease)    a. 12/2017: s/p NSTEMI with DES to Proximal LAD. CTO of RCA with L--> R collaterals noted.   . Hypercholesteremia   . Nocturia   . Weak urinary stream      No Known Allergies   Current Outpatient Medications  Medication Sig Dispense Refill  . acetaminophen (TYLENOL) 325 MG tablet Take 2 tablets (650 mg total) by mouth every 6 (six) hours as needed for mild pain or headache. (Patient not taking: Reported on 01/26/2021)    . alfuzosin (UROXATRAL) 10 MG 24 hr tablet Take 1 tablet (10 mg total) by mouth daily with breakfast. 90 tablet 3  . aspirin EC 81 MG EC tablet Take 1 tablet (81 mg total) by mouth daily.    Marland Kitchen atorvastatin (LIPITOR) 80 MG tablet TAKE 1 TABLET(80 MG) BY MOUTH DAILY AT 6 PM 90 tablet 3  .  mometasone (NASONEX) 50 MCG/ACT nasal spray 2 sprays daily.    . nitroGLYCERIN (NITROSTAT) 0.4 MG SL tablet Place 1 tablet (0.4 mg total) under the tongue every 5 (five) minutes x 3 doses as needed for chest pain. (Patient not taking: Reported on 01/26/2021) 25 tablet 3   No current facility-administered medications for this visit.     Past Surgical History:  Procedure Laterality Date  . CATARACT EXTRACTION W/PHACO Left 03/03/2015   Procedure: CATARACT EXTRACTION PHACO AND INTRAOCULAR LENS PLACEMENT (IOC);  Surgeon: Baruch Goldmann, MD;  Location: [facility];  Service: Ophthalmology;  Laterality: Left;  CDE 6.86  . CATARACT EXTRACTION W/PHACO Right 08/31/2015   Procedure: CATARACT EXTRACTION PHACO AND INTRAOCULAR LENS PLACEMENT RIGHT EYE CDE=4.08;  Surgeon: Baruch Goldmann, MD;  Location: [facility];  Service: Ophthalmology;  Laterality: Right;  . COLONOSCOPY  11/05/2011   Procedure: COLONOSCOPY;  Surgeon: Daneil Dolin, MD;  [facility]: [facility];  Service: Endoscopy;  Laterality: N/A;  7:30 AM  . CORONARY STENT INTERVENTION N/A 12/27/2017   Procedure: CORONARY STENT INTERVENTION;  Surgeon: Nelva Bush, MD;  Location: [facility] [facility];  Service: Cardiovascular;  Laterality: N/A;  . LEFT HEART CATH AND CORONARY ANGIOGRAPHY N/A 12/27/2017   Procedure: LEFT HEART CATH AND CORONARY ANGIOGRAPHY;  Surgeon: Nelva Bush,  MD;  Location: [place] [facility];  Service: Cardiovascular;  Laterality: N/A;  . NO PAST SURGERIES       No Known Allergies    Family History  Problem Relation Age of Onset  . Hypertension Father   . COPD Mother   . Hypertension Sister   . COPD Sister   . COPD Brother   . Colon cancer Neg Hx      Social History Mr. Batalla reports that he quit smoking about 3 years ago. His smoking use included cigarettes. He has a 58.50 pack-year smoking history. He has never used smokeless tobacco. Mr. Massie reports no history of alcohol use.   Review of Systems CONSTITUTIONAL: No  weight loss, fever, chills, weakness or fatigue.  HEENT: Eyes: No visual loss, blurred vision, double vision or yellow sclerae.No hearing loss, sneezing, congestion, runny nose or sore throat.  SKIN: No rash or itching.  CARDIOVASCULAR: per hpi RESPIRATORY: No shortness of breath, cough or sputum.  GASTROINTESTINAL: No anorexia, nausea, vomiting or diarrhea. No abdominal pain or blood.  GENITOURINARY: No burning on urination, no polyuria NEUROLOGICAL: No headache, dizziness, syncope, paralysis, ataxia, numbness or tingling in the extremities. No change in bowel or bladder control.  MUSCULOSKELETAL: No muscle, back pain, joint pain or stiffness.  LYMPHATICS: No enlarged nodes. No history of splenectomy.  PSYCHIATRIC: No history of depression or anxiety.  ENDOCRINOLOGIC: No reports of sweating, cold or heat intolerance. No polyuria or polydipsia.  Marland Kitchen   Physical Examination Today's Vitals   02/06/21 1339  BP: 130/68  Pulse: 78  SpO2: 97%  Weight: 206 lb (93.4 kg)  Height: 5\' 11"  (1.803 m)   Body mass index is 28.73 kg/m.; Gen: resting comfortably, no acute distress HEENT: no scleral icterus, pupils equal round and reactive, no palptable cervical adenopathy,  CV: RRR, 3/6 systolic murmur rusb, no jvd Resp: Clear to auscultation bilaterally GI: abdomen is soft, non-tender, non-distended, normal bowel sounds, no hepatosplenomegaly MSK: extremities are warm, no edema.  Skin: warm, no rash Neuro:  no focal deficits Psych: appropriate affect   Diagnostic Studies 12/2017 echo Study Conclusions  - Left ventricle: The cavity size was normal. Systolic function was normal. The estimated ejection fraction was in the range of 55% to 60%. Wall motion was normal; there were no regional wall motion abnormalities. Features are consistent with a pseudonormal left ventricular filling pattern, with concomitant abnormal relaxation and increased filling pressure (grade 2  diastolic dysfunction). - Aortic valve: Trileaflet; moderately thickened, moderately calcified leaflets. Valve mobility was restricted. There was moderate stenosis. There was mild regurgitation. Peak velocity (S): 339 cm/s. Mean gradient (S): 28 mm Hg. - Mitral valve: Calcified annulus. - Left atrium: The atrium was moderately dilated. - Right ventricle: The cavity size was mildly dilated. Wall thickness was normal. - Right atrium: The atrium was mildly dilated.  12/2017 cath Conclusions: 1. Severe 2-vessel coronary artery disease, including sequential 80% and 40% proximal and mid LAD stenoses, as well as 50% ostial and 100% distal RCA lesions. 2. Moderate disease involving LCx and dominant OM Ayo Guarino. 3. Collateralization of distal RCA branches and appearance of thrombus in the distal RCA suggests subacute thrombosis. 4. Mildly elevated left ventricular filling pressure. 5. Moderately elevated LVOT gradient, likely due to aortic stenosis. 6. Basal and mid inferior hypokinesis with otherwise preserved left ventricular contraction. 7. Successful PCI to the proximal LAD using Resolute Onyx 3.0 x 18 mm drug-eluting stent with 0% residual stenosis and TIMI-3 flow.  Post cathRecommendations: 1. Medical  therapy for moderate LCx disease and occluded distal RCA with left-to-right collaterals. 2. Dual antiplatelet therapy with aspirin and ticagrelor for at least 12 months. 3. Aggressive secondary prevention. 4. Obtain echocardiogram for further evaluation of suspect aortic stenosis.  12/2017 carotid US Bilateral 1-39% ICA disease   04/2020 echo IMPRESSIONS    1. Left ventricular ejection fraction, by estimation, is 60 to 65%. The  left ventricle has normal function. The left ventricle demonstrates  regional wall motion abnormalities (see scoring diagram/findings for  description). There is mild left ventricular  hypertrophy. Left ventricular diastolic parameters are  consistent with  Grade I diastolic dysfunction (impaired relaxation).  2. Right ventricular systolic function is normal. The right ventricular  size is normal. Tricuspid regurgitation signal is inadequate for assessing  PA pressure.  3. Left atrial size was upper normal.  4. The mitral valve is abnormal, mildly calcified with moderate annular  calcification. Trivial mitral valve regurgitation.  5. The aortic valve has an indeterminant number of cusps, possibly  functionally bicuspid. Aortic valve regurgitation is mild. Moderate to  severe aortic valve stenosis (upper end of scale). Aortic valve mean  gradient measures 28.3 mmHg. Aortic valve Vmax  measures 3.42 m/s. Dimentionless index 0.26.  6. The inferior vena cava is normal in size with greater than 50%  respiratory variability, suggesting right atrial pressure of 3 mmHg.    Assessment and Plan   1. CAD -  Medical therapy limited due to bradycardia and prior renal insufficiency previously though has improved - no recent symptoms   2. Aortic stenosis - moderate to severe AS that is asymptomatic, more favoring the severe end of the spectrum - repeat echo  3. Hyperlipidemia -at goal, continue statin  F/u 83months     Arnoldo Lenis, M.D.

## 2021-02-06 NOTE — Patient Instructions (Signed)
Medication Instructions:  Your physician recommends that you continue on your current medications as directed. Please refer to the Current Medication list given to you today.  *If you need a refill on your cardiac medications before your next appointment, please call your pharmacy*   Lab Work: None If you have labs (blood work) drawn today and your tests are completely normal, you will receive your results only by: Marland Kitchen MyChart Message (if you have MyChart) OR . A paper copy in the mail If you have any lab test that is abnormal or we need to change your treatment, we will call you to review the results.   Testing/Procedures: Your physician has requested that you have an echocardiogram. Echocardiography is a painless test that uses sound waves to create images of your heart. It provides your doctor with information about the size and shape of your heart and how well your heart's chambers and valves are working. This procedure takes approximately one hour. There are no restrictions for this procedure.     Follow-Up: At [facility], you and your health needs are our priority.  As part of our continuing mission to provide you with exceptional heart care, we have created designated Provider Care Teams.  These Care Teams include your primary Cardiologist (physician) and Advanced Practice Providers (APPs -  Physician Assistants and Nurse Practitioners) who all work together to provide you with the care you need, when you need it.  We recommend signing up for the patient portal called "MyChart".  Sign up information is provided on this After Visit Summary.  MyChart is used to connect with patients for Virtual Visits (Telemedicine).  Patients are able to view lab/test results, encounter notes, upcoming appointments, etc.  Non-urgent messages can be sent to your provider as well.   To learn more about what you can do with MyChart, go to [email or web address].    Your next appointment:   6  month(s)  The format for your next appointment:   In Person  Provider:   You may see Carlyle Dolly, MD or one of the following Advanced Practice Providers on your designated Care Team:    Bernerd Pho, [place]     Other Instructions

## 2021-02-06 NOTE — Addendum Note (Signed)
Addended by: Christella Scheuermann C on: 02/06/2021 02:12 PM   Modules accepted: Orders

## 2021-02-10 ENCOUNTER — Inpatient Hospital Stay (HOSPITAL_COMMUNITY): Payer: Medicare Other | Attending: Hematology | Admitting: Physician Assistant

## 2021-02-10 ENCOUNTER — Other Ambulatory Visit: Payer: Self-pay

## 2021-02-10 DIAGNOSIS — Z7982 Long term (current) use of aspirin: Secondary | ICD-10-CM | POA: Insufficient documentation

## 2021-02-10 DIAGNOSIS — D696 Thrombocytopenia, unspecified: Secondary | ICD-10-CM | POA: Diagnosis not present

## 2021-02-10 DIAGNOSIS — I2510 Atherosclerotic heart disease of native coronary artery without angina pectoris: Secondary | ICD-10-CM | POA: Insufficient documentation

## 2021-02-10 DIAGNOSIS — Z87891 Personal history of nicotine dependence: Secondary | ICD-10-CM | POA: Diagnosis not present

## 2021-02-10 NOTE — Progress Notes (Signed)
[address] [place], [place] [address]   CLINIC:  Medical Oncology/Hematology  PCP:  [address], [place] [address] [phone]   REASON FOR VISIT:  Follow-up for thrombocytopenia  CURRENT THERAPY: Observation  INTERVAL HISTORY:  [name] 75 y.o. male returns for routine follow-up of thrombocytopenia.  He was seen for initial consultation by Tarri Abernethy PA-C and Dr. Delton Coombes on 01/26/2021, at the request of his PCP Dr. Gerarda Fraction.  He has had chronic mild thrombocytopenia as far back as 2016.  He returns today to discuss results of work-up for thrombocytopenia that was initiated at last visit.  Since his last visit, he reports that he has been doing well.  He continues to take 81 mg daily aspirin at home due to his CAD.   He denies any significant bruising.  He denies any mucosal bleeding or petechial rash.  No epistaxis, hematuria, hemoptysis, hematochezia, melena.  No fatigue, fever, chills, shortness of breath, cough, chest pain, nausea, vomiting, abdominal pain.  Denies any signs or symptoms of blood loss.  No current signs or symptoms of blood clots.  He has 100% energy and 100% appetite. He endorses that he is maintaining a stable weight.    REVIEW OF SYSTEMS:  Review of Systems  Constitutional: Negative for appetite change, chills, diaphoresis, fatigue, fever and unexpected weight change.  HENT:   Negative for lump/mass and nosebleeds.        Nasal congestion w/ seasonal allergies  Eyes: Negative for eye problems.  Respiratory: Negative for cough, hemoptysis and shortness of breath.   Cardiovascular: Negative for chest pain, leg swelling and palpitations.  Gastrointestinal: Negative for abdominal pain, blood in stool, constipation, diarrhea, nausea and vomiting.  Genitourinary: Negative for hematuria.   Skin: Negative.   Neurological: Negative for dizziness, headaches and light-headedness.   Hematological: Bruises/bleeds easily.      PAST MEDICAL/SURGICAL HISTORY:  Past Medical History:  Diagnosis Date  . CAD (coronary artery disease)    a. 12/2017: s/p NSTEMI with DES to Proximal LAD. CTO of RCA with L--> R collaterals noted.   . Hypercholesteremia   . Nocturia   . Weak urinary stream    Past Surgical History:  Procedure Laterality Date  . CATARACT EXTRACTION W/PHACO Left 03/03/2015   Procedure: CATARACT EXTRACTION PHACO AND INTRAOCULAR LENS PLACEMENT (IOC);  Surgeon: Baruch Goldmann, MD;  Location: [facility];  Service: Ophthalmology;  Laterality: Left;  CDE 6.86  . CATARACT EXTRACTION W/PHACO Right 08/31/2015   Procedure: CATARACT EXTRACTION PHACO AND INTRAOCULAR LENS PLACEMENT RIGHT EYE CDE=4.08;  Surgeon: Baruch Goldmann, MD;  Location: [facility];  Service: Ophthalmology;  Laterality: Right;  . COLONOSCOPY  11/05/2011   Procedure: COLONOSCOPY;  Surgeon: Daneil Dolin, MD;  [facility];  Service: Endoscopy;  Laterality: N/A;  7:30 AM  . CORONARY STENT INTERVENTION N/A 12/27/2017   Procedure: CORONARY STENT INTERVENTION;  Surgeon: Nelva Bush, MD;  Location: [place] [facility];  Service: Cardiovascular;  Laterality: N/A;  . LEFT HEART CATH AND CORONARY ANGIOGRAPHY N/A 12/27/2017   Procedure: LEFT HEART CATH AND CORONARY ANGIOGRAPHY;  Surgeon: Nelva Bush, MD;  Location: [facility];  Service: Cardiovascular;  Laterality: N/A;  . NO PAST SURGERIES       SOCIAL HISTORY:  Social History   Socioeconomic History  . Marital status: Married    Spouse name: Not on file  . Number of children: Not on file  . Years of education: Not  on file  . Highest education level: Not on file  Occupational History  . Not on file  Tobacco Use  . Smoking status: Former Smoker    Packs/day: 1.50    Years: 39.00    Pack years: 58.50    Types: Cigarettes    Quit date: 12/26/2017    Years since quitting: 3.1  . Smokeless tobacco: Never Used  Vaping Use  . Vaping  Use: Never used  Substance and Sexual Activity  . Alcohol use: No  . Drug use: No  . Sexual activity: Yes    Birth control/protection: None  Other Topics Concern  . Not on file  Social History Narrative  . Not on file   Social Determinants of Health   Financial Resource Strain: Low Risk   . Difficulty of Paying Living Expenses: Not hard at all  Food Insecurity: No Food Insecurity  . Worried About Charity fundraiser in the Last Year: Never true  . Ran Out of Food in the Last Year: Never true  Transportation Needs: No Transportation Needs  . Lack of Transportation (Medical): No  . Lack of Transportation (Non-Medical): No  Physical Activity: Sufficiently Active  . Days of Exercise per Week: 5 days  . Minutes of Exercise per Session: 50 min  Stress: No Stress Concern Present  . Feeling of Stress : Not at all  Social Connections: Socially Integrated  . Frequency of Communication with Friends and Family: More than three times a week  . Frequency of Social Gatherings with Friends and Family: More than three times a week  . Attends Religious Services: 1 to 4 times per year  . Active Member of Clubs or Organizations: No  . Attends Archivist Meetings: 1 to 4 times per year  . Marital Status: Married  Human resources officer Violence: Not At Risk  . Fear of Current or Ex-Partner: No  . Emotionally Abused: No  . Physically Abused: No  . Sexually Abused: No    FAMILY HISTORY:  Family History  Problem Relation Age of Onset  . Hypertension Father   . COPD Mother   . Hypertension Sister   . COPD Sister   . COPD Brother   . Colon cancer Neg Hx     CURRENT MEDICATIONS:  Outpatient Encounter Medications as of 02/10/2021  Medication Sig  . acetaminophen (TYLENOL) 325 MG tablet Take 2 tablets (650 mg total) by mouth every 6 (six) hours as needed for mild pain or headache.  . alfuzosin (UROXATRAL) 10 MG 24 hr tablet Take 1 tablet (10 mg total) by mouth daily with breakfast.  .  aspirin EC 81 MG EC tablet Take 1 tablet (81 mg total) by mouth daily.  Marland Kitchen atorvastatin (LIPITOR) 80 MG tablet TAKE 1 TABLET(80 MG) BY MOUTH DAILY AT 6 PM  . mometasone (NASONEX) 50 MCG/ACT nasal spray 2 sprays daily.  . nitroGLYCERIN (NITROSTAT) 0.4 MG SL tablet Place 1 tablet (0.4 mg total) under the tongue every 5 (five) minutes x 3 doses as needed for chest pain.   No facility-administered encounter medications on file as of 02/10/2021.    ALLERGIES:  No Known Allergies   PHYSICAL EXAM:  ECOG PERFORMANCE STATUS: 0 - Asymptomatic  There were no vitals filed for this visit. There were no vitals filed for this visit. Physical Exam Constitutional:      Appearance: Normal appearance. He is obese.  HENT:     Head: Normocephalic and atraumatic.     Mouth/Throat:  Mouth: Mucous membranes are moist.  Eyes:     Extraocular Movements: Extraocular movements intact.     Pupils: Pupils are equal, round, and reactive to light.  Cardiovascular:     Rate and Rhythm: Normal rate and regular rhythm.     Pulses: Normal pulses.     Heart sounds: Murmur (systolic ejection murmur in the setting of known aortic stenosis) heard.    Pulmonary:     Effort: Pulmonary effort is normal.     Breath sounds: Normal breath sounds.  Abdominal:     General: Bowel sounds are normal.     Palpations: Abdomen is soft.     Tenderness: There is no abdominal tenderness.  Musculoskeletal:        General: No swelling.     Right lower leg: No edema.     Left lower leg: No edema.  Lymphadenopathy:     Cervical: No cervical adenopathy.  Skin:    General: Skin is warm and dry.  Neurological:     General: No focal deficit present.     Mental Status: He is alert and oriented to person, place, and time.  Psychiatric:        Mood and Affect: Mood normal.        Behavior: Behavior normal.      LABORATORY DATA:  I have reviewed the labs as listed.  CBC    Component Value Date/Time   WBC 6.4 01/26/2021  1519   RBC 4.21 (L) 01/26/2021 1519   HGB 13.6 01/26/2021 1519   HCT 41.5 01/26/2021 1519   PLT 133 (L) 01/26/2021 1519   MCV 98.6 01/26/2021 1519   MCH 32.3 01/26/2021 1519   MCHC 32.8 01/26/2021 1519   RDW 13.2 01/26/2021 1519   LYMPHSABS 1.5 01/26/2021 1519   MONOABS 0.5 01/26/2021 1519   EOSABS 0.2 01/26/2021 1519   BASOSABS 0.1 01/26/2021 1519   CMP Latest Ref Rng & Units 08/02/2020 08/31/2019 12/28/2017  Glucose 70 - 99 mg/dL 103(H) - 114(H)  BUN 8 - 23 mg/dL 15 - 14  Creatinine 0.61 - 1.24 mg/dL 1.05 1.20 1.19  Sodium 135 - 145 mmol/L 139 - 135  Potassium 3.5 - 5.1 mmol/L 3.9 - 3.9  Chloride 98 - 111 mmol/L 104 - 102  CO2 22 - 32 mmol/L 26 - 24  Calcium 8.9 - 10.3 mg/dL 9.5 - 8.7(L)  Total Protein 6.5 - 8.1 g/dL 6.6 - -  Total Bilirubin 0.3 - 1.2 mg/dL 0.8 - -  Alkaline Phos 38 - 126 U/L 52 - -  AST 15 - 41 U/L 21 - -  ALT 0 - 44 U/L 17 - -    DIAGNOSTIC IMAGING:  I have independently reviewed the relevant imaging and discussed with the patient.  ASSESSMENT: 1.  Mild thrombocytopenia - Chronic mild thrombocytopenia since 2016 - He denies any bleeding events - Most recent CBC (01/26/2021) shows platelets 133 - Nutritional work-up unremarkable - normal 12, methylmalonic acid, folate, copper - Hepatitis B and hepatitis C negative, rheumatoid factor and ANA negative - H. pylori positive - Differential diagnosis favors mild ITP in the setting of positive H. pylori, but cannot exclude early MDS or splenic sequestration at this time  2.  Other history - PMH: NSTEMI and coronary artery disease, s/p PCI on 12/27/2017, aortic stenosis, and BPH - Social: Lives at home with wife.  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 - Remote history of heavy alcohol use while he was  in the [employer], but stopped drinking heavily about50 years ago.  - He is a former smoker, but quit cigarettes 3 years ago at the time of his heart attack.  - His father had alcoholic  liver cirrhosis. No family history of cancer or blood clots.   PLAN:  1.  Mild thrombocytopenia - Continue aspirin for CAD - no significant risk of bleeding with mild thrombocytopenia - Differential diagnosis favors mild ITP in the setting of positive H. pylori, but cannot exclude early MDS or splenic sequestration at this time - Repeat labs and RTC in 6 months   PLAN SUMMARY & DISPOSITION: -Repeat labs and RTC in 6 months   All questions were answered. The patient knows to call the clinic with any problems, questions or concerns.  Medical decision making: Low  Time spent on visit: I spent 15 minutes counseling the patient face to face. The total time spent in the appointment was 20 minutes and more than 50% was on counseling.   Harriett Rush, PA-C  02/10/21 10:04 AM

## 2021-02-10 NOTE — Patient Instructions (Signed)
[place] at [facility] Discharge Instructions  You were seen today by Tarri Abernethy PA-C for your mild thrombocytopenia (low platelets).  This may be due to a mild immune process that is causing platelet destruction, but no further treatment is needed at this time.  We will continue to observe your blood counts every 6 to 12 months.  LABS: Return in 6 months for repeat labs  OTHER TESTS: None  MEDICATIONS: No changes - continue to take aspirin for your heart disease  FOLLOW-UP APPOINTMENT: - Follow-up appointment in 6 months  - Make an appointment with your primary care provider for any other new or ongoing health concerns.   Thank you for choosing [place] at [facility] to provide your oncology and hematology care.  To afford each patient quality time with our provider, please arrive at least 15 minutes before your scheduled appointment time.   If you have a lab appointment with the [facility] please come in thru the Main Entrance and check in at the main information desk.  You need to re-schedule your appointment should you arrive 10 or more minutes late.  We strive to give you quality time with our providers, and arriving late affects you and other patients whose appointments are after yours.  Also, if you no show three or more times for appointments you may be dismissed from the clinic at the providers discretion.     Again, thank you for choosing [facility]  Our hope is that these requests will decrease the amount of time that you wait before being seen by our physicians.       _____________________________________________________________  Should you have questions after your visit to [facility], please contact our office at [phone] and follow the prompts.  Our office hours are 8:00 a.m. and 4:30 p.m. Monday - Friday.  Please note that voicemails left after 4:00 p.m. may not be returned until  the following business day.  We are closed weekends and major holidays.  You do have access to a nurse 24-7, just call the main number to the clinic [phone] and do not press any options, hold on the line and a nurse will answer the phone.    For prescription refill requests, have your pharmacy contact our office and allow 72 hours.    Due to Covid, you will need to wear a mask upon entering the hospital. If you do not have a mask, a mask will be given to you at the Main Entrance upon arrival. For doctor visits, patients may have 1 support person age 63 or older with them. For treatment visits, patients can not have anyone with them due to social distancing guidelines and our immunocompromised population.

## 2021-03-16 ENCOUNTER — Other Ambulatory Visit: Payer: Self-pay

## 2021-03-16 ENCOUNTER — Ambulatory Visit (HOSPITAL_COMMUNITY)
Admission: RE | Admit: 2021-03-16 | Discharge: 2021-03-16 | Disposition: A | Discharge status: Home / Self Care | Payer: Medicare Other | Source: Ambulatory Visit | Attending: Cardiology | Admitting: Cardiology

## 2021-03-16 DIAGNOSIS — I350 Nonrheumatic aortic (valve) stenosis: Secondary | ICD-10-CM | POA: Diagnosis not present

## 2021-03-16 LAB — ECHOCARDIOGRAM COMPLETE
AR max vel: 0.65 cm2
AV Area VTI: 0.68 cm2
AV Area mean vel: 0.64 cm2
AV Mean grad: 39 mmHg
AV Peak grad: 61.6 mmHg
Ao pk vel: 3.93 m/s
Area-P 1/2: 2.3 cm2
P 1/2 time: 373 msec
S' Lateral: 3.6 cm

## 2021-03-16 NOTE — Progress Notes (Signed)
*  PRELIMINARY RESULTS* Echocardiogram 2D Echocardiogram has been performed.  Jerry Roach 03/16/2021, 9:31 AM

## 2021-05-22 DIAGNOSIS — Z08 Encounter for follow-up examination after completed treatment for malignant neoplasm: Secondary | ICD-10-CM | POA: Diagnosis not present

## 2021-05-22 DIAGNOSIS — X32XXXD Exposure to sunlight, subsequent encounter: Secondary | ICD-10-CM | POA: Diagnosis not present

## 2021-05-22 DIAGNOSIS — L570 Actinic keratosis: Secondary | ICD-10-CM | POA: Diagnosis not present

## 2021-05-22 DIAGNOSIS — Z85828 Personal history of other malignant neoplasm of skin: Secondary | ICD-10-CM | POA: Diagnosis not present

## 2021-08-11 ENCOUNTER — Other Ambulatory Visit: Payer: Self-pay

## 2021-08-11 ENCOUNTER — Inpatient Hospital Stay (HOSPITAL_COMMUNITY): Payer: Medicare Other | Attending: Hematology

## 2021-08-11 DIAGNOSIS — D696 Thrombocytopenia, unspecified: Secondary | ICD-10-CM | POA: Insufficient documentation

## 2021-08-11 LAB — CBC WITH DIFFERENTIAL/PLATELET
Abs Immature Granulocytes: 0.02 10*3/uL (ref 0.00–0.07)
Basophils Absolute: 0 10*3/uL (ref 0.0–0.1)
Basophils Relative: 1 %
Eosinophils Absolute: 0.2 10*3/uL (ref 0.0–0.5)
Eosinophils Relative: 3 %
HCT: 41.9 % (ref 39.0–52.0)
Hemoglobin: 13.9 g/dL (ref 13.0–17.0)
Immature Granulocytes: 0 %
Lymphocytes Relative: 19 %
Lymphs Abs: 1.4 10*3/uL (ref 0.7–4.0)
MCH: 33 pg (ref 26.0–34.0)
MCHC: 33.2 g/dL (ref 30.0–36.0)
MCV: 99.5 fL (ref 80.0–100.0)
Monocytes Absolute: 0.7 10*3/uL (ref 0.1–1.0)
Monocytes Relative: 10 %
Neutro Abs: 4.9 10*3/uL (ref 1.7–7.7)
Neutrophils Relative %: 67 %
Platelets: 127 10*3/uL — ABNORMAL LOW (ref 150–400)
RBC: 4.21 MIL/uL — ABNORMAL LOW (ref 4.22–5.81)
RDW: 12.8 % (ref 11.5–15.5)
WBC: 7.2 10*3/uL (ref 4.0–10.5)
nRBC: 0 % (ref 0.0–0.2)

## 2021-08-11 LAB — COMPREHENSIVE METABOLIC PANEL
ALT: 17 U/L (ref 0–44)
AST: 18 U/L (ref 15–41)
Albumin: 3.7 g/dL (ref 3.5–5.0)
Alkaline Phosphatase: 68 U/L (ref 38–126)
Anion gap: 5 (ref 5–15)
BUN: 17 mg/dL (ref 8–23)
CO2: 27 mmol/L (ref 22–32)
Calcium: 9.2 mg/dL (ref 8.9–10.3)
Chloride: 107 mmol/L (ref 98–111)
Creatinine, Ser: 1.13 mg/dL (ref 0.61–1.24)
GFR, Estimated: >60 mL/min (ref >60)
Glucose, Bld: 91 mg/dL (ref 70–99)
Potassium: 4.5 mmol/L (ref 3.5–5.1)
Sodium: 139 mmol/L (ref 135–145)
Total Bilirubin: 0.7 mg/dL (ref 0.3–1.2)
Total Protein: 6.8 g/dL (ref 6.5–8.1)

## 2021-08-11 LAB — LACTATE DEHYDROGENASE: LDH: 135 U/L (ref 98–192)

## 2021-08-17 NOTE — Progress Notes (Signed)
Jerry Roach, [place] [address]   CLINIC:  Medical Oncology/Hematology  PCP:  [facility], [place] [address] [phone]   REASON FOR VISIT:  Follow-up for mild thrombocytopenia  PRIOR THERAPY: None  CURRENT THERAPY: Observation  INTERVAL HISTORY:  Jerry Roach 75 y.o. male returns for routine follow-up of mild thrombocytopenia.  He was last seen by Tarri Abernethy PA-C on 02/10/2021.  At today's visit, he reports feeling at his usual baseline.  No recent hospitalizations, surgeries, or changes in baseline health status.  He admits to some mild easy bruising, but denies petechial rash.  No major bleeding events such as hematemesis, hematochezia, melena, epistaxis, gum bleeding, or hematuria.  He has not noted any B symptoms such as fever, chills, night sweats, unintentional weight loss.  He has 75% energy and 100% appetite. He endorses that he is maintaining a stable weight.   REVIEW OF SYSTEMS:  Review of Systems  Constitutional:  Positive for fatigue (mild, baseline energy 75%). Negative for appetite change, chills, diaphoresis, fever and unexpected weight change.  HENT:   Negative for lump/mass and nosebleeds.   Eyes:  Negative for eye problems.  Respiratory:  Positive for cough and shortness of breath. Negative for hemoptysis.   Cardiovascular:  Negative for chest pain, leg swelling and palpitations.  Gastrointestinal:  Negative for abdominal pain, blood in stool, constipation, diarrhea, nausea and vomiting.  Genitourinary:  Negative for hematuria.   Skin: Negative.   Neurological:  Negative for dizziness, headaches and light-headedness.  Hematological:  Does not bruise/bleed easily.     PAST MEDICAL/SURGICAL HISTORY:  Past Medical History:  Diagnosis Date   CAD (coronary artery disease)    a. 12/2017: s/p NSTEMI with DES to Proximal LAD. CTO of RCA with L--> R collaterals noted.     Hypercholesteremia    Nocturia    Weak urinary stream    Past Surgical History:  Procedure Laterality Date   CATARACT EXTRACTION W/PHACO Left 03/03/2015   Procedure: CATARACT EXTRACTION PHACO AND INTRAOCULAR LENS PLACEMENT (IOC);  Surgeon: Baruch Goldmann, MD;  Location: [facility];  Service: Ophthalmology;  Laterality: Left;  CDE 6.86   CATARACT EXTRACTION W/PHACO Right 08/31/2015   Procedure: CATARACT EXTRACTION PHACO AND INTRAOCULAR LENS PLACEMENT RIGHT EYE CDE=4.08;  Surgeon: Baruch Goldmann, MD;  Location: [facility];  Service: Ophthalmology;  Laterality: Right;   COLONOSCOPY  11/05/2011   Procedure: COLONOSCOPY;  Surgeon: Daneil Dolin, MD;  Location: [facility];  Service: Endoscopy;  Laterality: N/A;  7:30 AM   CORONARY STENT INTERVENTION N/A 12/27/2017   Procedure: CORONARY STENT INTERVENTION;  Surgeon: Nelva Bush, MD;  Location: [place] [facility];  Service: Cardiovascular;  Laterality: N/A;   LEFT HEART CATH AND CORONARY ANGIOGRAPHY N/A 12/27/2017   Procedure: LEFT HEART CATH AND CORONARY ANGIOGRAPHY;  Surgeon: Nelva Bush, MD;  Location: [facility];  Service: Cardiovascular;  Laterality: N/A;   NO PAST SURGERIES       SOCIAL HISTORY:  Social History   Socioeconomic History   Marital status: Married    Spouse name: Not on file   Number of children: Not on file   Years of education: Not on file   Highest education level: Not on file  Occupational History   Not on file  Tobacco Use   Smoking status: Former    Packs/day: 1.50    Years: 39.00    Pack years: 58.50    Types: Cigarettes  Quit date: 12/26/2017    Years since quitting: 3.6   Smokeless tobacco: Never  Vaping Use   Vaping Use: Never used  Substance and Sexual Activity   Alcohol use: No   Drug use: No   Sexual activity: Yes    Birth control/protection: None  Other Topics Concern   Not on file  Social History Narrative   Not on file   Social Determinants of Health   Financial Resource  Strain: Low Risk    Difficulty of Paying Living Expenses: Not hard at all  Food Insecurity: No Food Insecurity   Worried About Charity fundraiser in the Last Year: Never true   [place] in the Last Year: Never true  Transportation Needs: No Transportation Needs   Lack of Transportation (Medical): No   Lack of Transportation (Non-Medical): No  Physical Activity: Sufficiently Active   Days of Exercise per Week: 5 days   Minutes of Exercise per Session: 50 min  Stress: No Stress Concern Present   Feeling of Stress : Not at all  Social Connections: Socially Integrated   Frequency of Communication with Friends and Family: More than three times a week   Frequency of Social Gatherings with Friends and Family: More than three times a week   Attends Religious Services: 1 to 4 times per year   Active Member of [employer] or Organizations: No   Attends Music therapist: 1 to 4 times per year   Marital Status: Married  Human resources officer Violence: Not At Risk   Fear of Current or Ex-Partner: No   Emotionally Abused: No   Physically Abused: No   Sexually Abused: No    FAMILY HISTORY:  Family History  Problem Relation Age of Onset   Hypertension Father    COPD Mother    Hypertension Sister    COPD Sister    COPD Brother    Colon cancer Neg Hx     CURRENT MEDICATIONS:  Outpatient Encounter Medications as of 08/18/2021  Medication Sig   acetaminophen (TYLENOL) 325 MG tablet Take 2 tablets (650 mg total) by mouth every 6 (six) hours as needed for mild pain or headache.   alfuzosin (UROXATRAL) 10 MG 24 hr tablet Take 1 tablet (10 mg total) by mouth daily with breakfast.   aspirin EC 81 MG EC tablet Take 1 tablet (81 mg total) by mouth daily.   atorvastatin (LIPITOR) 80 MG tablet TAKE 1 TABLET(80 MG) BY MOUTH DAILY AT 6 PM   mometasone (NASONEX) 50 MCG/ACT nasal spray 2 sprays daily.   nitroGLYCERIN (NITROSTAT) 0.4 MG SL tablet Place 1 tablet (0.4 mg total) under the tongue  every 5 (five) minutes x 3 doses as needed for chest pain. (Patient not taking: Reported on 02/10/2021)   No facility-administered encounter medications on file as of 08/18/2021.    ALLERGIES:  No Known Allergies   PHYSICAL EXAM:  ECOG PERFORMANCE STATUS: 1 - Symptomatic but completely ambulatory  There were no vitals filed for this visit. There were no vitals filed for this visit. Physical Exam Constitutional:      Appearance: Normal appearance.  HENT:     Head: Normocephalic and atraumatic.     Mouth/Throat:     Mouth: Mucous membranes are moist.  Eyes:     Extraocular Movements: Extraocular movements intact.     Pupils: Pupils are equal, round, and reactive to light.  Cardiovascular:     Rate and Rhythm: Normal rate and regular rhythm.  Pulses: Normal pulses.     Heart sounds: Murmur heard.  Pulmonary:     Effort: Pulmonary effort is normal.     Comments: Quiet lungs Abdominal:     General: Bowel sounds are normal.     Palpations: Abdomen is soft.     Tenderness: There is no abdominal tenderness.  Musculoskeletal:        General: No swelling.     Right lower leg: No edema.     Left lower leg: No edema.  Lymphadenopathy:     Cervical: No cervical adenopathy.  Skin:    General: Skin is warm and dry.  Neurological:     General: No focal deficit present.     Mental Status: He is alert and oriented to person, place, and time.  Psychiatric:        Mood and Affect: Mood normal.        Behavior: Behavior normal.     LABORATORY DATA:  I have reviewed the labs as listed.  CBC    Component Value Date/Time   WBC 7.2 08/11/2021 0952   RBC 4.21 (L) 08/11/2021 0952   HGB 13.9 08/11/2021 0952   HCT 41.9 08/11/2021 0952   PLT 127 (L) 08/11/2021 0952   MCV 99.5 08/11/2021 0952   MCH 33.0 08/11/2021 0952   MCHC 33.2 08/11/2021 0952   RDW 12.8 08/11/2021 0952   LYMPHSABS 1.4 08/11/2021 0952   MONOABS 0.7 08/11/2021 0952   EOSABS 0.2 08/11/2021 0952   BASOSABS 0.0  08/11/2021 0952   CMP Latest Ref Rng & Units 08/11/2021 08/02/2020 08/31/2019  Glucose 70 - 99 mg/dL 91 103(H) -  BUN 8 - 23 mg/dL 17 15 -  Creatinine 0.61 - 1.24 mg/dL 1.13 1.05 1.20  Sodium 135 - 145 mmol/L 139 139 -  Potassium 3.5 - 5.1 mmol/L 4.5 3.9 -  Chloride 98 - 111 mmol/L 107 104 -  CO2 22 - 32 mmol/L 27 26 -  Calcium 8.9 - 10.3 mg/dL 9.2 9.5 -  Total Protein 6.5 - 8.1 g/dL 6.8 6.6 -  Total Bilirubin 0.3 - 1.2 mg/dL 0.7 0.8 -  Alkaline Phos 38 - 126 U/L 68 52 -  AST 15 - 41 U/L 18 21 -  ALT 0 - 44 U/L 17 17 -    DIAGNOSTIC IMAGING:  I have independently reviewed the relevant imaging and discussed with the patient.  ASSESSMENT & PLAN: 1.  Mild thrombocytopenia - Chronic mild thrombocytopenia since 2016 - Nutritional work-up unremarkable - normal B12, methylmalonic acid, folate, copper - Hepatitis B and hepatitis C negative, rheumatoid factor and ANA negative - H. pylori positive - He denies any bleeding events, bruising, or petechiae  - Most recent labs (08/11/2021): Platelets 127, essentially stable.  Normal CMP and LDH. - Differential diagnosis favors mild ITP in the setting of positive H. pylori, but cannot exclude early MDS or splenic sequestration at this time - PLAN: We will check abdominal ultrasound to evaluate any possible splenomegaly or early liver disease.  We will call patient with results. - Repeat labs and RTC in 6 months. - Patient can continue to take aspirin for CAD, no significant risk of bleeding with mild thrombocytopenia.  2.   Other history - PMH: NSTEMI and coronary artery disease, s/p PCI on 12/27/2017, aortic stenosis, and BPH - Social: Lives at home with wife.  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 - Remote history of heavy alcohol use while he was in the  [employer], but stopped drinking heavily about 50 years ago.   - He is a former smoker, but quit cigarettes 3 years ago at the time of his heart attack.  - His father had  alcoholic liver cirrhosis.  No family history of cancer or blood clots.   PLAN SUMMARY & DISPOSITION: Abdominal ultrasound Labs and RTC in 6 months  All questions were answered. The patient knows to call the clinic with any problems, questions or concerns.  Medical decision making: Low  Time spent on visit: I spent 20 minutes counseling the patient face to face. The total time spent in the appointment was 30 minutes and more than 50% was on counseling.   Harriett Rush, PA-C  08/18/2021 2:04 PM

## 2021-08-18 ENCOUNTER — Inpatient Hospital Stay (HOSPITAL_BASED_OUTPATIENT_CLINIC_OR_DEPARTMENT_OTHER): Payer: Medicare Other | Admitting: Physician Assistant

## 2021-08-18 ENCOUNTER — Other Ambulatory Visit: Payer: Self-pay

## 2021-08-18 VITALS — BP 127/66 | HR 67 | Temp 98.2°F | Resp 16 | Wt 201.0 lb

## 2021-08-18 DIAGNOSIS — D696 Thrombocytopenia, unspecified: Secondary | ICD-10-CM

## 2021-08-18 NOTE — Patient Instructions (Signed)
[place] at [facility] Discharge Instructions  You were seen today by Tarri Abernethy PA-C for your low platelets, which remain mildly low but stable  LABS: Return in 6 months for labs   OTHER TESTS: Abdominal ultrasound  MEDICATIONS: No changes  FOLLOW-UP APPOINTMENT: Office visit in 6 months   Thank you for choosing [place] at [facility] to provide your oncology and hematology care.  To afford each patient quality time with our provider, please arrive at least 15 minutes before your scheduled appointment time.   If you have a lab appointment with the [facility] please come in thru the Main Entrance and check in at the main information desk.  You need to re-schedule your appointment should you arrive 10 or more minutes late.  We strive to give you quality time with our providers, and arriving late affects you and other patients whose appointments are after yours.  Also, if you no show three or more times for appointments you may be dismissed from the clinic at the providers discretion.     Again, thank you for choosing [facility].  Our hope is that these requests will decrease the amount of time that you wait before being seen by our physicians.       _____________________________________________________________  Should you have questions after your visit to [facility], please contact our office at [phone] and follow the prompts.  Our office hours are 8:00 a.m. and 4:30 p.m. Monday - Friday.  Please note that voicemails left after 4:00 p.m. may not be returned until the following business day.  We are closed weekends and major holidays.  You do have access to a nurse 24-7, just call the main number to the clinic [phone] and do not press any options, hold on the line and a nurse will answer the phone.    For prescription refill requests, have your pharmacy contact our office and allow 72 hours.     Due to Covid, you will need to wear a mask upon entering the hospital. If you do not have a mask, a mask will be given to you at the Main Entrance upon arrival. For doctor visits, patients may have 1 support person age 15 or older with them. For treatment visits, patients can not have anyone with them due to social distancing guidelines and our immunocompromised population.

## 2021-08-25 ENCOUNTER — Ambulatory Visit (HOSPITAL_COMMUNITY)
Admission: RE | Admit: 2021-08-25 | Discharge: 2021-08-25 | Disposition: A | Discharge status: Home / Self Care | Payer: Medicare Other | Source: Ambulatory Visit | Attending: Physician Assistant | Admitting: Physician Assistant

## 2021-08-25 ENCOUNTER — Other Ambulatory Visit: Payer: Self-pay

## 2021-08-25 DIAGNOSIS — D696 Thrombocytopenia, unspecified: Secondary | ICD-10-CM | POA: Insufficient documentation

## 2021-08-25 IMAGING — US US ABDOMEN COMPLETE
1 series · 13 of 25 positions shown · non-contrast
Comparison: None.

CLINICAL DATA: Thrombocytopenia

EXAM:
ABDOMEN ULTRASOUND COMPLETE

[Series 1: us abdomen complete · 13 of 120 slices shown]
[im 1/120]
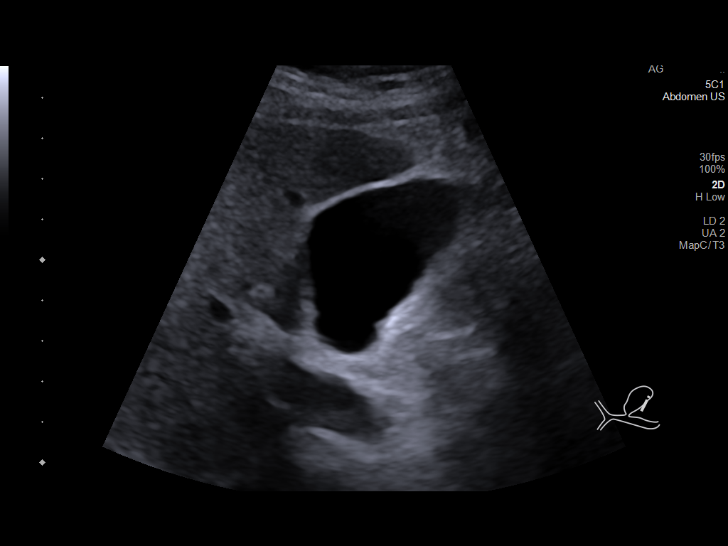
[im 10/120]
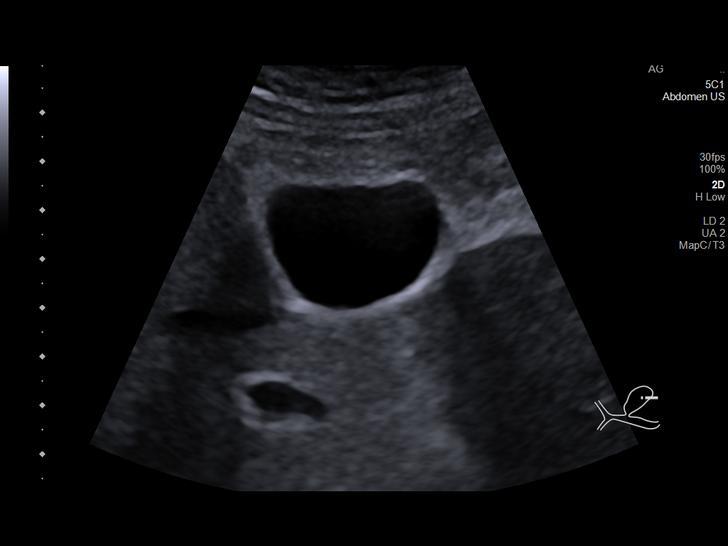
[im 20/120]
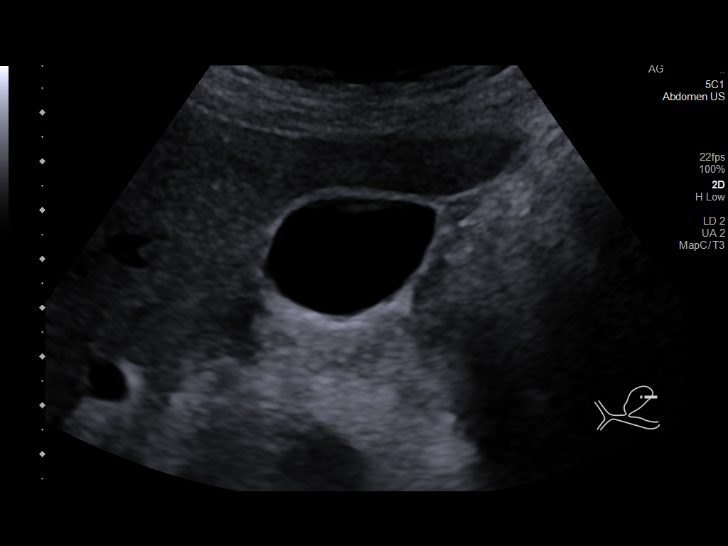
[im 30/120]
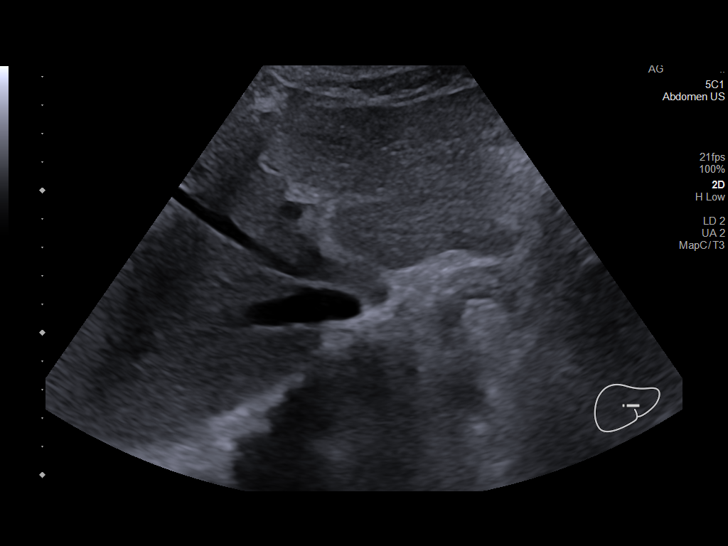
[im 40/120]
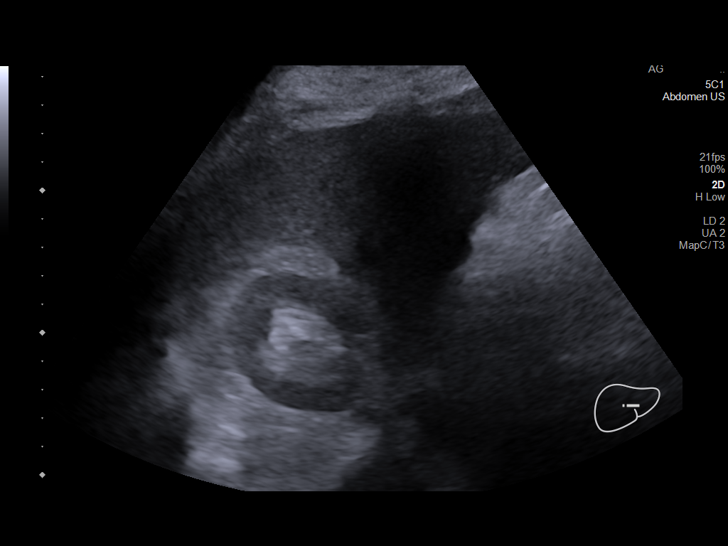
[im 50/120]
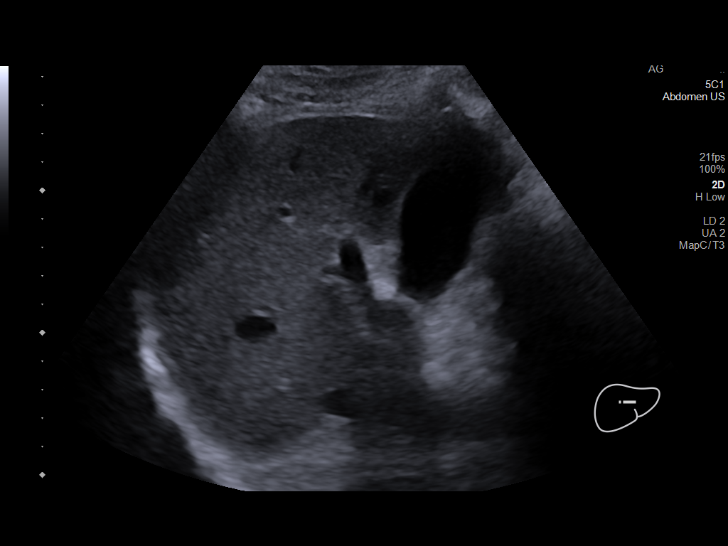
[im 60/120]
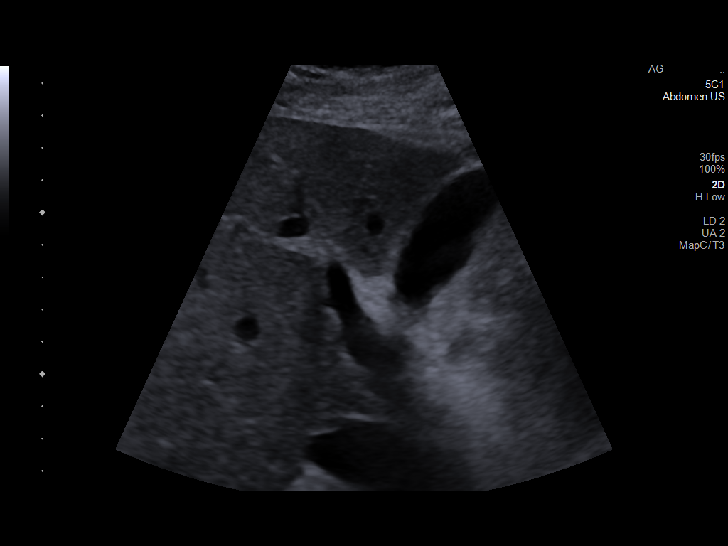
[im 70/120]
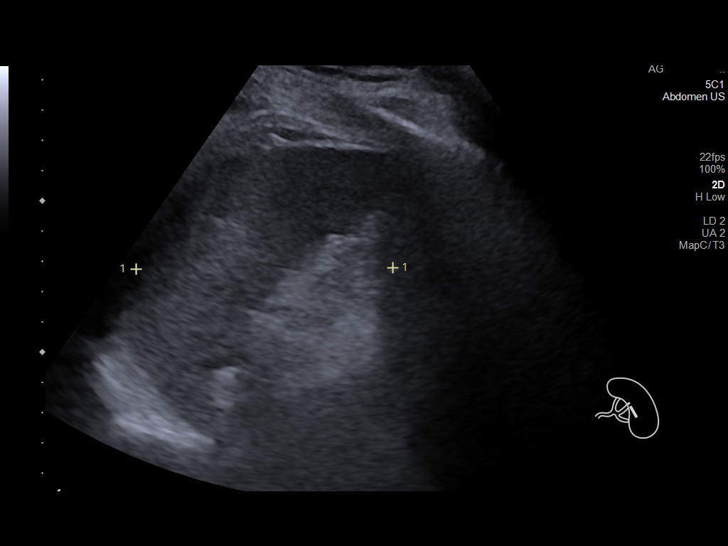
[im 80/120]
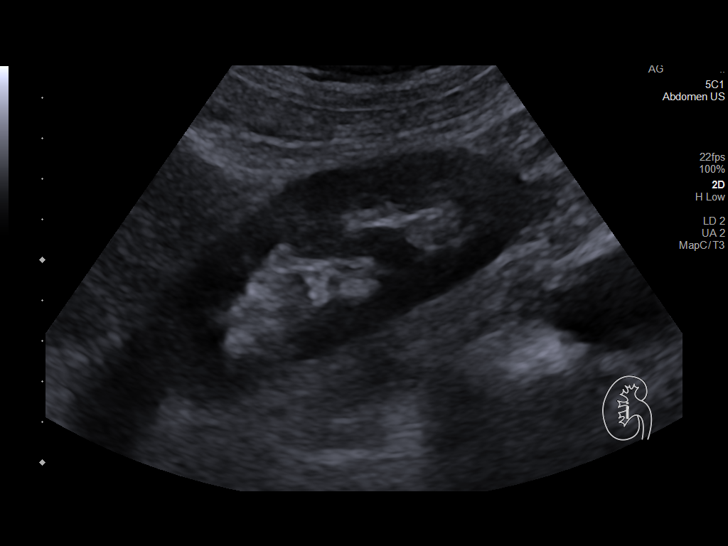
[im 90/120]
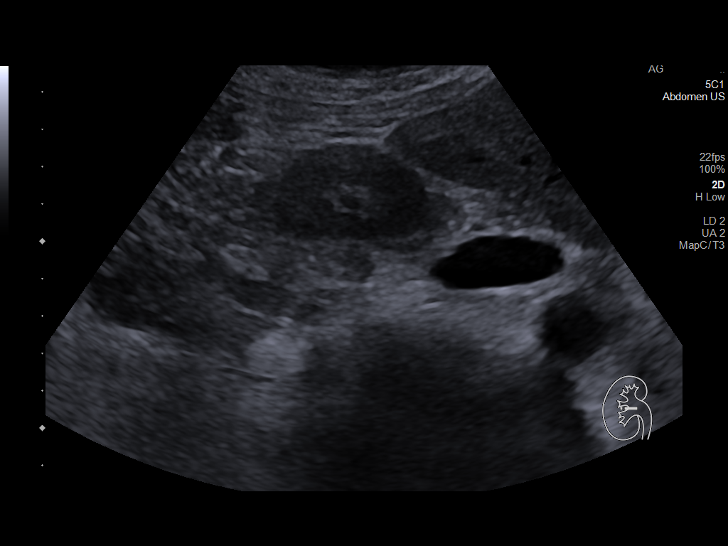
[im 100/120]
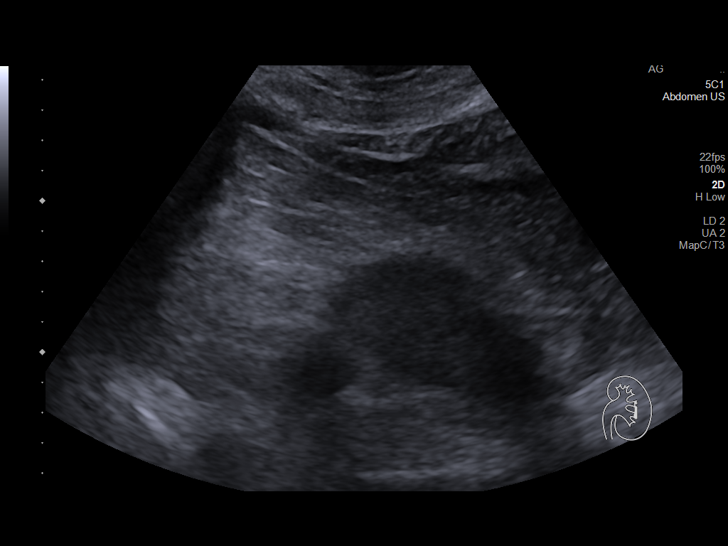
[im 110/120]
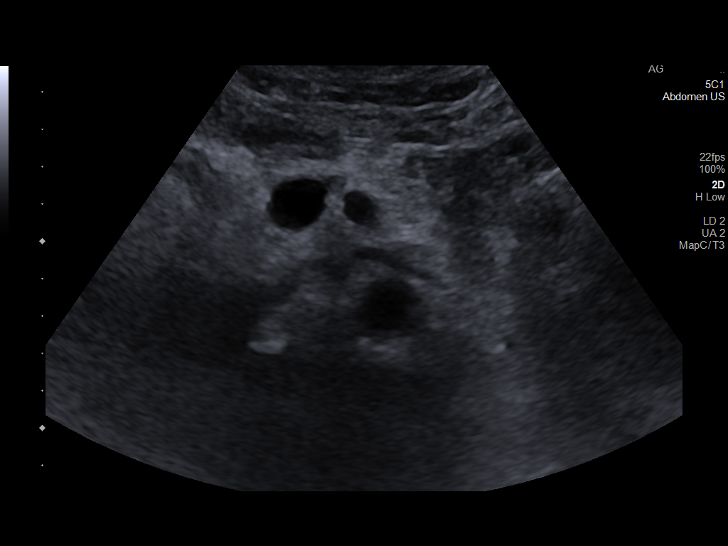
[im 120/120]
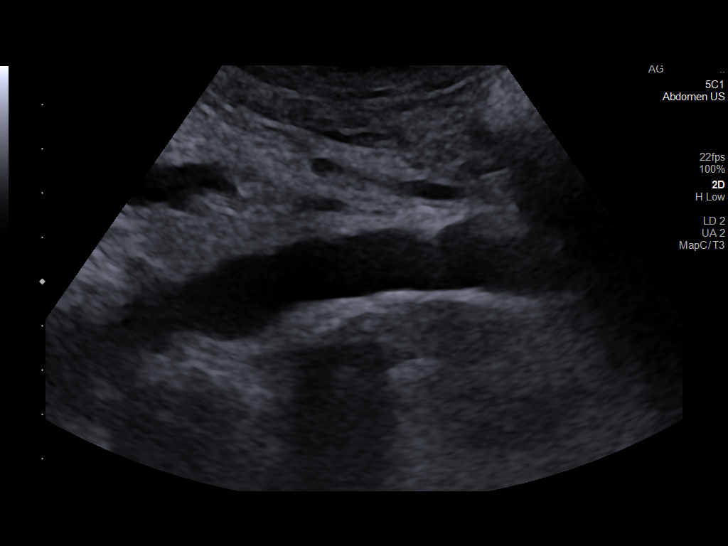

[13 of 25 positions shown; findings below may reference images not displayed]

FINDINGS: Gallbladder: No gallstones or wall thickening visualized. No
sonographic Murphy sign noted by sonographer.

Common bile duct: Diameter: 3 mm in proximal diameter

Liver: Hepatic parenchymal echogenicity and echotexture is normal.
There is suggestion of a vague ovoid subserosal isoechoic to
minimally hypoechoic mass within the inferior right hepatic lobe
measuring 4.5 cm in greatest dimension, best seen on image # [id number].
No other focal intrahepatic masses are identified. No intrahepatic
biliary ductal dilation. Portal vein is patent on color Doppler
imaging with normal direction of blood flow towards the liver.

IVC: No abnormality visualized.

Pancreas: Visualized portion unremarkable.

Spleen: Size and appearance within normal limits.

Right Kidney: Length: 11.0 cm. Echogenicity within normal limits. No
mass or hydronephrosis visualized.

Left Kidney: Length: 9.8 cm. Echogenicity within normal limits. No
mass or hydronephrosis visualized.

Abdominal aorta: No aneurysm visualized.

Other findings: None.
IMPRESSION: Possible 4.5 cm mass within the right hepatic lobe, nonspecific.
This would be better assessed with contrast enhanced liver protocol
MRI examination.

Normal examination of the spleen.

## 2021-08-28 DIAGNOSIS — H40053 Ocular hypertension, bilateral: Secondary | ICD-10-CM | POA: Diagnosis not present

## 2021-08-29 ENCOUNTER — Other Ambulatory Visit (HOSPITAL_COMMUNITY): Payer: Self-pay | Admitting: Physician Assistant

## 2021-08-29 DIAGNOSIS — K769 Liver disease, unspecified: Secondary | ICD-10-CM

## 2021-08-29 NOTE — Progress Notes (Signed)
Ultrasound shows nonspecific mass on liver.  Unknown cause of liver mass at this time, we will check MRI of liver with contrast for further characterization of mass.  RN POOL: Please call patient with the above information.  SCHEDULERS: Please schedule patient for MRI liver.  Phone visit with me 1 week after MRI.

## 2021-08-30 ENCOUNTER — Encounter (HOSPITAL_COMMUNITY): Payer: Self-pay

## 2021-08-30 NOTE — Progress Notes (Signed)
Note received from [place], [place] to let patient know of [place] results and need for MRI abdomen. Patient called, I spoke to both the patient and his wife. They are aware of results and upcoming appts. All questions answered to their satisfaction.

## 2021-09-08 ENCOUNTER — Ambulatory Visit (HOSPITAL_COMMUNITY): Payer: Medicare Other

## 2021-09-08 ENCOUNTER — Ambulatory Visit (HOSPITAL_COMMUNITY): Payer: Medicare Other | Attending: Physician Assistant

## 2021-09-12 ENCOUNTER — Telehealth (HOSPITAL_COMMUNITY): Payer: Medicare Other | Admitting: Physician Assistant

## 2021-09-19 ENCOUNTER — Ambulatory Visit (HOSPITAL_COMMUNITY): Payer: Medicare Other

## 2021-09-20 ENCOUNTER — Other Ambulatory Visit: Payer: Self-pay

## 2021-09-20 ENCOUNTER — Ambulatory Visit (HOSPITAL_COMMUNITY)
Admission: RE | Admit: 2021-09-20 | Discharge: 2021-09-20 | Disposition: A | Discharge status: Home / Self Care | Payer: Medicare Other | Source: Ambulatory Visit | Attending: Physician Assistant | Admitting: Physician Assistant

## 2021-09-20 DIAGNOSIS — R935 Abnormal findings on diagnostic imaging of other abdominal regions, including retroperitoneum: Secondary | ICD-10-CM | POA: Diagnosis not present

## 2021-09-20 DIAGNOSIS — K769 Liver disease, unspecified: Secondary | ICD-10-CM | POA: Diagnosis not present

## 2021-09-20 IMAGING — MR MR ABDOMEN WO/W CM
20 series · 48 of 48 positions shown · IV contrast (gadavist)
Comparison: Ultrasound [DATE]

CLINICAL DATA: Further evaluation of possible hepatic mass seen on
prior ultrasound.

EXAM:
MRI ABDOMEN WITHOUT AND WITH CONTRAST
TECHNIQUE: Multiplanar multisequence MR imaging of the abdomen was performed
both before and after the administration of intravenous contrast.
CONTRAST:  9mL GADAVIST GADOBUTROL 1 MMOL/ML IV SOLN

[Series 3: cor haste · coronal · 6.0mm · 1.25mm/px · 2 of 30 slices shown]
[im 1/30]
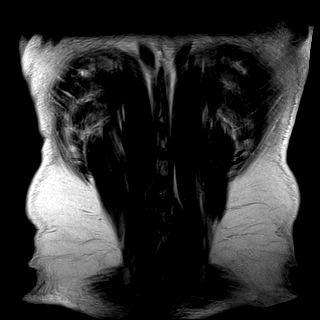
[im 30/30]
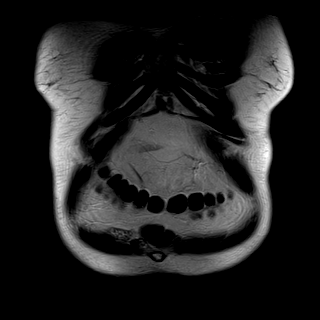

[Series 4: bSSFP · axial · 6.0mm · 0.78mm/px · z∈[-151,+131]mm · 3 of 48 slices shown]
[im 1/48]
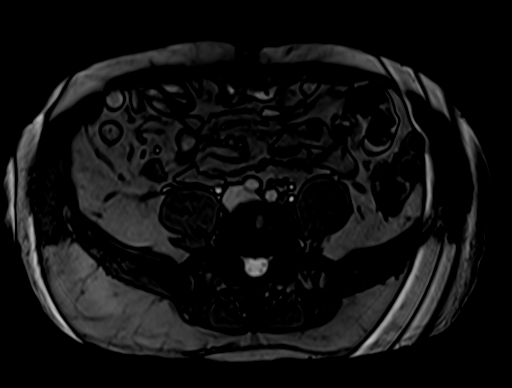
[im 24/48]
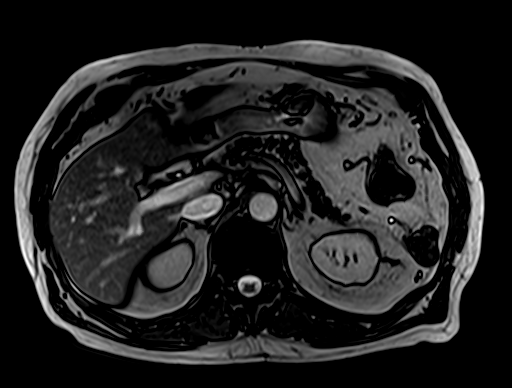
[im 48/48]
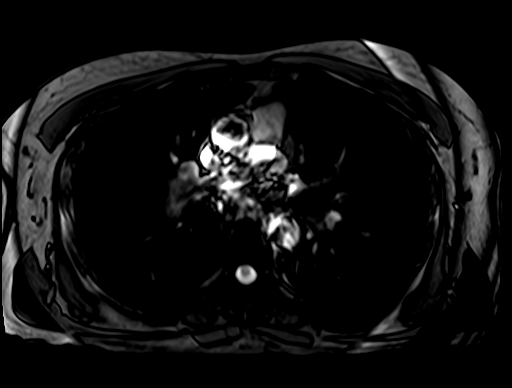

[Series 7: T2 fat-sat · axial · 6.0mm · 1.25mm/px · z∈[-115,+94]mm · 2 of 30 slices shown]
[im 1/30]
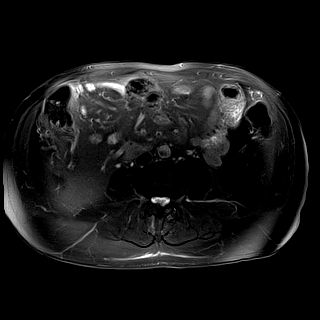
[im 30/30]
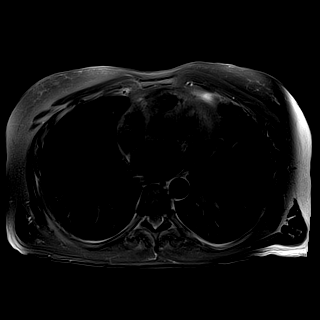

[Series 8: DWI · axial · 6.0mm · 1.49mm/px · 1 of 30 slices shown (1 of 4)]
[im 1/30]
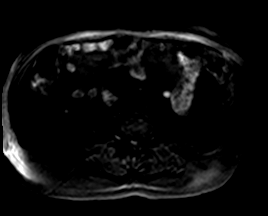

[Series 8: DWI · axial · 6.0mm · 1.49mm/px · 1 of 30 slices shown (2 of 4)]
[im 1/30]
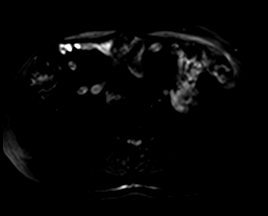

[Series 8: DWI · axial · 6.0mm · 1.49mm/px · 1 of 30 slices shown (3 of 4)]
[im 1/30]
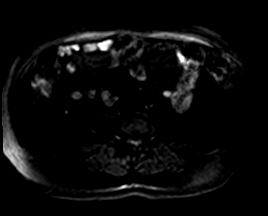

[Series 9: DWI · axial · 6.0mm · 1.49mm/px · 1 of 30 slices shown (4 of 4)]
[im 1/30]
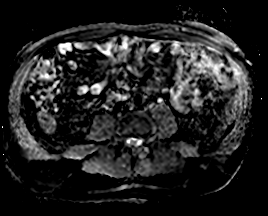

[Series 12: ax haste bh · axial · 6.0mm · 1.25mm/px · 1 of 30 slices shown]
[im 1/30]
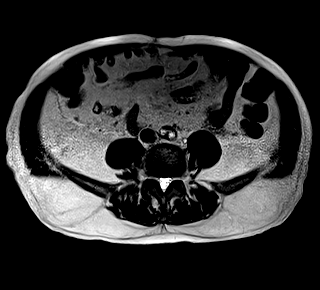

[Series 13: ax in and · axial · 3.0mm · 1.25mm/px · z∈[-155,+82]mm · 3 of 80 slices shown (1 of 2)]
[im 1/80]
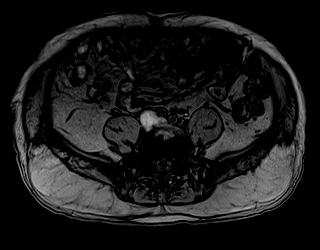
[im 40/80]
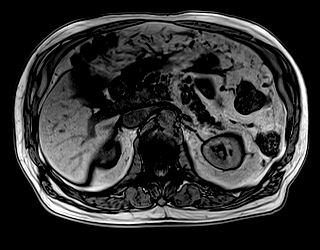
[im 80/80]
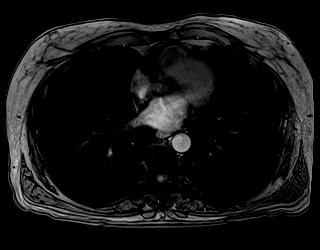

[Series 14: ax in and · axial · 3.0mm · 1.25mm/px · z∈[-155,+82]mm · 3 of 80 slices shown (2 of 2)]
[im 1/80]
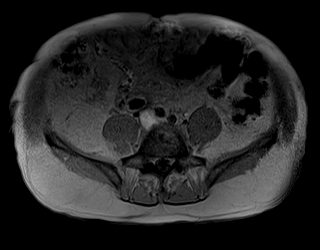
[im 40/80]
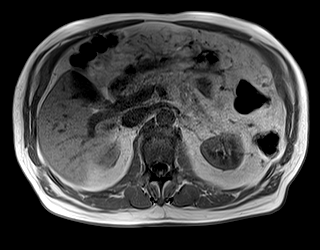
[im 80/80]
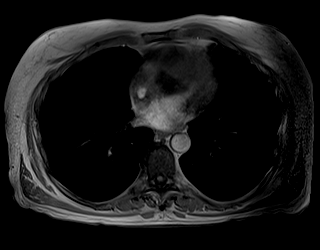

[Series 15: T1 dynamic · axial · 3.0mm · 1.19mm/px · z∈[-155,+82]mm · 3 of 80 slices shown (1 of 9)]
[im 1/80]
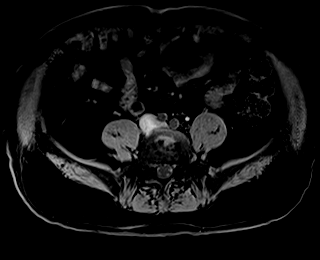
[im 40/80]
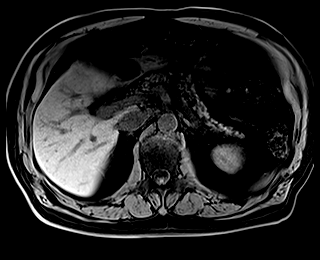
[im 80/80]
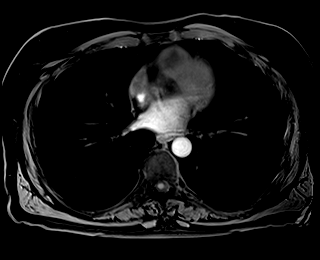

[Series 17: T1 dynamic · axial · 3.0mm · 1.19mm/px · z∈[-155,+82]mm · 3 of 80 slices shown (2 of 9)]
[im 1/80]
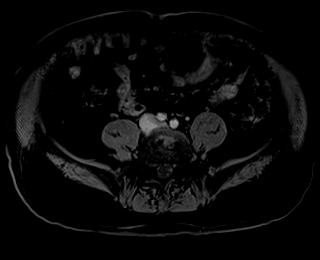
[im 40/80]
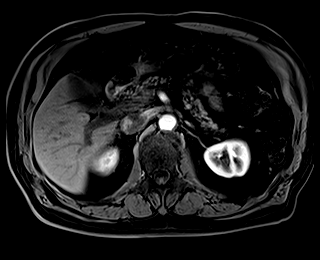
[im 80/80]
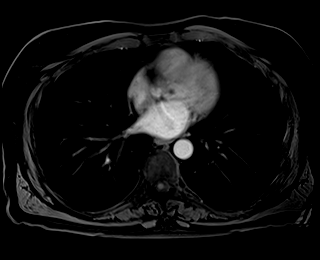

[Series 18: T1 dynamic · axial · 3.0mm · 1.19mm/px · z∈[-155,+82]mm · 3 of 80 slices shown (3 of 9)]
[im 1/80]
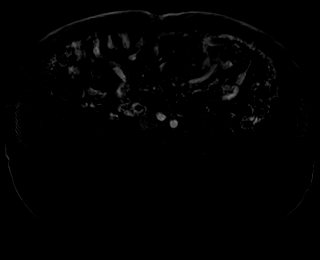
[im 40/80]
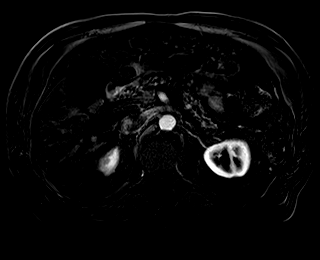
[im 80/80]
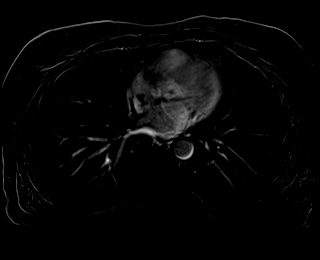

[Series 19: T1 dynamic · axial · 3.0mm · 1.19mm/px · z∈[-155,+82]mm · 3 of 80 slices shown (4 of 9)]
[im 1/80]
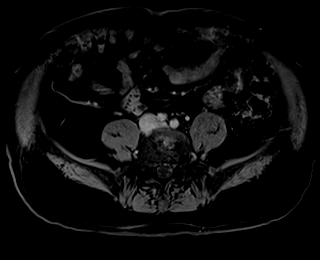
[im 40/80]
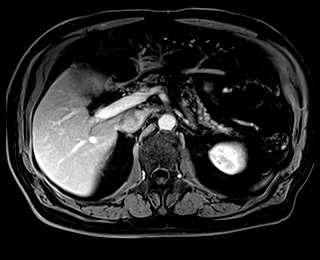
[im 80/80]
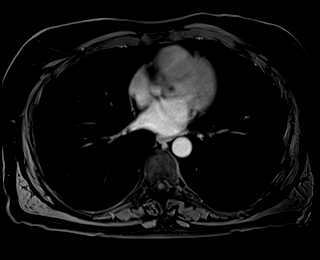

[Series 20: T1 dynamic · axial · 3.0mm · 1.19mm/px · z∈[-155,+82]mm · 3 of 80 slices shown (5 of 9)]
[im 1/80]
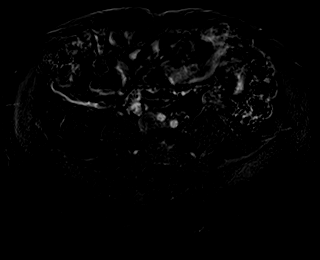
[im 40/80]
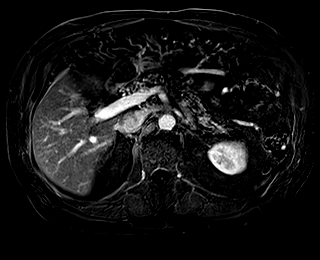
[im 80/80]
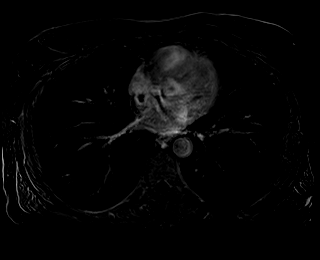

[Series 21: T1 dynamic · axial · 3.0mm · 1.19mm/px · z∈[-155,+82]mm · 3 of 80 slices shown (6 of 9)]
[im 1/80]
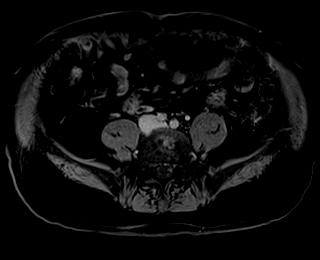
[im 40/80]
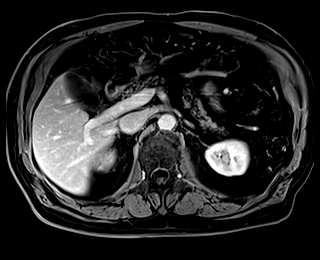
[im 80/80]
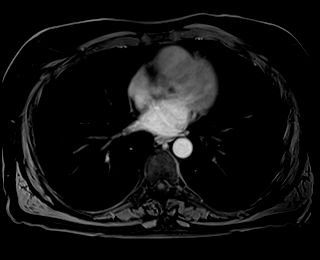

[Series 22: T1 dynamic · axial · 3.0mm · 1.19mm/px · z∈[-155,+82]mm · 3 of 80 slices shown (7 of 9)]
[im 1/80]
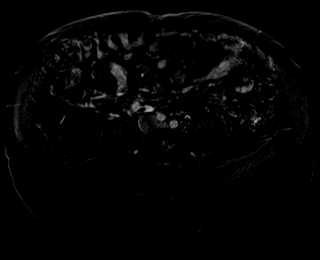
[im 40/80]
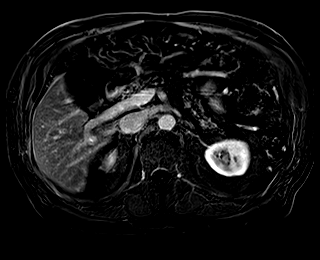
[im 80/80]
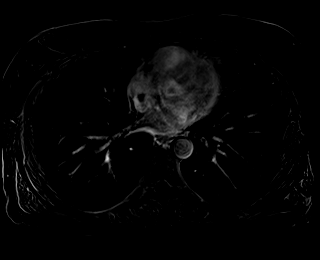

[Series 23: T1 dynamic · axial · 3.0mm · 1.19mm/px · z∈[-155,+82]mm · 3 of 80 slices shown (8 of 9)]
[im 1/80]
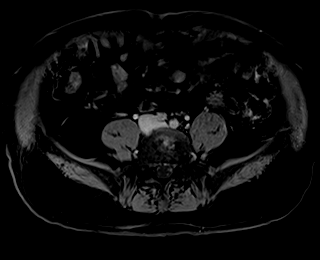
[im 40/80]
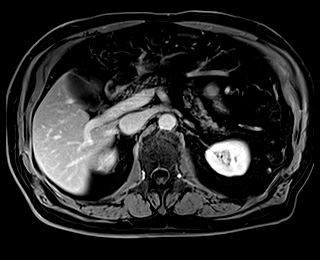
[im 80/80]
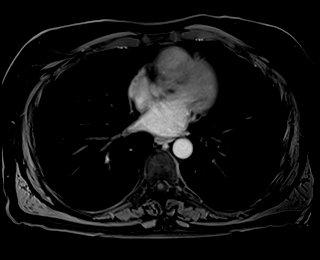

[Series 24: T1 dynamic · axial · 3.0mm · 1.19mm/px · z∈[-155,+82]mm · 3 of 80 slices shown (9 of 9)]
[im 1/80]
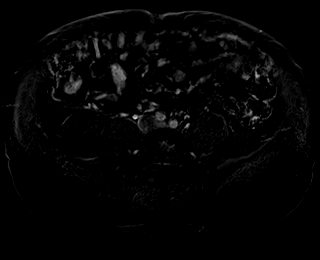
[im 40/80]
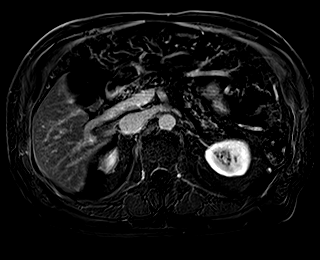
[im 80/80]
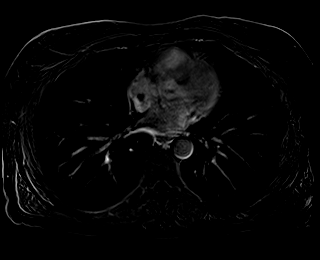

[Series 25: T1 dynamic post-contrast · coronal · 3.0mm · 1.31mm/px · 3 of 72 slices shown]
[im 1/72]
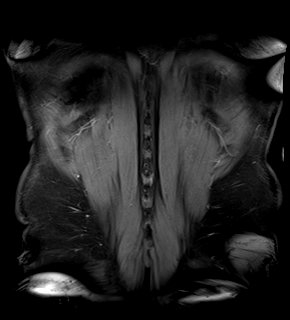
[im 36/72]
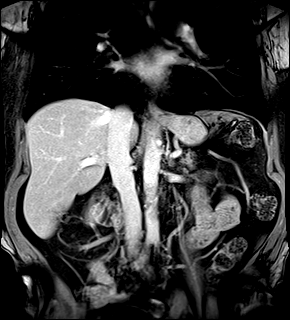
[im 72/72]
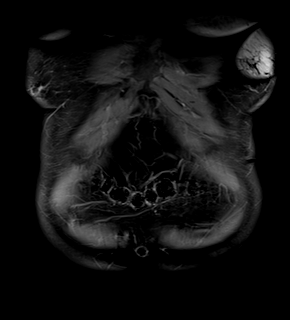

[48 of 48 positions shown; findings below may reference images not displayed]

FINDINGS: Lower chest: No acute abnormality.

Hepatobiliary: No hepatic steatosis. No suspicious hepatic lesion.
Gallbladder is unremarkable. No biliary ductal dilation.

Pancreas: Intrinsic T1 signal of the pancreatic parenchyma is within
normal limits. No pancreatic ductal dilation or evidence of acute
inflammation. No cystic or solid hyperenhancing pancreatic lesion
identified.

Spleen:  Within normal limits.

Adrenals/Urinary Tract: No masses identified. No evidence of
hydronephrosis.

Stomach/Bowel: Visualized portions within the abdomen are
unremarkable.

Vascular/Lymphatic: No pathologically enlarged lymph nodes
identified. No abdominal aortic aneurysm demonstrated.

Other:  None.

Musculoskeletal: No suspicious bone lesions identified.
IMPRESSION: Normal MRI of the abdomen.

No suspicious hepatic lesion identified. Specifically, no lesion in
the right hepatic lobe to correspond with findings on prior
ultrasound.

## 2021-09-20 MED ORDER — GADOBUTROL 1 MMOL/ML IV SOLN
9.0000 mL | Freq: Once | INTRAVENOUS | Status: AC | PRN
  Administered 2021-09-20: 11:00:00 9 mL via INTRAVENOUS

## 2021-09-21 NOTE — Progress Notes (Signed)
Patient called.  Patient aware and all questions were answered.

## 2021-09-25 ENCOUNTER — Telehealth (HOSPITAL_COMMUNITY): Payer: Medicare Other | Admitting: Physician Assistant

## 2021-09-26 DIAGNOSIS — Z20822 Contact with and (suspected) exposure to covid-19: Secondary | ICD-10-CM | POA: Diagnosis not present

## 2021-12-13 ENCOUNTER — Other Ambulatory Visit: Payer: Self-pay | Admitting: Cardiology

## 2021-12-27 ENCOUNTER — Other Ambulatory Visit: Payer: Self-pay

## 2021-12-27 ENCOUNTER — Ambulatory Visit (INDEPENDENT_AMBULATORY_CARE_PROVIDER_SITE_OTHER): Payer: Medicare Other | Admitting: Urology

## 2021-12-27 ENCOUNTER — Encounter: Payer: Self-pay | Admitting: Urology

## 2021-12-27 VITALS — BP 142/65 | HR 79

## 2021-12-27 DIAGNOSIS — N138 Other obstructive and reflux uropathy: Secondary | ICD-10-CM

## 2021-12-27 DIAGNOSIS — R351 Nocturia: Secondary | ICD-10-CM

## 2021-12-27 DIAGNOSIS — N401 Enlarged prostate with lower urinary tract symptoms: Secondary | ICD-10-CM | POA: Diagnosis not present

## 2021-12-27 DIAGNOSIS — R3912 Poor urinary stream: Secondary | ICD-10-CM | POA: Diagnosis not present

## 2021-12-27 MED ORDER — ALFUZOSIN HCL ER 10 MG PO TB24: 10.0000 mg | ORAL_TABLET | Freq: Every day | ORAL | 3 refills | Status: DC

## 2021-12-27 NOTE — Progress Notes (Signed)
? ?12/27/2021 ?10:26 AM  ? ?Jerry Roach ?07-Jun-1946 ?[id number] ? ?Referring provider: Redmond School, MD ?[address] ?[place],  [place] [address] ? ?Followup BPH and nocturia ? ? ?HPI: ?Jerry Roach is a 76yo here for followup for BPH with weak stream and nocturia. IPSS 10 QOL 2 on uroxatral 10mg  qhs. He drinks 3-4 sun drops int he afternoon which makes his urinary frequency and urgency worse. He drinks 4 cups of coffee in the morning. Nocturia 1x. Urine stream strong. NO other complaints today.  ? ? ?PMH: ?Past Medical History:  ?Diagnosis Date  ? CAD (coronary artery disease)   ? a. 12/2017: s/p NSTEMI with DES to Proximal LAD. CTO of RCA with L--> R collaterals noted.   ? Hypercholesteremia   ? Nocturia   ? Weak urinary stream   ? ? ?Surgical History: ?Past Surgical History:  ?Procedure Laterality Date  ? CATARACT EXTRACTION W/PHACO Left 03/03/2015  ? Procedure: CATARACT EXTRACTION PHACO AND INTRAOCULAR LENS PLACEMENT (IOC);  Surgeon: Baruch Goldmann, MD;  [facility];  Service: Ophthalmology;  Laterality: Left;  CDE 6.86  ? CATARACT EXTRACTION W/PHACO Right 08/31/2015  ? Procedure: CATARACT EXTRACTION PHACO AND INTRAOCULAR LENS PLACEMENT RIGHT EYE CDE=4.08;  Surgeon: Baruch Goldmann, MD;  Location: [facility];  Service: Ophthalmology;  Laterality: Right;  ? COLONOSCOPY  11/05/2011  ? Procedure: COLONOSCOPY;  Surgeon: Daneil Dolin, MD;  [facility];  Service: Endoscopy;  Laterality: N/A;  7:30 AM  ? CORONARY STENT INTERVENTION N/A 12/27/2017  ? Procedure: CORONARY STENT INTERVENTION;  Surgeon: Nelva Bush, MD;  Location: [place];  Service: Cardiovascular;  Laterality: N/A;  ? LEFT HEART CATH AND CORONARY ANGIOGRAPHY N/A 12/27/2017  ? Procedure: LEFT HEART CATH AND CORONARY ANGIOGRAPHY;  Surgeon: Nelva Bush, MD;  Location: [facility];  Service: Cardiovascular;  Laterality: N/A;  ? NO PAST SURGERIES    ? ? ?Home Medications:  ?Allergies as of 12/27/2021   ?No Known  Allergies ?  ? ?  ?Medication List  ?  ? ?  ? Accurate as of December 27, 2021 10:26 AM. If you have any questions, ask your nurse or doctor.  ?  ?  ? ?  ? ?acetaminophen 325 MG tablet ?Commonly known as: TYLENOL ?Take 2 tablets (650 mg total) by mouth every 6 (six) hours as needed for mild pain or headache. ?  ?alfuzosin 10 MG 24 hr tablet ?Commonly known as: UROXATRAL ?Take 1 tablet (10 mg total) by mouth daily with breakfast. ?  ?aspirin 81 MG EC tablet ?Take 1 tablet (81 mg total) by mouth daily. ?  ?atorvastatin 80 MG tablet ?Commonly known as: LIPITOR ?TAKE 1 TABLET(80 MG) BY MOUTH DAILY AT 6 PM ?  ?mometasone 50 MCG/ACT nasal spray ?Commonly known as: NASONEX ?2 sprays daily. ?  ?nitroGLYCERIN 0.4 MG SL tablet ?Commonly known as: NITROSTAT ?Place 1 tablet (0.4 mg total) under the tongue every 5 (five) minutes x 3 doses as needed for chest pain. ?  ? ?  ? ? ?Allergies: No Known Allergies ? ?Family History: ?Family History  ?Problem Relation Age of Onset  ? Hypertension Father   ? COPD Mother   ? Hypertension Sister   ? COPD Sister   ? COPD Brother   ? Colon cancer Neg Hx   ? ? ?Social History:  reports that he quit smoking about 4 years ago. His smoking use included cigarettes. He has a 58.50 pack-year smoking history. He has never used smokeless  tobacco. He reports that he does not drink alcohol and does not use drugs. ? ?ROS: ?All other review of systems were reviewed and are negative except what is noted above in HPI ? ?Physical Exam: ?BP (!) 142/65   Pulse 79   ?Constitutional:  Alert and oriented, No acute distress. ?HEENT: [place] AT, moist mucus membranes.  Trachea midline, no masses. ?Cardiovascular: No clubbing, cyanosis, or edema. ?Respiratory: Normal respiratory effort, no increased work of breathing. ?GI: Abdomen is soft, nontender, nondistended, no abdominal masses ?GU: No CVA tenderness.  ?Lymph: No cervical or inguinal lymphadenopathy. ?Skin: No rashes, bruises or suspicious lesions. ?Neurologic:  Grossly intact, no focal deficits, moving all 4 extremities. ?Psychiatric: Normal mood and affect. ? ?Laboratory Data: ?Lab Results  ?Component Value Date  ? WBC 7.2 08/11/2021  ? HGB 13.9 08/11/2021  ? HCT 41.9 08/11/2021  ? MCV 99.5 08/11/2021  ? PLT 127 (L) 08/11/2021  ? ? ?Lab Results  ?Component Value Date  ? CREATININE 1.13 08/11/2021  ? ? ?No results found for: PSA ? ?No results found for: TESTOSTERONE ? ?No results found for: HGBA1C ? ?Urinalysis ?   ?Component Value Date/Time  ? APPEARANCEUR Clear 12/28/2020 1028  ? GLUCOSEU Negative 12/28/2020 1028  ? BILIRUBINUR Negative 12/28/2020 1028  ? PROTEINUR Negative 12/28/2020 1028  ? NITRITE Negative 12/28/2020 1028  ? LEUKOCYTESUR Negative 12/28/2020 1028  ? ? ?Lab Results  ?Component Value Date  ? LABMICR Comment 12/28/2020  ? ? ?Pertinent Imaging: ? ?No results found for this or any previous visit. ? ?No results found for this or any previous visit. ? ?No results found for this or any previous visit. ? ?No results found for this or any previous visit. ? ?No results found for this or any previous visit. ? ?No results found for this or any previous visit. ? ?No results found for this or any previous visit. ? ?No results found for this or any previous visit. ? ? ?Assessment & Plan:   ? ?1. Weak urinary stream ?-Continue uroxatral 10mg  qhs ? ?2. Nocturia ?-decrease caffeine intake ? ?3. Benign prostatic hyperplasia with urinary obstruction ?-continue uroxatral 10mg  qhs ?-PSA today, will call with results ? ? ?No follow-ups on file. ? ?Nicolette Bang, MD ? ?[facility] ?  ?

## 2021-12-27 NOTE — Patient Instructions (Signed)

## 2021-12-28 LAB — PSA: Prostate Specific Ag, Serum: 0.6 ng/mL (ref 0.0–4.0)

## 2021-12-29 ENCOUNTER — Telehealth: Payer: Self-pay

## 2021-12-29 NOTE — Telephone Encounter (Signed)
Returned call and patient made aware of psa results of 0.6.  ?OK to give per Dr. Alyson Ingles. ?

## 2022-01-09 NOTE — Progress Notes (Signed)
Letter sent.

## 2022-01-19 ENCOUNTER — Other Ambulatory Visit: Payer: Self-pay | Admitting: Cardiology

## 2022-02-23 ENCOUNTER — Inpatient Hospital Stay (HOSPITAL_COMMUNITY): Payer: Medicare Other

## 2022-02-26 ENCOUNTER — Inpatient Hospital Stay (HOSPITAL_COMMUNITY): Payer: Medicare Other | Attending: Hematology

## 2022-02-26 DIAGNOSIS — D696 Thrombocytopenia, unspecified: Secondary | ICD-10-CM | POA: Diagnosis not present

## 2022-02-26 LAB — CBC WITH DIFFERENTIAL/PLATELET
Abs Immature Granulocytes: 0.03 10*3/uL (ref 0.00–0.07)
Basophils Absolute: 0 10*3/uL (ref 0.0–0.1)
Basophils Relative: 1 %
Eosinophils Absolute: 0.1 10*3/uL (ref 0.0–0.5)
Eosinophils Relative: 1 %
HCT: 39.9 % (ref 39.0–52.0)
Hemoglobin: 13.8 g/dL (ref 13.0–17.0)
Immature Granulocytes: 0 %
Lymphocytes Relative: 18 %
Lymphs Abs: 1.5 10*3/uL (ref 0.7–4.0)
MCH: 32.6 pg (ref 26.0–34.0)
MCHC: 34.6 g/dL (ref 30.0–36.0)
MCV: 94.3 fL (ref 80.0–100.0)
Monocytes Absolute: 0.5 10*3/uL (ref 0.1–1.0)
Monocytes Relative: 6 %
Neutro Abs: 6.1 10*3/uL (ref 1.7–7.7)
Neutrophils Relative %: 74 %
Platelets: 135 10*3/uL — ABNORMAL LOW (ref 150–400)
RBC: 4.23 MIL/uL (ref 4.22–5.81)
RDW: 12.7 % (ref 11.5–15.5)
WBC: 8.3 10*3/uL (ref 4.0–10.5)
nRBC: 0 % (ref 0.0–0.2)

## 2022-02-26 LAB — COMPREHENSIVE METABOLIC PANEL
ALT: 16 U/L (ref 0–44)
AST: 22 U/L (ref 15–41)
Albumin: 3.8 g/dL (ref 3.5–5.0)
Alkaline Phosphatase: 75 U/L (ref 38–126)
Anion gap: 5 (ref 5–15)
BUN: 14 mg/dL (ref 8–23)
CO2: 25 mmol/L (ref 22–32)
Calcium: 9.2 mg/dL (ref 8.9–10.3)
Chloride: 108 mmol/L (ref 98–111)
Creatinine, Ser: 1.25 mg/dL — ABNORMAL HIGH (ref 0.61–1.24)
GFR, Estimated: >60 mL/min (ref >60)
Glucose, Bld: 102 mg/dL — ABNORMAL HIGH (ref 70–99)
Potassium: 4 mmol/L (ref 3.5–5.1)
Sodium: 138 mmol/L (ref 135–145)
Total Bilirubin: 0.7 mg/dL (ref 0.3–1.2)
Total Protein: 6.9 g/dL (ref 6.5–8.1)

## 2022-02-26 LAB — VITAMIN B12: Vitamin B-12: 252 pg/mL (ref 180–914)

## 2022-02-26 LAB — LACTATE DEHYDROGENASE: LDH: 150 U/L (ref 98–192)

## 2022-02-26 LAB — FOLATE: Folate: 11.4 ng/mL (ref >5.9)

## 2022-02-27 LAB — HOMOCYSTEINE: Homocysteine: 19.7 umol/L — ABNORMAL HIGH (ref 0.0–19.2)

## 2022-03-01 NOTE — Progress Notes (Deleted)
NO SHOW

## 2022-03-02 ENCOUNTER — Inpatient Hospital Stay (HOSPITAL_COMMUNITY): Payer: Medicare Other | Admitting: Physician Assistant

## 2022-03-02 LAB — METHYLMALONIC ACID, SERUM: Methylmalonic Acid, Quantitative: 172 nmol/L (ref 0–378)

## 2022-03-07 NOTE — Progress Notes (Unsigned)
[place], [place] [address]   CLINIC:  Medical Oncology/Hematology  PCP:  Redmond School, [place] [address] [phone]   REASON FOR VISIT:  Follow-up for mild thrombocytopenia  PRIOR THERAPY: None  CURRENT THERAPY: Observation  INTERVAL HISTORY:  [name] 76 y.o. male returns for routine follow-up of mild thrombocytopenia.  He was last seen by Tarri Abernethy PA-C on 08/18/2021.  At today's visit, he reports feeling at his usual baseline.  ***  No recent hospitalizations, surgeries, or changes in baseline health status.  He admits to some mild easy bruising, but denies petechial rash.  *** No major bleeding events such as hematemesis, hematochezia, melena, epistaxis, gum bleeding, or hematuria.  *** He has not noted any B symptoms such as fever, chills, night sweats, unintentional weight loss. ***   He has  *** % energy and  *** % appetite. He endorses that he is maintaining a stable weight.   REVIEW OF SYSTEMS:  Review of Systems - Oncology    PAST MEDICAL/SURGICAL HISTORY:  Past Medical History:  Diagnosis Date   CAD (coronary artery disease)    a. 12/2017: s/p NSTEMI with DES to Proximal LAD. CTO of RCA with L--> R collaterals noted.    Hypercholesteremia    Nocturia    Weak urinary stream    Past Surgical History:  Procedure Laterality Date   CATARACT EXTRACTION W/PHACO Left 03/03/2015   Procedure: CATARACT EXTRACTION PHACO AND INTRAOCULAR LENS PLACEMENT (IOC);  Surgeon: Baruch Goldmann, MD;  Location: [facility];  Service: Ophthalmology;  Laterality: Left;  CDE 6.86   CATARACT EXTRACTION W/PHACO Right 08/31/2015   Procedure: CATARACT EXTRACTION PHACO AND INTRAOCULAR LENS PLACEMENT RIGHT EYE CDE=4.08;  Surgeon: Baruch Goldmann, MD;  Location: [facility];  Service: Ophthalmology;  Laterality: Right;   COLONOSCOPY  11/05/2011   Procedure: COLONOSCOPY;  Surgeon: Daneil Dolin, MD;  [facility];  Service: Endoscopy;  Laterality: N/A;  7:30 AM   CORONARY STENT INTERVENTION N/A 12/27/2017   Procedure: CORONARY STENT INTERVENTION;  Surgeon: Nelva Bush, MD;  Location: [facility];  Service: Cardiovascular;  Laterality: N/A;   LEFT HEART CATH AND CORONARY ANGIOGRAPHY N/A 12/27/2017   Procedure: LEFT HEART CATH AND CORONARY ANGIOGRAPHY;  Surgeon: Nelva Bush, MD;  Location: [facility];  Service: Cardiovascular;  Laterality: N/A;   NO PAST SURGERIES       SOCIAL HISTORY:  Social History   Socioeconomic History   Marital status: Married    Spouse name: Not on file   Number of children: Not on file   Years of education: Not on file   Highest education level: Not on file  Occupational History   Not on file  Tobacco Use   Smoking status: Former    Packs/day: 1.50    Years: 39.00    Pack years: 58.50    Types: Cigarettes    Quit date: 12/26/2017    Years since quitting: 4.1   Smokeless tobacco: Never  Vaping Use   Vaping Use: Never used  Substance and Sexual Activity   Alcohol use: No   Drug use: No   Sexual activity: Yes    Birth control/protection: None  Other Topics Concern   Not on file  Social History Narrative   Not on file   Social Determinants of Health   Financial Resource Strain: Not on file  Food Insecurity: Not on file  Transportation Needs: Not on  file  Physical Activity: Not on file  Stress: Not on file  Social Connections: Not on file  Intimate Partner Violence: Not on file    FAMILY HISTORY:  Family History  Problem Relation Age of Onset   Hypertension Father    COPD Mother    Hypertension Sister    COPD Sister    COPD Brother    Colon cancer Neg Hx     CURRENT MEDICATIONS:  Outpatient Encounter Medications as of 03/08/2022  Medication Sig   acetaminophen (TYLENOL) 325 MG tablet Take 2 tablets (650 mg total) by mouth every 6 (six) hours as needed for mild pain or headache.   alfuzosin (UROXATRAL) 10 MG 24 hr  tablet Take 1 tablet (10 mg total) by mouth daily with breakfast.   aspirin EC 81 MG EC tablet Take 1 tablet (81 mg total) by mouth daily.   atorvastatin (LIPITOR) 80 MG tablet TAKE 1 TABLET(80 MG) BY MOUTH DAILY AT 6 PM   mometasone (NASONEX) 50 MCG/ACT nasal spray 2 sprays daily.   nitroGLYCERIN (NITROSTAT) 0.4 MG SL tablet Place 1 tablet (0.4 mg total) under the tongue every 5 (five) minutes x 3 doses as needed for chest pain.   No facility-administered encounter medications on file as of 03/08/2022.    ALLERGIES:  No Known Allergies   PHYSICAL EXAM:  ECOG PERFORMANCE STATUS: {CHL ONC ECOG PS:[id number]}  There were no vitals filed for this visit. There were no vitals filed for this visit. Physical Exam   LABORATORY DATA:  I have reviewed the labs as listed.  CBC    Component Value Date/Time   WBC 8.3 02/26/2022 1256   RBC 4.23 02/26/2022 1256   HGB 13.8 02/26/2022 1256   HCT 39.9 02/26/2022 1256   PLT 135 (L) 02/26/2022 1256   MCV 94.3 02/26/2022 1256   MCH 32.6 02/26/2022 1256   MCHC 34.6 02/26/2022 1256   RDW 12.7 02/26/2022 1256   LYMPHSABS 1.5 02/26/2022 1256   MONOABS 0.5 02/26/2022 1256   EOSABS 0.1 02/26/2022 1256   BASOSABS 0.0 02/26/2022 1256      Latest Ref Rng & Units 02/26/2022   12:56 PM 08/11/2021    9:52 AM 08/02/2020    1:40 PM  CMP  Glucose 70 - 99 mg/dL 102   91   103    BUN 8 - 23 mg/dL 14   17   15     Creatinine 0.61 - 1.24 mg/dL 1.25   1.13   1.05    Sodium 135 - 145 mmol/L 138   139   139    Potassium 3.5 - 5.1 mmol/L 4.0   4.5   3.9    Chloride 98 - 111 mmol/L 108   107   104    CO2 22 - 32 mmol/L 25   27   26     Calcium 8.9 - 10.3 mg/dL 9.2   9.2   9.5    Total Protein 6.5 - 8.1 g/dL 6.9   6.8   6.6    Total Bilirubin 0.3 - 1.2 mg/dL 0.7   0.7   0.8    Alkaline Phos 38 - 126 U/L 75   68   52    AST 15 - 41 U/L 22   18   21     ALT 0 - 44 U/L 16   17   17       DIAGNOSTIC IMAGING:  I have independently reviewed the relevant imaging  and discussed with  the patient.  ASSESSMENT & PLAN: 1.  Mild thrombocytopenia - Chronic mild thrombocytopenia since 2016 - Nutritional work-up unremarkable - normal B12, methylmalonic acid, folate, copper - Hepatitis B and hepatitis C negative, rheumatoid factor and ANA negative - H. pylori positive - Abdominal ultrasound (08/25/2021): Spleen size and appearance within normal limits.  Possible 4.5 cm mass within the right hepatic lobe. - MRI liver (09/20/2021): No hepatic steatosis, no suspicious hepatic lesion.  Spleen within normal limits. - He denies any bleeding events, bruising, or petechiae  ***  - Most recent labs (02/26/2022): Platelets 135, stable at baseline.  CMP unremarkable.  Normal folate and B12.  Homocystine mildly elevated, methylmalonic acid normal.   - Differential diagnosis favors mild ITP in the setting of positive H. pylori, but cannot exclude early MDS  - PLAN: Recommend the patient start taking Centrum Silver adult multivitamin, which contains 622 mcg of folic acid and vitamin B12, for treatment of his elevated homocystine. - No other intervention needed at this time.  Can continue active surveillance. - Repeat labs and RTC in 6 months with PHONE visit. *** - Patient can continue to take aspirin for CAD, no significant risk of bleeding with mild thrombocytopenia.  2.   Other history - PMH: NSTEMI and coronary artery disease, s/p PCI on 12/27/2017, aortic stenosis, and BPH - Social: Lives at home with wife.  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 - Remote history of heavy alcohol use while he was in the [employer], but stopped drinking heavily about 50 years ago.   - He is a former smoker, but quit cigarettes 3 years ago at the time of his heart attack.  - His father had alcoholic liver cirrhosis.  No family history of cancer or blood clots.   PLAN SUMMARY & DISPOSITION: Labs and RTC with phone visit in 6 months ***  All questions were answered.  The patient knows to call the clinic with any problems, questions or concerns.  Medical decision making: ***  Time spent on visit: I spent {CHL ONC TIME VISIT - WLNLG:[id number]} counseling the patient face to face. The total time spent in the appointment was {CHL ONC TIME VISIT - CXKGY:[id number]} and more than 50% was on counseling.   Harriett Rush, PA-C  ***

## 2022-03-08 ENCOUNTER — Inpatient Hospital Stay (HOSPITAL_COMMUNITY): Payer: Medicare Other | Attending: Physician Assistant | Admitting: Physician Assistant

## 2022-03-08 VITALS — Wt 209.9 lb

## 2022-03-08 DIAGNOSIS — D696 Thrombocytopenia, unspecified: Secondary | ICD-10-CM | POA: Insufficient documentation

## 2022-03-08 DIAGNOSIS — R7989 Other specified abnormal findings of blood chemistry: Secondary | ICD-10-CM | POA: Diagnosis not present

## 2022-03-08 DIAGNOSIS — Z87891 Personal history of nicotine dependence: Secondary | ICD-10-CM | POA: Insufficient documentation

## 2022-03-08 NOTE — Patient Instructions (Addendum)
[place] at [facility] Discharge Instructions  You were seen today by Tarri Abernethy PA-C for your low platelets, which remain mildly low but stable.  Some of your vitamin and mineral levels are slightly lower than normal, so you should start taking Centrum Silver or similar adult multivitamin once daily.  LABS: Return in 6 months for labs   FOLLOW-UP APPOINTMENT: Office visit in 6 months   Thank you for choosing [place] at [facility] to provide your oncology and hematology care.  To afford each patient quality time with our provider, please arrive at least 15 minutes before your scheduled appointment time.   If you have a lab appointment with the [facility] please come in thru the Main Entrance and check in at the main information desk.  You need to re-schedule your appointment should you arrive 10 or more minutes late.  We strive to give you quality time with our providers, and arriving late affects you and other patients whose appointments are after yours.  Also, if you no show three or more times for appointments you may be dismissed from the clinic at the providers discretion.     Again, thank you for choosing [facility].  Our hope is that these requests will decrease the amount of time that you wait before being seen by our physicians.       _____________________________________________________________  Should you have questions after your visit to [facility], please contact our office at [phone] and follow the prompts.  Our office hours are 8:00 a.m. and 4:30 p.m. Monday - Friday.  Please note that voicemails left after 4:00 p.m. may not be returned until the following business day.  We are closed weekends and major holidays.  You do have access to a nurse 24-7, just call the main number to the clinic [phone] and do not press any options, hold on the line and a nurse will answer the phone.     For prescription refill requests, have your pharmacy contact our office and allow 72 hours.    Due to Covid, you will need to wear a mask upon entering the hospital. If you do not have a mask, a mask will be given to you at the Main Entrance upon arrival. For doctor visits, patients may have 1 support person age 1 or older with them. For treatment visits, patients can not have anyone with them due to social distancing guidelines and our immunocompromised population.

## 2022-03-27 NOTE — Progress Notes (Unsigned)
Cardiology Office Note    Date:  03/28/2022   ID:  Jerry, Roach 1945/11/08, MRN [id number]  PCP:  Redmond School, MD  Cardiologist: Carlyle Dolly, MD    Chief Complaint  Patient presents with   Follow-up    Overdue Visit    History of Present Illness:    Jerry Roach is a 76 y.o. male with past medical history of CAD (s/p NSTEMI in 12/2017 with DES to proximal LAD and noted to have CTO of RCA with L--> R collaterals), aortic stenosis, thrombocytopenia and HLD who presents to the office today for overdue follow-up.   He was last examined by Dr. Harl Bowie in 02/2021 and denied any recent anginal symptoms at that time. He was scheduled for a repeat echo for surveillance of his AS with plans to follow-up in 6 months. This showed a preserved EF of 60-65% with no regional WMA. He did have mild LVH and his AS was in a severe range with mean gradient of 39 mmHg.   In talking with the patient today, he reports he has noticed more dyspnea on exertion over the past year but this is typically worse after he consumes a meal. 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 He denies any specific exertional chest pain or palpitations. No recent orthopnea, PND or pitting edema. No recent dizziness or presyncope.  Past Medical History:  Diagnosis Date   CAD (coronary artery disease)    a. 12/2017: s/p NSTEMI with DES to Proximal LAD. CTO of RCA with L--> R collaterals noted.    Hypercholesteremia    Nocturia    Weak urinary stream     Past Surgical History:  Procedure Laterality Date   CATARACT EXTRACTION W/PHACO Left 03/03/2015   Procedure: CATARACT EXTRACTION PHACO AND INTRAOCULAR LENS PLACEMENT (IOC);  Surgeon: Baruch Goldmann, MD;  Location: [facility];  Service: Ophthalmology;  Laterality: Left;  CDE 6.86   CATARACT EXTRACTION W/PHACO Right 08/31/2015   Procedure: CATARACT EXTRACTION PHACO AND INTRAOCULAR LENS PLACEMENT RIGHT  EYE CDE=4.08;  Surgeon: Baruch Goldmann, MD;  Location: [facility];  Service: Ophthalmology;  Laterality: Right;   COLONOSCOPY  11/05/2011   Procedure: COLONOSCOPY;  Surgeon: Daneil Dolin, MD;  Location: [facility];  Service: Endoscopy;  Laterality: N/A;  7:30 AM   CORONARY STENT INTERVENTION N/A 12/27/2017   Procedure: CORONARY STENT INTERVENTION;  Surgeon: Nelva Bush, MD;  Location: [facility];  Service: Cardiovascular;  Laterality: N/A;   LEFT HEART CATH AND CORONARY ANGIOGRAPHY N/A 12/27/2017   Procedure: LEFT HEART CATH AND CORONARY ANGIOGRAPHY;  Surgeon: Nelva Bush, MD;  Location: [facility];  Service: Cardiovascular;  Laterality: N/A;   NO PAST SURGERIES      Current Medications: Outpatient Medications Prior to Visit  Medication Sig Dispense Refill   acetaminophen (TYLENOL) 325 MG tablet Take 2 tablets (650 mg total) by mouth every 6 (six) hours as needed for mild pain or headache.     alfuzosin (UROXATRAL) 10 MG 24 hr tablet Take 1 tablet (10 mg total) by mouth daily with breakfast. 90 tablet 3   aspirin EC 81 MG EC tablet Take 1 tablet (81 mg total) by mouth daily.     atorvastatin (LIPITOR) 80 MG tablet TAKE 1 TABLET(80 MG) BY MOUTH DAILY AT 6 PM 30 tablet 6   mometasone (NASONEX) 50 MCG/ACT nasal spray 2 sprays daily.     nitroGLYCERIN (  NITROSTAT) 0.4 MG SL tablet Place 1 tablet (0.4 mg total) under the tongue every 5 (five) minutes x 3 doses as needed for chest pain. 25 tablet 3   No facility-administered medications prior to visit.     Allergies:   Patient has no known allergies.   Social History   Socioeconomic History   Marital status: Married    Spouse name: Not on file   Number of children: Not on file   Years of education: Not on file   Highest education level: Not on file  Occupational History   Not on file  Tobacco Use   Smoking status: Former    Packs/day: 1.50    Years: 39.00    Total pack years: 58.50    Types: Cigarettes    Quit  date: 12/26/2017    Years since quitting: 4.2   Smokeless tobacco: Never  Vaping Use   Vaping Use: Never used  Substance and Sexual Activity   Alcohol use: No   Drug use: No   Sexual activity: Yes    Birth control/protection: None  Other Topics Concern   Not on file  Social History Narrative   Not on file   Social Determinants of Health   Financial Resource Strain: Low Risk  (01/26/2021)   Overall Financial Resource Strain (CARDIA)    Difficulty of Paying Living Expenses: Not hard at all  Food Insecurity: No Food Insecurity (01/26/2021)   Hunger Vital Sign    Worried About Running Out of Food in the Last Year: Never true    Ran Out of Food in the Last Year: Never true  Transportation Needs: No Transportation Needs (01/26/2021)   PRAPARE - Hydrologist (Medical): No    Lack of Transportation (Non-Medical): No  Physical Activity: Sufficiently Active (01/26/2021)   Exercise Vital Sign    Days of Exercise per Week: 5 days    Minutes of Exercise per Session: 50 min  Stress: No Stress Concern Present (01/26/2021)   [place]    Feeling of Stress : Not at all  Social Connections: [place] (01/26/2021)   Social Connection and Isolation Panel [NHANES]    Frequency of Communication with Friends and Family: More than three times a week    Frequency of Social Gatherings with Friends and Family: More than three times a week    Attends Religious Services: 1 to 4 times per year    Active Member of [employer] or Organizations: No    Attends Music therapist: 1 to 4 times per year    Marital Status: Married     Family History:  The patient's family history includes COPD in his brother, mother, and sister; Hypertension in his father and sister.   Review of Systems:    Please see the history of present illness.     All other systems reviewed and are otherwise negative except as  noted above.   Physical Exam:    VS:  BP (!) 110/58   Pulse 74   Ht 5\' 11"  (1.803 m)   Wt 207 lb 12.8 oz (94.3 kg)   SpO2 97%   BMI 28.98 kg/m    General: Well developed, well nourished,male appearing in no acute distress. Head: Normocephalic, atraumatic. Neck: No carotid bruits. JVD not elevated.  Lungs: Respirations regular and unlabored, without wheezes or rales.  Heart: Regular rate and rhythm. No S3 or S4. 3/6 SEM along RUSB.  Abdomen: Appears non-distended. No obvious abdominal masses. Msk:  Strength and tone appear normal for age. No obvious joint deformities or effusions. Extremities: No clubbing or cyanosis. No pitting edema.  Distal pedal pulses are 2+ bilaterally. Neuro: Alert and oriented X 3. Moves all extremities spontaneously. No focal deficits noted. Psych:  Responds to questions appropriately with a normal affect. Skin: No rashes or lesions noted  Wt Readings from Last 3 Encounters:  03/28/22 207 lb 12.8 oz (94.3 kg)  03/08/22 209 lb 14.1 oz (95.2 kg)  08/18/21 201 lb (91.2 kg)     Studies/Labs Reviewed:   EKG:  EKG is ordered today. The ekg ordered today demonstrates NSR, HR 73 with 1st degree AV block and PVC's. TWI along lateral leads more prominent when compared to prior tracings.   Recent Labs: 02/26/2022: ALT 16; BUN 14; Creatinine, Ser 1.25; Hemoglobin 13.8; Platelets 135; Potassium 4.0; Sodium 138   Lipid Panel    Component Value Date/Time   CHOL 113 08/02/2020 1340   TRIG 56 08/02/2020 1340   HDL 43 08/02/2020 1340   CHOLHDL 2.6 08/02/2020 1340   VLDL 11 08/02/2020 1340   LDLCALC 59 08/02/2020 1340    Additional studies/ records that were reviewed today include:   LHC: 12/2017 Conclusions: Severe 2-vessel coronary artery disease, including sequential 80% and 40% proximal and mid LAD stenoses, as well as 50% ostial and 100% distal RCA lesions. Moderate disease involving LCx and dominant OM branch. Collateralization of distal RCA branches  and appearance of thrombus in the distal RCA suggests subacute thrombosis. Mildly elevated left ventricular filling pressure. Moderately elevated LVOT gradient, likely due to aortic stenosis. Basal and mid inferior hypokinesis with otherwise preserved left ventricular contraction. Successful PCI to the proximal LAD using Resolute Onyx 3.0 x 18 mm drug-eluting stent with 0% residual stenosis and TIMI-3 flow.   Recommendations: Medical therapy for moderate LCx disease and occluded distal RCA with left-to-right collaterals. Dual antiplatelet therapy with aspirin and ticagrelor for at least 12 months. Aggressive secondary prevention. Obtain echocardiogram for further evaluation of suspect aortic stenosis.  Echo: 03/2021 IMPRESSIONS     1. Left ventricular ejection fraction, by estimation, is 60 to 65%. The  left ventricle has normal function. The left ventricle has no regional  wall motion abnormalities. There is mild left ventricular hypertrophy.  Left ventricular diastolic parameters  are indeterminate.   2. Right ventricular systolic function is normal. The right ventricular  size is normal. Tricuspid regurgitation signal is inadequate for assessing  PA pressure.   3. Left atrial size was upper normal.   4. The mitral valve is degenerative. Trivial mitral valve regurgitation.  Moderate mitral annular calcification.   5. The aortic valve has an indeterminant number of cusps. There is severe  calcifcation of the aortic valve. Aortic valve regurgitation is mild to  moderate. Severe aortic valve stenosis. Aortic regurgitation PHT measures  373 msec. Aortic valve mean  gradient measures 39.0 mmHg. Dimentionless index 0.24.   6. The inferior vena cava is normal in size with greater than 50%  respiratory variability, suggesting right atrial pressure of 3 mmHg.   Assessment:    1. Coronary artery disease involving native coronary artery of native heart without angina pectoris   2.  Nonrheumatic aortic valve stenosis   3. Hyperlipidemia LDL goal <70   4. Thrombocytopenia ([place])      Plan:   In order of problems listed above:  1. CAD - He is s/p NSTEMI in 12/2017 with DES  to proximal LAD and noted to have CTO of RCA with L--> R collaterals. He reports dyspnea on exertion but denies any exertional chest pain. - Would anticipate repeat R/LHC pending echocardiogram results as part of the work-up for his AS.  - Continue ASA 81mg  daily and Atorvastatin 80mg  daily. He has not been on beta-blocker therapy given prior bradycardia and hypotension.   2. Aortic Stenosis - He had severe aortic stenosis by echocardiogram last year with mean gradient of 39 mmHg. He does report more dyspnea on exertion but denies any dizziness or presyncope. 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 Anticipate he would be a good TAVR candidate. Will plan to obtain a follow-up echocardiogram given that his most recent imaging was a year ago. Based off results, would anticipate arranging for [facility] with Dr. Burt Knack or Dr. Angelena Form but will reach out to the Structural Heart team in regards to timing for this since he wishes to delay surgery until later this year. We reviewed that if his symptoms progress in the interim, he would need to undergo valve replacement sooner.   3. HLD - Followed by his PCP. Will request a copy of most recent labs.  He remains on Atorvastatin 80 mg daily.  4. Thrombocytopenia - Followed by Hematology. He denies any evidence of active bleeding. Platelet count was stable at 135 K when checked in 02/2022.   Medication Adjustments/Labs and Tests Ordered: Current medicines are reviewed at length with the patient today.  Concerns regarding medicines are outlined above.  Medication changes, Labs and Tests ordered today are listed in the Patient Instructions below. Patient Instructions  Medication Instructions:  Your physician  recommends that you continue on your current medications as directed. Please refer to the Current Medication list given to you today.   Labwork: None  Testing/Procedures: Your physician has requested that you have an echocardiogram. Echocardiography is a painless test that uses sound waves to create images of your heart. It provides your doctor with information about the size and shape of your heart and how well your heart's chambers and valves are working. This procedure takes approximately one hour. There are no restrictions for this procedure.   Follow-Up: Follow up with Dr. Harl Bowie in 3 months.  Any Other Special Instructions Will Be Listed Below (If Applicable).     If you need a refill on your cardiac medications before your next appointment, please call your pharmacy.    Signed, Erma Heritage, PA-C  03/28/2022 4:55 PM    [facility] [address] [place], [place] [address] Phone: [phone] Fax: [phone]

## 2022-03-28 ENCOUNTER — Telehealth: Payer: Self-pay | Admitting: Student

## 2022-03-28 ENCOUNTER — Ambulatory Visit (INDEPENDENT_AMBULATORY_CARE_PROVIDER_SITE_OTHER): Payer: Medicare Other | Admitting: Student

## 2022-03-28 ENCOUNTER — Encounter: Payer: Self-pay | Admitting: Student

## 2022-03-28 VITALS — BP 110/58 | HR 74 | Ht 71.0 in | Wt 207.8 lb

## 2022-03-28 DIAGNOSIS — E785 Hyperlipidemia, unspecified: Secondary | ICD-10-CM | POA: Diagnosis not present

## 2022-03-28 DIAGNOSIS — D696 Thrombocytopenia, unspecified: Secondary | ICD-10-CM

## 2022-03-28 DIAGNOSIS — I2510 Atherosclerotic heart disease of native coronary artery without angina pectoris: Secondary | ICD-10-CM | POA: Diagnosis not present

## 2022-03-28 DIAGNOSIS — I350 Nonrheumatic aortic (valve) stenosis: Secondary | ICD-10-CM

## 2022-03-28 NOTE — Telephone Encounter (Signed)
Checking percert on the following patient for testing scheduled at [facility].     ECHO - 04/06/2022

## 2022-03-28 NOTE — Patient Instructions (Signed)
Medication Instructions:  Your physician recommends that you continue on your current medications as directed. Please refer to the Current Medication list given to you today.   Labwork: None  Testing/Procedures: Your physician has requested that you have an echocardiogram. Echocardiography is a painless test that uses sound waves to create images of your heart. It provides your doctor with information about the size and shape of your heart and how well your heart's chambers and valves are working. This procedure takes approximately one hour. There are no restrictions for this procedure.   Follow-Up: Follow up with Dr. Harl Bowie in 3 months.  Any Other Special Instructions Will Be Listed Below (If Applicable).     If you need a refill on your cardiac medications before your next appointment, please call your pharmacy.

## 2022-04-06 ENCOUNTER — Ambulatory Visit (HOSPITAL_COMMUNITY)
Admission: RE | Admit: 2022-04-06 | Discharge: 2022-04-06 | Disposition: A | Discharge status: Home / Self Care | Payer: Medicare Other | Source: Ambulatory Visit | Attending: Student | Admitting: Student

## 2022-04-06 DIAGNOSIS — I350 Nonrheumatic aortic (valve) stenosis: Secondary | ICD-10-CM | POA: Diagnosis not present

## 2022-04-06 LAB — ECHOCARDIOGRAM COMPLETE
AR max vel: 0.39 cm2
AV Area VTI: 0.41 cm2
AV Area mean vel: 0.39 cm2
AV Mean grad: 44 mmHg
AV Peak grad: 68 mmHg
Ao pk vel: 4.12 m/s
Area-P 1/2: 2.42 cm2
P 1/2 time: 510 msec
S' Lateral: 3 cm

## 2022-04-06 NOTE — Progress Notes (Signed)
*  PRELIMINARY RESULTS* Echocardiogram 2D Echocardiogram has been performed.  Samuel Germany 04/06/2022, 3:45 PM

## 2022-04-09 ENCOUNTER — Ambulatory Visit (INDEPENDENT_AMBULATORY_CARE_PROVIDER_SITE_OTHER): Payer: Medicare Other | Admitting: Internal Medicine

## 2022-04-09 ENCOUNTER — Encounter: Payer: Self-pay | Admitting: Internal Medicine

## 2022-04-09 VITALS — BP 112/60 | HR 78 | Ht 70.0 in | Wt 207.0 lb

## 2022-04-09 DIAGNOSIS — I350 Nonrheumatic aortic (valve) stenosis: Secondary | ICD-10-CM

## 2022-04-09 DIAGNOSIS — I2510 Atherosclerotic heart disease of native coronary artery without angina pectoris: Secondary | ICD-10-CM | POA: Diagnosis not present

## 2022-04-09 DIAGNOSIS — D696 Thrombocytopenia, unspecified: Secondary | ICD-10-CM

## 2022-04-09 DIAGNOSIS — E785 Hyperlipidemia, unspecified: Secondary | ICD-10-CM | POA: Diagnosis not present

## 2022-04-09 NOTE — Patient Instructions (Signed)
Medication Instructions:  Your physician recommends that you continue on your current medications as directed. Please refer to the Current Medication list given to you today.  *If you need a refill on your cardiac medications before your next appointment, please call your pharmacy*  Follow-Up: At [facility], you and your health needs are our priority.  As part of our continuing mission to provide you with exceptional heart care, we have created designated Provider Care Teams.  These Care Teams include your primary Cardiologist (physician) and Advanced Practice Providers (APPs -  Physician Assistants and Nurse Practitioners) who all work together to provide you with the care you need, when you need it.  We recommend signing up for the patient portal called "MyChart".  Sign up information is provided on this After Visit Summary.  MyChart is used to connect with patients for Virtual Visits (Telemedicine).  Patients are able to view lab/test results, encounter notes, upcoming appointments, etc.  Non-urgent messages can be sent to your provider as well.   To learn more about what you can do with MyChart, go to [email or web address].    Your next appointment:   3 month(s)  The format for your next appointment:   In Person  Provider:   Carlyle Dolly, MD {  Important Information About Sugar

## 2022-04-09 NOTE — Progress Notes (Signed)
Patient ID: Jerry Roach MRN: [id number] DOB/AGE: 10-15-1945 76 y.o.  Primary Care Physician:Fusco, Purcell Nails, MD Primary Cardiologist: Carlyle Dolly, MD   FOCUSED CARDIOVASCULAR PROBLEM LIST:   1.  Severe aortic stenosis with an aortic valve area of 0.41 cm, mean gradient 44 mmHg, and a peak velocity of 4 m/s with ejection fraction of 60 to 65%; no conduction abnormalities 2.  Coronary artery disease status post PCI of proximal LAD 2019 with known chronic total occlusion of distal right coronary 3.  Hypertension 4.  Hyperlipidemia 5.  Prior tobacco abuse 6.  Mild thrombocytopenia followed by hematology; on chronic aspirin monotherapy    HISTORY OF PRESENT ILLNESS: The patient is a 76 y.o. male with the indicated medical history here for recommendations regarding his severe symptomatic aortic stenosis.  He was recently seen by PA Strader and then endorsed some increasing dyspnea on exertion.  The patient is here with his wife.  He continues to farm.  He tells me he may be a little bit more short of breath but otherwise can work a 12-hour day without any issues.  He denies any exertional angina.  Has had no presyncope or syncope.  He denies any paroxysmal nocturnal dyspnea, orthopnea, or symptomatic palpitations.  He feels very well and is able to do everything he needs to do in a day.  Compared to last year he is may be a tad bit slower but this does not really bother him too much.  He sees a Pharmacist, community on a regular basis and needs to have a tooth extracted he tells me.  He does not smoke.  Past Medical History:  Diagnosis Date   CAD (coronary artery disease)    a. 12/2017: s/p NSTEMI with DES to Proximal LAD. CTO of RCA with L--> R collaterals noted.    Hypercholesteremia    Nocturia    Weak urinary stream     Past Surgical History:  Procedure Laterality Date   CATARACT EXTRACTION W/PHACO Left 03/03/2015   Procedure: CATARACT EXTRACTION PHACO AND INTRAOCULAR LENS PLACEMENT  (IOC);  Surgeon: Baruch Goldmann, MD;  Location: [facility];  Service: Ophthalmology;  Laterality: Left;  CDE 6.86   CATARACT EXTRACTION W/PHACO Right 08/31/2015   Procedure: CATARACT EXTRACTION PHACO AND INTRAOCULAR LENS PLACEMENT RIGHT EYE CDE=4.08;  Surgeon: Baruch Goldmann, MD;  Location: [facility];  Service: Ophthalmology;  Laterality: Right;   COLONOSCOPY  11/05/2011   Procedure: COLONOSCOPY;  Surgeon: Daneil Dolin, MD;  [facility];  Service: Endoscopy;  Laterality: N/A;  7:30 AM   CORONARY STENT INTERVENTION N/A 12/27/2017   Procedure: CORONARY STENT INTERVENTION;  Surgeon: Nelva Bush, MD;  Location: [facility];  Service: Cardiovascular;  Laterality: N/A;   LEFT HEART CATH AND CORONARY ANGIOGRAPHY N/A 12/27/2017   Procedure: LEFT HEART CATH AND CORONARY ANGIOGRAPHY;  Surgeon: Nelva Bush, MD;  Location: [facility];  Service: Cardiovascular;  Laterality: N/A;   NO PAST SURGERIES      Family History  Problem Relation Age of Onset   Hypertension Father    COPD Mother    Hypertension Sister    COPD Sister    COPD Brother    Colon cancer Neg Hx     Social History   Socioeconomic History   Marital status: Married    Spouse name: Not on file   Number of children: Not on file   Years of education: Not on file   Highest education level: Not on file  Occupational History   Not on file  Tobacco Use   Smoking status: Former    Packs/day: 1.50    Years: 39.00    Total pack years: 58.50    Types: Cigarettes    Quit date: 12/26/2017    Years since quitting: 4.2   Smokeless tobacco: Never  Vaping Use   Vaping Use: Never used  Substance and Sexual Activity   Alcohol use: No   Drug use: No   Sexual activity: Yes    Birth control/protection: None  Other Topics Concern   Not on file  Social History Narrative   Not on file   Social Determinants of Health   Financial Resource Strain: Low Risk  (01/26/2021)   Overall Financial Resource Strain (CARDIA)     Difficulty of Paying Living Expenses: Not hard at all  Food Insecurity: No Food Insecurity (01/26/2021)   Hunger Vital Sign    Worried About Running Out of Food in the Last Year: Never true    Ran Out of Food in the Last Year: Never true  Transportation Needs: No Transportation Needs (01/26/2021)   PRAPARE - Hydrologist (Medical): No    Lack of Transportation (Non-Medical): No  Physical Activity: Sufficiently Active (01/26/2021)   Exercise Vital Sign    Days of Exercise per Week: 5 days    Minutes of Exercise per Session: 50 min  Stress: No Stress Concern Present (01/26/2021)   [place]    Feeling of Stress : Not at all  Social Connections: [place] (01/26/2021)   Social Connection and Isolation Panel [NHANES]    Frequency of Communication with Friends and Family: More than three times a week    Frequency of Social Gatherings with Friends and Family: More than three times a week    Attends Religious Services: 1 to 4 times per year    Active Member of [employer] or Organizations: No    Attends Archivist Meetings: 1 to 4 times per year    Marital Status: Married  Human resources officer Violence: Not At Risk (01/26/2021)   Humiliation, Afraid, Rape, and Kick questionnaire    Fear of Current or Ex-Partner: No    Emotionally Abused: No    Physically Abused: No    Sexually Abused: No     Prior to Admission medications   Medication Sig Start Date End Date Taking? Authorizing Provider  acetaminophen (TYLENOL) 325 MG tablet Take 2 tablets (650 mg total) by mouth every 6 (six) hours as needed for mild pain or headache. 12/29/17   Erlene Quan, PA-C  alfuzosin (UROXATRAL) 10 MG 24 hr tablet Take 1 tablet (10 mg total) by mouth daily with breakfast. 12/27/21   Cleon Gustin, MD  aspirin EC 81 MG EC tablet Take 1 tablet (81 mg total) by mouth daily. 12/30/17   Erlene Quan, PA-C   atorvastatin (LIPITOR) 80 MG tablet TAKE 1 TABLET(80 MG) BY MOUTH DAILY AT 6 PM 01/19/22   Arnoldo Lenis, MD  mometasone (NASONEX) 50 MCG/ACT nasal spray 2 sprays daily. 12/02/19   [provider]  nitroGLYCERIN (NITROSTAT) 0.4 MG SL tablet Place 1 tablet (0.4 mg total) under the tongue every 5 (five) minutes x 3 doses as needed for chest pain. 12/29/17   Kroeger, Lorelee Cover., PA-C    No Known Allergies  REVIEW OF SYSTEMS:  General: no fevers/chills/night sweats Eyes: no blurry vision, diplopia, or amaurosis ENT:  no sore throat or hearing loss Resp: no cough, wheezing, or hemoptysis CV: no edema or palpitations GI: no abdominal pain, nausea, vomiting, diarrhea, or constipation GU: no dysuria, frequency, or hematuria Skin: no rash Neuro: no headache, numbness, tingling, or weakness of extremities Musculoskeletal: no joint pain or swelling Heme: no bleeding, DVT, or easy bruising Endo: no polydipsia or polyuria  BP 112/60 (BP Location: Right Arm, Patient Position: Sitting, Cuff Size: Normal)   Pulse 78   Ht 5\' 10"  (1.778 m)   Wt 207 lb (93.9 kg)   BMI 29.70 kg/m   PHYSICAL EXAM: GEN:  AO x 3 in no acute distress HEENT: normal Dentition:  Some dental erosion is present Neck: JVP normal. + 1carotid upstrokes without bruits. No thyromegaly. Lungs: equal expansion, clear bilaterally CV: Apex is discrete and nondisplaced, RRR with 3/5 SEM Abd: soft, non-tender, non-distended; no bruit; positive bowel sounds Ext: no edema, ecchymoses, or cyanosis Vascular: 2+ femoral pulses, 2+ radial pulses       Skin: warm and dry without rash Neuro: CN II-XII grossly intact; motor and sensory grossly intact    DATA AND STUDIES:  EKG: June 2023 Sinus rhythm with occasional PVCs  2D ECHO: June 2023: Severe aortic stenosis with a mean gradient of 40 mmHg and aortic valve area of 0.41 cm with a normal ejection fraction and no significant other valvular abnormalities  CARDIAC CATH:  2019: Moderate obstructive disease of the mid right coronary artery with a distal chronic total occlusion collateralized by the left system; high-grade proximal LAD lesion treated with 1 drug-eluting stent  STS RISK CALCULATOR: Pending  NHYA CLASS: 1    ASSESSMENT AND PLAN:   Nonrheumatic aortic valve stenosis  Coronary artery disease involving native coronary artery of native heart without angina pectoris  Thrombocytopenia (HCC)  Hyperlipidemia LDL goal <70  The patient has developed severe mildly symptomatic aortic stenosis.  Had a long conversation with the patient about potential treatment options and what to expect in the future.  It is clear that he is not yet to symptomatic where he would want to consider an intervention.  We will have him follow-up with either me or our structural heart APP team in 3 months to help surveilled him.  I did asked the patient to follow-up with his dentist to make sure that the tooth that is needed to be extracted is done so well before an aortic valve intervention is required.  I did tell him that I expect that he will become more symptomatic within the next few months.  He knows to watch for increasing dyspnea, chest pain, presyncope or syncope.  If any of these symptoms develop he will contact us.  I have personally reviewed the patients imaging data as summarized above.  I have reviewed the natural history of aortic stenosis with the patient and family members who are present today. We have discussed the limitations of medical therapy and the poor prognosis associated with symptomatic aortic stenosis. We have also reviewed potential treatment options, including palliative medical therapy, conventional surgical aortic valve replacement, and transcatheter aortic valve replacement. We discussed treatment options in the context of this patient's specific comorbid medical conditions.   All of the patient's questions were answered today. Will make further  recommendations based on the results of studies outlined above.   Total time spent with patient today 60 minutes. This includes reviewing records, evaluating the patient and coordinating care.   Early Osmond, MD  04/09/2022 3:52 PM  [facility] [address], [place], [place]  [address] Phone: [phone]; Fax: [phone]

## 2022-04-11 ENCOUNTER — Telehealth: Payer: Self-pay | Admitting: Internal Medicine

## 2022-04-11 MED ORDER — NITROGLYCERIN 0.4 MG SL SUBL: 0.4000 mg | SUBLINGUAL_TABLET | SUBLINGUAL | 3 refills | Status: DC | PRN

## 2022-04-11 NOTE — Telephone Encounter (Signed)
Completed.

## 2022-04-11 NOTE — Telephone Encounter (Signed)
*  STAT* If patient is at the pharmacy, call can be transferred to refill team.   1. Which medications need to be refilled? (please list name of each medication and dose if known) nitroGLYCERIN (NITROSTAT) 0.4 MG SL tablet  2. Which pharmacy/location (including street and city if local pharmacy) is medication to be sent to? [facility]  3. Do they need a 30 day or 90 day supply? [place]

## 2022-07-09 ENCOUNTER — Encounter: Payer: Self-pay | Admitting: Internal Medicine

## 2022-07-09 ENCOUNTER — Telehealth: Payer: Self-pay | Admitting: Physician Assistant

## 2022-07-09 ENCOUNTER — Ambulatory Visit: Payer: Medicare Other | Attending: Internal Medicine | Admitting: Internal Medicine

## 2022-07-09 DIAGNOSIS — I2510 Atherosclerotic heart disease of native coronary artery without angina pectoris: Secondary | ICD-10-CM

## 2022-07-09 DIAGNOSIS — D696 Thrombocytopenia, unspecified: Secondary | ICD-10-CM

## 2022-07-09 DIAGNOSIS — E785 Hyperlipidemia, unspecified: Secondary | ICD-10-CM

## 2022-07-09 DIAGNOSIS — I350 Nonrheumatic aortic (valve) stenosis: Secondary | ICD-10-CM

## 2022-07-09 NOTE — Progress Notes (Signed)
Patient ID: Jerry Roach MRN: [id number] DOB/AGE: [birth year] 76 y.o.  Primary Care Physician:Fusco, Purcell Nails, MD Primary Cardiologist: Carlyle Dolly, MD  PATIENT DID NOT APPEAR FOR APPOINTMENT   FOCUSED CARDIOVASCULAR PROBLEM LIST:   1.  Severe aortic stenosis with an aortic valve area of 0.41 cm, mean gradient 44 mmHg, and a peak velocity of 4 m/s with ejection fraction of 60 to 65%; no conduction abnormalities 2.  Coronary artery disease status post PCI of proximal LAD 2019 with known chronic total occlusion of distal right coronary 3.  Hypertension 4.  Hyperlipidemia 5.  Prior tobacco abuse 6.  Mild thrombocytopenia followed by hematology; on chronic aspirin monotherapy  HISTORY OF PRESENT ILLNESS: The patient returns for 75-month follow-up.  When I saw him in July despite severe aortic stenosis he was feeling very well and I will plan to follow-up in 3 months or earlier if needed was devised.  Past Medical History:  Diagnosis Date   CAD (coronary artery disease)    a. 12/2017: s/p NSTEMI with DES to Proximal LAD. CTO of RCA with L--> R collaterals noted.    Hypercholesteremia    Nocturia    Weak urinary stream     Past Surgical History:  Procedure Laterality Date   CATARACT EXTRACTION W/PHACO Left 03/03/2015   Procedure: CATARACT EXTRACTION PHACO AND INTRAOCULAR LENS PLACEMENT (IOC);  Surgeon: Baruch Goldmann, MD;  Location: [facility];  Service: Ophthalmology;  Laterality: Left;  CDE 6.86   CATARACT EXTRACTION W/PHACO Right 08/31/2015   Procedure: CATARACT EXTRACTION PHACO AND INTRAOCULAR LENS PLACEMENT RIGHT EYE CDE=4.08;  Surgeon: Baruch Goldmann, MD;  Location: [facility];  Service: Ophthalmology;  Laterality: Right;   COLONOSCOPY  11/05/2011   Procedure: COLONOSCOPY;  Surgeon: Daneil Dolin, MD;  [facility];  Service: Endoscopy;  Laterality: N/A;  7:30 AM   CORONARY STENT INTERVENTION N/A 12/27/2017   Procedure: CORONARY STENT INTERVENTION;  Surgeon: Nelva Bush, MD;  Location: [facility];  Service: Cardiovascular;  Laterality: N/A;   LEFT HEART CATH AND CORONARY ANGIOGRAPHY N/A 12/27/2017   Procedure: LEFT HEART CATH AND CORONARY ANGIOGRAPHY;  Surgeon: Nelva Bush, MD;  Location: [facility];  Service: Cardiovascular;  Laterality: N/A;   NO PAST SURGERIES      Family History  Problem Relation Age of Onset   Hypertension Father    COPD Mother    Hypertension Sister    COPD Sister    COPD Brother    Colon cancer Neg Hx     Social History   Socioeconomic History   Marital status: Married    Spouse name: Not on file   Number of children: Not on file   Years of education: Not on file   Highest education level: Not on file  Occupational History   Not on file  Tobacco Use   Smoking status: Former    Packs/day: 1.50    Years: 39.00    Total pack years: 58.50    Types: Cigarettes    Quit date: 12/26/2017    Years since quitting: 4.5   Smokeless tobacco: Never  Vaping Use   Vaping Use: Never used  Substance and Sexual Activity   Alcohol use: No   Drug use: No   Sexual activity: Yes    Birth control/protection: None  Other Topics Concern   Not on file  Social History Narrative   Not on file   Social Determinants of Health   Financial Resource Strain: Low  Risk  (01/26/2021)   Overall Financial Resource Strain (CARDIA)    Difficulty of Paying Living Expenses: Not hard at all  Food Insecurity: No Food Insecurity (01/26/2021)   Hunger Vital Sign    Worried About Running Out of Food in the Last Year: Never true    Ran Out of Food in the Last Year: Never true  Transportation Needs: No Transportation Needs (01/26/2021)   PRAPARE - Hydrologist (Medical): No    Lack of Transportation (Non-Medical): No  Physical Activity: Sufficiently Active (01/26/2021)   Exercise Vital Sign    Days of Exercise per Week: 5 days    Minutes of Exercise per Session: 50 min  Stress: No Stress  Concern Present (01/26/2021)   [place]    Feeling of Stress : Not at all  Social Connections: [place] (01/26/2021)   Social Connection and Isolation Panel [NHANES]    Frequency of Communication with Friends and Family: More than three times a week    Frequency of Social Gatherings with Friends and Family: More than three times a week    Attends Religious Services: 1 to 4 times per year    Active Member of [employer] or Organizations: No    Attends Archivist Meetings: 1 to 4 times per year    Marital Status: Married  Human resources officer Violence: Not At Risk (01/26/2021)   Humiliation, Afraid, Rape, and Kick questionnaire    Fear of Current or Ex-Partner: No    Emotionally Abused: No    Physically Abused: No    Sexually Abused: No     Prior to Admission medications   Medication Sig Start Date End Date Taking? Authorizing Provider  acetaminophen (TYLENOL) 325 MG tablet Take 2 tablets (650 mg total) by mouth every 6 (six) hours as needed for mild pain or headache. 12/29/17   Erlene Quan, PA-C  alfuzosin (UROXATRAL) 10 MG 24 hr tablet Take 1 tablet (10 mg total) by mouth daily with breakfast. 12/27/21   Cleon Gustin, MD  aspirin EC 81 MG EC tablet Take 1 tablet (81 mg total) by mouth daily. 12/30/17   Erlene Quan, PA-C  atorvastatin (LIPITOR) 80 MG tablet TAKE 1 TABLET(80 MG) BY MOUTH DAILY AT 6 PM 01/19/22   Arnoldo Lenis, MD  mometasone (NASONEX) 50 MCG/ACT nasal spray 2 sprays daily. 12/02/19   [provider]  nitroGLYCERIN (NITROSTAT) 0.4 MG SL tablet Place 1 tablet (0.4 mg total) under the tongue every 5 (five) minutes x 3 doses as needed for chest pain. 12/29/17   Kroeger, Lorelee Cover., PA-C    No Known Allergies    There were no vitals taken for this visit.      DATA AND STUDIES:  EKG: June 2023 Sinus rhythm with occasional PVCs  2D ECHO: June 2023: Severe aortic stenosis  with a mean gradient of 40 mmHg and aortic valve area of 0.41 cm with a normal ejection fraction and no significant other valvular abnormalities  CARDIAC CATH: 2019: Moderate obstructive disease of the mid right coronary artery with a distal chronic total occlusion collateralized by the left system; high-grade proximal LAD lesion treated with 1 drug-eluting stent  STS RISK CALCULATOR: Pending  NHYA CLASS: 1    ASSESSMENT AND PLAN:   Nonrheumatic aortic valve stenosis  Coronary artery disease involving native coronary artery of native heart without angina pectoris  Thrombocytopenia (HCC)  Hyperlipidemia LDL goal <70  Early Osmond, MD  07/09/2022 8:37 AM    [facility], [place], [place]  [address] Phone: [phone]; Fax: [phone]

## 2022-07-09 NOTE — Telephone Encounter (Signed)
  [facility]   MULTIDISCIPLINARY HEART VALVE TEAM  Pt no showed for structural heart follow up today. 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 Apt made 10/27 @ 8am.  Angelena Form PA-C  [facility]  Pager [phone]

## 2022-08-03 ENCOUNTER — Encounter: Payer: Self-pay | Admitting: Internal Medicine

## 2022-08-03 ENCOUNTER — Ambulatory Visit: Payer: Medicare Other | Attending: Internal Medicine | Admitting: Internal Medicine

## 2022-08-03 VITALS — BP 116/60 | HR 74 | Ht 71.0 in | Wt 199.2 lb

## 2022-08-03 DIAGNOSIS — E785 Hyperlipidemia, unspecified: Secondary | ICD-10-CM

## 2022-08-03 DIAGNOSIS — I2510 Atherosclerotic heart disease of native coronary artery without angina pectoris: Secondary | ICD-10-CM | POA: Diagnosis not present

## 2022-08-03 DIAGNOSIS — D696 Thrombocytopenia, unspecified: Secondary | ICD-10-CM | POA: Insufficient documentation

## 2022-08-03 DIAGNOSIS — I350 Nonrheumatic aortic (valve) stenosis: Secondary | ICD-10-CM | POA: Diagnosis not present

## 2022-08-03 NOTE — Patient Instructions (Signed)
Medication Instructions:  Your physician recommends that you continue on your current medications as directed. Please refer to the Current Medication list given to you today.  *If you need a refill on your cardiac medications before your next appointment, please call your pharmacy*   Lab Work: none If you have labs (blood work) drawn today and your tests are completely normal, you will receive your results only by: [place] (if you have MyChart) OR A paper copy in the mail If you have any lab test that is abnormal or we need to change your treatment, we will call you to review the results.   Testing/Procedures: Your physician has requested that you have an echocardiogram. Echocardiography is a painless test that uses sound waves to create images of your heart. It provides your doctor with information about the size and shape of your heart and how well your heart's chambers and valves are working. This procedure takes approximately one hour. There are no restrictions for this procedure. Please do NOT wear cologne, perfume, aftershave, or lotions (deodorant is allowed). Please arrive 15 minutes prior to your appointment time. To be done in [place] in 3 months.   Follow-Up: At [facility], you and your health needs are our priority.  As part of our continuing mission to provide you with exceptional heart care, we have created designated Provider Care Teams.  These Care Teams include your primary Cardiologist (physician) and Advanced Practice Providers (APPs -  Physician Assistants and Nurse Practitioners) who all work together to provide you with the care you need, when you need it.  We recommend signing up for the patient portal called "MyChart".  Sign up information is provided on this After Visit Summary.  MyChart is used to connect with patients for Virtual Visits (Telemedicine).  Patients are able to view lab/test results, encounter notes, upcoming appointments, etc.   Non-urgent messages can be sent to your provider as well.   To learn more about what you can do with MyChart, go to [email or web address].    Your next appointment:   3 month(s)-- A few days after echo  The format for your next appointment:   In Person  Provider:   Early Osmond, MD     Other Instructions    Important Information About Sugar

## 2022-08-03 NOTE — Progress Notes (Signed)
Patient ID: Jerry Roach MRN: [id number] DOB/AGE: 1945-12-03 76 y.o.  Primary Care Physician:Fusco, Purcell Nails, MD Primary Cardiologist: Carlyle Dolly, MD   FOCUSED CARDIOVASCULAR PROBLEM LIST:   1.  Severe aortic stenosis with an aortic valve area of 0.41 cm, mean gradient 44 mmHg, and a peak velocity of 4 m/s with ejection fraction of 60 to 65%; no conduction abnormalities 2.  Coronary artery disease status post PCI of proximal LAD 2019 with known chronic total occlusion of distal right coronary 3.  Hypertension 4.  Hyperlipidemia 5.  Prior tobacco abuse 6.  Mild thrombocytopenia followed by hematology; on chronic aspirin monotherapy   HISTORY OF PRESENT ILLNESS:  July 2023 consultation: The patient is a 76 y.o. male with the indicated medical history here for recommendations regarding his severe symptomatic aortic stenosis.  He was recently seen by PA Strader and then endorsed some increasing dyspnea on exertion.  The patient is here with his wife.  He continues to farm.  He tells me he may be a little bit more short of breath but otherwise can work a 12-hour day without any issues.  He denies any exertional angina.  Has had no presyncope or syncope.  He denies any paroxysmal nocturnal dyspnea, orthopnea, or symptomatic palpitations.  He feels very well and is able to do everything he needs to do in a day.  Compared to last year he is may be a tad bit slower but this does not really bother him too much.  He sees a Pharmacist, community on a regular basis and needs to have a tooth extracted he tells me.  He does not smoke.  Plan:  Follow up in 3 months with echocardiogram and monitor for symptoms.  Today: The patient did not appear for his appointment earlier this month as he was busy on his farm.  He is very active on his farm.  He continues to work for 13 to 14 hours without any limitations.  He tells me sometimes when he eats a big breakfast he will get somewhat short of breath in the morning  but this lasts only an hour or so.  He denies any shortness of breath, significant chest tightness, presyncope or syncope.  He is required no emergency room visits or hospitalizations.  He is otherwise well and without significant cardiovascular complaints today.  Past Medical History:  Diagnosis Date   CAD (coronary artery disease)    a. 12/2017: s/p NSTEMI with DES to Proximal LAD. CTO of RCA with L--> R collaterals noted.    Hypercholesteremia    Nocturia    Weak urinary stream     Past Surgical History:  Procedure Laterality Date   CATARACT EXTRACTION W/PHACO Left 03/03/2015   Procedure: CATARACT EXTRACTION PHACO AND INTRAOCULAR LENS PLACEMENT (IOC);  Surgeon: Baruch Goldmann, MD;  Location: [facility];  Service: Ophthalmology;  Laterality: Left;  CDE 6.86   CATARACT EXTRACTION W/PHACO Right 08/31/2015   Procedure: CATARACT EXTRACTION PHACO AND INTRAOCULAR LENS PLACEMENT RIGHT EYE CDE=4.08;  Surgeon: Baruch Goldmann, MD;  Location: [facility];  Service: Ophthalmology;  Laterality: Right;   COLONOSCOPY  11/05/2011   Procedure: COLONOSCOPY;  Surgeon: Daneil Dolin, MD;  Location: [facility];  Service: Endoscopy;  Laterality: N/A;  7:30 AM   CORONARY STENT INTERVENTION N/A 12/27/2017   Procedure: CORONARY STENT INTERVENTION;  Surgeon: Nelva Bush, MD;  Location: [place] [facility];  Service: Cardiovascular;  Laterality: N/A;   LEFT HEART CATH AND CORONARY ANGIOGRAPHY N/A 12/27/2017  Procedure: LEFT HEART CATH AND CORONARY ANGIOGRAPHY;  Surgeon: Nelva Bush, MD;  Location: [place] [facility];  Service: Cardiovascular;  Laterality: N/A;   NO PAST SURGERIES      Family History  Problem Relation Age of Onset   Hypertension Father    COPD Mother    Hypertension Sister    COPD Sister    COPD Brother    Colon cancer Neg Hx     Social History   Socioeconomic History   Marital status: Married    Spouse name: Not on file   Number of children: Not on file   Years of education: Not on file    Highest education level: Not on file  Occupational History   Not on file  Tobacco Use   Smoking status: Former    Packs/day: 1.50    Years: 39.00    Total pack years: 58.50    Types: Cigarettes    Quit date: 12/26/2017    Years since quitting: 4.6   Smokeless tobacco: Never  Vaping Use   Vaping Use: Never used  Substance and Sexual Activity   Alcohol use: No   Drug use: No   Sexual activity: Yes    Birth control/protection: None  Other Topics Concern   Not on file  Social History Narrative   Not on file   Social Determinants of Health   Financial Resource Strain: Low Risk  (01/26/2021)   Overall Financial Resource Strain (CARDIA)    Difficulty of Paying Living Expenses: Not hard at all  Food Insecurity: No Food Insecurity (01/26/2021)   Hunger Vital Sign    Worried About Running Out of Food in the Last Year: Never true    [place] in the Last Year: Never true  Transportation Needs: No Transportation Needs (01/26/2021)   PRAPARE - Hydrologist (Medical): No    Lack of Transportation (Non-Medical): No  Physical Activity: Sufficiently Active (01/26/2021)   Exercise Vital Sign    Days of Exercise per Week: 5 days    Minutes of Exercise per Session: 50 min  Stress: No Stress Concern Present (01/26/2021)   [place]    Feeling of Stress : Not at all  Social Connections: [place] (01/26/2021)   Social Connection and Isolation Panel [NHANES]    Frequency of Communication with Friends and Family: More than three times a week    Frequency of Social Gatherings with Friends and Family: More than three times a week    Attends Religious Services: 1 to 4 times per year    Active Member of [employer] or Organizations: No    Attends Archivist Meetings: 1 to 4 times per year    Marital Status: Married  Human resources officer Violence: Not At Risk (01/26/2021)   Humiliation,  Afraid, Rape, and Kick questionnaire    Fear of Current or Ex-Partner: No    Emotionally Abused: No    Physically Abused: No    Sexually Abused: No     Prior to Admission medications   Medication Sig Start Date End Date Taking? Authorizing Provider  acetaminophen (TYLENOL) 325 MG tablet Take 2 tablets (650 mg total) by mouth every 6 (six) hours as needed for mild pain or headache. 12/29/17   Erlene Quan, PA-C  alfuzosin (UROXATRAL) 10 MG 24 hr tablet Take 1 tablet (10 mg total) by mouth daily with breakfast. 12/27/21   McKenzie, Candee Furbish,  MD  aspirin EC 81 MG EC tablet Take 1 tablet (81 mg total) by mouth daily. 12/30/17   Erlene Quan, PA-C  atorvastatin (LIPITOR) 80 MG tablet TAKE 1 TABLET(80 MG) BY MOUTH DAILY AT 6 PM 01/19/22   Arnoldo Lenis, MD  mometasone (NASONEX) 50 MCG/ACT nasal spray 2 sprays daily. 12/02/19   [provider]  nitroGLYCERIN (NITROSTAT) 0.4 MG SL tablet Place 1 tablet (0.4 mg total) under the tongue every 5 (five) minutes x 3 doses as needed for chest pain. 12/29/17   Kroeger, Lorelee Cover., PA-C    No Known Allergies  REVIEW OF SYSTEMS:  General: no fevers/chills/night sweats Eyes: no blurry vision, diplopia, or amaurosis ENT: no sore throat or hearing loss Resp: no cough, wheezing, or hemoptysis CV: no edema or palpitations GI: no abdominal pain, nausea, vomiting, diarrhea, or constipation GU: no dysuria, frequency, or hematuria Skin: no rash Neuro: no headache, numbness, tingling, or weakness of extremities Musculoskeletal: no joint pain or swelling Heme: no bleeding, DVT, or easy bruising Endo: no polydipsia or polyuria  BP 116/60   Pulse 74   Ht 5\' 11"  (1.803 m)   Wt 199 lb 3.2 oz (90.4 kg)   SpO2 97%   BMI 27.78 kg/m   PHYSICAL EXAM: GEN:  AO x 3 in no acute distress HEENT: normal Dentition:  Some dental erosion is present Neck: JVP normal. + 1 carotid upstrokes without bruits. No thyromegaly. Lungs: equal expansion, clear  bilaterally CV: Apex is discrete and nondisplaced, RRR with 2/6 SEM Abd: soft, non-tender, non-distended; no bruit; positive bowel sounds Ext: no edema, ecchymoses, or cyanosis Vascular: 2+ femoral pulses, 2+ radial pulses       Skin: warm and dry without rash Neuro: CN II-XII grossly intact; motor and sensory grossly intact    DATA AND STUDIES:  EKG: June 2023 Sinus rhythm with occasional PVCs  2D ECHO: June 2023:   1. Left ventricular ejection fraction, by estimation, is 60 to 65%. The  left ventricle has normal function. The left ventricle has no regional  wall motion abnormalities. There is moderate concentric left ventricular  hypertrophy. Left ventricular  diastolic parameters are consistent with Grade I diastolic dysfunction  (impaired relaxation).   2. Right ventricular systolic function is normal. The right ventricular  size is normal. Tricuspid regurgitation signal is inadequate for assessing  PA pressure.   3. Left atrial size was mildly dilated.   4. The mitral valve is degenerative. No evidence of mitral valve  regurgitation. Mild mitral stenosis. Severe mitral annular calcification.   5. The aortic valve is calcified. Aortic valve regurgitation is mild.  Severe aortic valve stenosis.   6. The inferior vena cava is dilated in size with <50% respiratory  variability, suggesting right atrial pressure of 15 mmHg.  CARDIAC CATH: 2019: Moderate obstructive disease of the mid right coronary artery with a distal chronic total occlusion collateralized by the left system; high-grade proximal LAD lesion treated with 1 drug-eluting stent  STS RISK CALCULATOR: Pending  NHYA CLASS: 1    ASSESSMENT AND PLAN:   Nonrheumatic aortic valve stenosis  Coronary artery disease involving native coronary artery of native heart without angina pectoris  Thrombocytopenia (HCC)  Hyperlipidemia LDL goal <70  The patient continues to be asymptomatic with severe aortic stenosis (stage  C1).  I will have the patient follow-up up with me in 3 months with an echocardiogram.  I have asked the patient and his wife to continue to monitor his symptoms  and to contact us if he develops any increasing dyspnea, chest discomfort, presyncope or syncope.  I have personally reviewed the patients imaging data as summarized above.  I have reviewed the natural history of aortic stenosis with the patient and family members who are present today. We have discussed the limitations of medical therapy and the poor prognosis associated with symptomatic aortic stenosis. We have also reviewed potential treatment options, including palliative medical therapy, conventional surgical aortic valve replacement, and transcatheter aortic valve replacement. We discussed treatment options in the context of this patient's specific comorbid medical conditions.   All of the patient's questions were answered today. Will make further recommendations based on the results of studies outlined above.   Total time spent with patient today 50 minutes. This includes reviewing records, evaluating the patient and coordinating care.   Early Osmond, MD  08/03/2022 8:42 AM    [facility] [address], [place], [place]  [address] Phone: [phone]; Fax: [phone]

## 2022-08-23 ENCOUNTER — Other Ambulatory Visit: Payer: Self-pay | Admitting: Cardiology

## 2022-08-28 DIAGNOSIS — E663 Overweight: Secondary | ICD-10-CM | POA: Diagnosis not present

## 2022-08-28 DIAGNOSIS — J0100 Acute maxillary sinusitis, unspecified: Secondary | ICD-10-CM | POA: Diagnosis not present

## 2022-08-28 DIAGNOSIS — Z0001 Encounter for general adult medical examination with abnormal findings: Secondary | ICD-10-CM | POA: Diagnosis not present

## 2022-08-28 DIAGNOSIS — Z125 Encounter for screening for malignant neoplasm of prostate: Secondary | ICD-10-CM | POA: Diagnosis not present

## 2022-08-28 DIAGNOSIS — Z6828 Body mass index (BMI) 28.0-28.9, adult: Secondary | ICD-10-CM | POA: Diagnosis not present

## 2022-08-28 DIAGNOSIS — J209 Acute bronchitis, unspecified: Secondary | ICD-10-CM | POA: Diagnosis not present

## 2022-08-28 DIAGNOSIS — E7849 Other hyperlipidemia: Secondary | ICD-10-CM | POA: Diagnosis not present

## 2022-08-29 DIAGNOSIS — H40013 Open angle with borderline findings, low risk, bilateral: Secondary | ICD-10-CM | POA: Diagnosis not present

## 2022-09-07 ENCOUNTER — Inpatient Hospital Stay: Payer: Medicare Other | Attending: Physician Assistant

## 2022-09-07 DIAGNOSIS — D696 Thrombocytopenia, unspecified: Secondary | ICD-10-CM | POA: Diagnosis not present

## 2022-09-07 DIAGNOSIS — Z87891 Personal history of nicotine dependence: Secondary | ICD-10-CM | POA: Diagnosis not present

## 2022-09-07 LAB — CBC WITH DIFFERENTIAL/PLATELET
Abs Immature Granulocytes: 0.03 10*3/uL (ref 0.00–0.07)
Basophils Absolute: 0.1 10*3/uL (ref 0.0–0.1)
Basophils Relative: 1 %
Eosinophils Absolute: 0.1 10*3/uL (ref 0.0–0.5)
Eosinophils Relative: 2 %
HCT: 45.2 % (ref 39.0–52.0)
Hemoglobin: 15.7 g/dL (ref 13.0–17.0)
Immature Granulocytes: 0 %
Lymphocytes Relative: 22 %
Lymphs Abs: 1.7 10*3/uL (ref 0.7–4.0)
MCH: 34.2 pg — ABNORMAL HIGH (ref 26.0–34.0)
MCHC: 34.7 g/dL (ref 30.0–36.0)
MCV: 98.5 fL (ref 80.0–100.0)
Monocytes Absolute: 0.6 10*3/uL (ref 0.1–1.0)
Monocytes Relative: 8 %
Neutro Abs: 5.4 10*3/uL (ref 1.7–7.7)
Neutrophils Relative %: 67 %
Platelets: 147 10*3/uL — ABNORMAL LOW (ref 150–400)
RBC: 4.59 MIL/uL (ref 4.22–5.81)
RDW: 12.8 % (ref 11.5–15.5)
WBC: 8 10*3/uL (ref 4.0–10.5)
nRBC: 0 % (ref 0.0–0.2)

## 2022-09-07 LAB — VITAMIN B12: Vitamin B-12: 817 pg/mL (ref 180–914)

## 2022-09-07 LAB — FOLATE: Folate: 20.9 ng/mL (ref >5.9)

## 2022-09-12 ENCOUNTER — Inpatient Hospital Stay: Payer: Medicare Other | Admitting: Physician Assistant

## 2022-09-12 LAB — METHYLMALONIC ACID, SERUM: Methylmalonic Acid, Quantitative: 145 nmol/L (ref 0–378)

## 2022-09-12 NOTE — Progress Notes (Deleted)
UNABLE TO REACH PATIENT BY PHONE.  TODAY'S APPOINTMENT CANCELED AND VISIT RESCHEDULED.

## 2022-09-13 NOTE — Progress Notes (Signed)
Virtual Visit via Telephone Note [facility]  I connected with Jerry Roach  on 09/14/22 at  1:10 PM by telephone and verified that I am speaking with the correct person using two identifiers.  Location: Patient: Home Provider: [facility]   I discussed the limitations, risks, security and privacy concerns of performing an evaluation and management service by telephone and the availability of in person appointments. I also discussed with the patient that there may be a patient responsible charge related to this service. The patient expressed understanding and agreed to proceed.  REASON FOR VISIT:  Follow-up for mild thrombocytopenia   PRIOR THERAPY: None   CURRENT THERAPY: Observation   INTERVAL HISTORY:  Jerry Roach 76 y.o. male returns for routine follow-up of mild thrombocytopenia.  He was last seen by Tarri Abernethy PA-C on 03/08/2022.   At today's visit, he reports feeling at his usual baseline.  No recent hospitalizations, surgeries, or changes in baseline health status.   He admits to some mild easy bruising, but denies petechial rash.    No major bleeding events such as hematemesis, hematochezia, melena, epistaxis, gum bleeding, or hematuria.  He has not noted any B symptoms such as fever, chills, night sweats, unintentional weight loss.   He is taking daily Centrum Silver adult multivitamin since our last visit in June 2023.    He has  100% energy and  100% appetite. He endorses that he is maintaining a stable weight.    OBSERVATIONS/OBJECTIVE: Review of Systems  Constitutional:  Negative for chills, diaphoresis, fever, malaise/fatigue and weight loss.  Respiratory:  Negative for cough and shortness of breath.   Cardiovascular:  Negative for chest pain and palpitations.  Gastrointestinal:  Negative for abdominal pain, blood in stool, melena, nausea and vomiting.  Neurological:  Negative for dizziness and headaches.     PHYSICAL EXAM (per  limitations of virtual telephone visit): The patient is alert and oriented x 3, exhibiting adequate mentation, good mood, and ability to speak in full sentences and execute sound judgement.   ASSESSMENT & PLAN: 1.  Mild thrombocytopenia - Chronic mild thrombocytopenia since 2016 - Nutritional work-up unremarkable - normal B12, methylmalonic acid, folate, copper - Hepatitis B and hepatitis C negative, rheumatoid factor and ANA negative - H. pylori positive - Abdominal ultrasound (08/25/2021): Spleen size and appearance within normal limits.  Possible 4.5 cm mass within the right hepatic lobe. - MRI liver (09/20/2021): No hepatic steatosis, no suspicious hepatic lesion.  Spleen within normal limits. - Labs from May 2023 showed mildly elevated homocystine, but with normal folate, B12, MMA.  He was started on Centrum Silver Adult Multivitamin (contains 767 mcg folic acid and vitamin B12) for treatment of elevated homocystine. - He denies any bleeding events, bruising, or petechiae    - Most recent labs (09/07/2022): Platelets 147, stable at baseline.  Folate 20.9, B12 817, MMA 145. - Differential diagnosis favors mild ITP in the setting of positive H. pylori, but cannot exclude early MDS  - PLAN: Continue daily Centrum Silver adult multivitamin (341 mcg of folic acid and vitamin B12) - No other intervention needed at this time.  Can continue active surveillance. - Labs in 6 months (CBC/D, CMP, folate, B12, MMA, homocystine) - Patient can continue to take aspirin for CAD, no significant risk of bleeding with mild thrombocytopenia.   2.   Other history - PMH: NSTEMI and coronary artery disease, s/p PCI on 12/27/2017, aortic stenosis, and BPH - Social: Lives  at home with wife.  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 - Remote history of heavy alcohol use while he was in the [employer], but stopped drinking heavily about 50 years ago.   - He is a former smoker, but quit cigarettes 3  years ago at the time of his heart attack.  - His father had alcoholic liver cirrhosis.  No family history of cancer or blood clots   PLAN SUMMARY:  >> Labs in 6 months (CBC/D, CMP, folate, B12, MMA, homocystine) >> OFFICE visit 1 week after labs    I discussed the assessment and treatment plan with the patient. The patient was provided an opportunity to ask questions and all were answered. The patient agreed with the plan and demonstrated an understanding of the instructions.   The patient was advised to call back or seek an in-person evaluation if the symptoms worsen or if the condition fails to improve as anticipated.  I provided 18 minutes of non-face-to-face time during this encounter.   Harriett Rush, PA-C 09/14/22 1:58 PM

## 2022-09-14 ENCOUNTER — Encounter: Payer: Self-pay | Admitting: Physician Assistant

## 2022-09-14 ENCOUNTER — Other Ambulatory Visit: Payer: Self-pay

## 2022-09-14 ENCOUNTER — Telehealth: Payer: Medicare Other | Admitting: Physician Assistant

## 2022-09-14 ENCOUNTER — Inpatient Hospital Stay (HOSPITAL_BASED_OUTPATIENT_CLINIC_OR_DEPARTMENT_OTHER): Payer: Medicare Other | Admitting: Physician Assistant

## 2022-09-14 DIAGNOSIS — D696 Thrombocytopenia, unspecified: Secondary | ICD-10-CM | POA: Diagnosis not present

## 2022-09-17 ENCOUNTER — Other Ambulatory Visit: Payer: Self-pay

## 2022-09-17 DIAGNOSIS — C9000 Multiple myeloma not having achieved remission: Secondary | ICD-10-CM

## 2022-09-17 DIAGNOSIS — R7989 Other specified abnormal findings of blood chemistry: Secondary | ICD-10-CM

## 2022-09-17 DIAGNOSIS — D696 Thrombocytopenia, unspecified: Secondary | ICD-10-CM

## 2022-09-17 DIAGNOSIS — D508 Other iron deficiency anemias: Secondary | ICD-10-CM

## 2022-09-17 DIAGNOSIS — E538 Deficiency of other specified B group vitamins: Secondary | ICD-10-CM

## 2022-09-17 DIAGNOSIS — D500 Iron deficiency anemia secondary to blood loss (chronic): Secondary | ICD-10-CM

## 2022-09-19 DIAGNOSIS — N390 Urinary tract infection, site not specified: Secondary | ICD-10-CM | POA: Diagnosis not present

## 2022-09-20 ENCOUNTER — Other Ambulatory Visit: Payer: Self-pay | Admitting: Cardiology

## 2022-10-02 ENCOUNTER — Other Ambulatory Visit: Payer: Self-pay | Admitting: Cardiology

## 2022-10-16 ENCOUNTER — Encounter: Payer: Self-pay | Admitting: Nurse Practitioner

## 2022-10-16 ENCOUNTER — Other Ambulatory Visit: Payer: Self-pay | Admitting: Cardiology

## 2022-10-16 ENCOUNTER — Ambulatory Visit: Payer: Medicare Other | Attending: Nurse Practitioner | Admitting: Nurse Practitioner

## 2022-10-16 VITALS — BP 118/62 | HR 76 | Ht 70.0 in | Wt 204.0 lb

## 2022-10-16 DIAGNOSIS — E785 Hyperlipidemia, unspecified: Secondary | ICD-10-CM

## 2022-10-16 DIAGNOSIS — I350 Nonrheumatic aortic (valve) stenosis: Secondary | ICD-10-CM

## 2022-10-16 DIAGNOSIS — D696 Thrombocytopenia, unspecified: Secondary | ICD-10-CM | POA: Diagnosis not present

## 2022-10-16 DIAGNOSIS — I2510 Atherosclerotic heart disease of native coronary artery without angina pectoris: Secondary | ICD-10-CM | POA: Diagnosis not present

## 2022-10-16 DIAGNOSIS — I10 Essential (primary) hypertension: Secondary | ICD-10-CM

## 2022-10-16 NOTE — Progress Notes (Unsigned)
Cardiology Office Note:    Date: 10/16/2022  ID:  Jerry Roach, DOB 08/20/1946, MRN [id number]  PCP:  [facility], [place] Providers Cardiologist:  Early Osmond, MD     Referring MD: [facility], MD   CC: Here for follow-up  History of Present Illness:    Jerry Roach is a 77 y.o. male with a hx of the following:  Severe aortic stenosis CAD, status post PCI to P LAD in 2019, known CTO of distal RCA Hyperlipidemia Hypertension Former smoker Mild thrombocytopenia (followed by hematology)  Patient is a delightful 77 year old male with past medical history as mentioned above.  History of NSTEMI in 2019 and received drug-eluting stent to proximal LAD and noted to have CTO RCA with left-to-right collaterals.  He was evaluated in 2022 for anginal symptoms.  Repeat echo for surveillance of his aortic stenosis was arranged and revealed normal EF, no RWMA, mild LVH, and severe AS with mean gradient of 39 mmHg.  Seen by Bernerd Pho, PA-C on March 28, 2022.  Patient noted at that time dyspnea on exertion for past year, typically worse after consuming meals.  He was very active [place], and his shortness of breath did not limit his routine activities.  Denied any other acute cardiac complaints or issues.  Repeat echocardiogram was arranged and revealed normal EF, no RWMA, moderate LVH, grade 1 DD, severe AS with similar gradients to previous imaging.  He was referred to structural heart team.  Last seen by Dr. Ali Lowe on August 03, 2022.  He was noted to be asymptomatic with his severe aortic stenosis.  Dr. Ali Lowe recommended follow-up echocardiogram in 3 months and follow-up with structural heart team.  Treatment options were discussed.  Today he presents for follow-up.  He states he is doing well. Does have some throat soreness, denies any sore throat, thinks this is due to cold air. Recommended follow up with PCP and [facility]. 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 Denies any chest pain, shortness of breath, palpitations, syncope, presyncope, dizziness, orthopnea, PND, swelling or significant weight changes, acute bleeding, or claudication.  Denies any other questions or concerns today.  Tolerating all his medications well. Chart has been updated.   Past Medical History:  Diagnosis Date   CAD (coronary artery disease)    a. 12/2017: s/p NSTEMI with DES to Proximal LAD. CTO of RCA with L--> R collaterals noted.    Former smoker    HTN (hypertension)    Hypercholesteremia    Nocturia    Severe aortic stenosis    Thrombocytopenia (HCC)    Mild   Weak urinary stream     Past Surgical History:  Procedure Laterality Date   CATARACT EXTRACTION W/PHACO Left 03/03/2015   Procedure: CATARACT EXTRACTION PHACO AND INTRAOCULAR LENS PLACEMENT (IOC);  Surgeon: Baruch Goldmann, MD;  Location: [facility];  Service: Ophthalmology;  Laterality: Left;  CDE 6.86   CATARACT EXTRACTION W/PHACO Right 08/31/2015   Procedure: CATARACT EXTRACTION PHACO AND INTRAOCULAR LENS PLACEMENT RIGHT EYE CDE=4.08;  Surgeon: Baruch Goldmann, MD;  Location: [facility];  Service: Ophthalmology;  Laterality: Right;   COLONOSCOPY  11/05/2011   Procedure: COLONOSCOPY;  Surgeon: Daneil Dolin, MD;  Location: [facility];  Service: Endoscopy;  Laterality: N/A;  7:30 AM   CORONARY STENT INTERVENTION N/A 12/27/2017   Procedure: CORONARY STENT INTERVENTION;  Surgeon: Nelva Bush, MD;  Location: [facility];  Service: Cardiovascular;  Laterality: N/A;  LEFT HEART CATH AND CORONARY ANGIOGRAPHY N/A 12/27/2017   Procedure: LEFT HEART CATH AND CORONARY ANGIOGRAPHY;  Surgeon: Nelva Bush, MD;  Location: [facility];  Service: Cardiovascular;  Laterality: N/A;   NO PAST SURGERIES      Current Medications: Current Meds  Medication Sig   acetaminophen (TYLENOL) 325 MG tablet Take 2 tablets (650 mg total) by mouth every 6 (six) hours as needed for mild pain or headache.    alfuzosin (UROXATRAL) 10 MG 24 hr tablet Take 1 tablet (10 mg total) by mouth daily with breakfast.   aspirin EC 81 MG EC tablet Take 1 tablet (81 mg total) by mouth daily.   atorvastatin (LIPITOR) 80 MG tablet TAKE 1 TABLET(80 MG) BY MOUTH DAILY AT 6 PM   Flaxseed, Linseed, (FLAX SEED OIL PO) Take by mouth daily.   mometasone (NASONEX) 50 MCG/ACT nasal spray 2 sprays daily.   nitroGLYCERIN (NITROSTAT) 0.4 MG SL tablet Place 1 tablet (0.4 mg total) under the tongue every 5 (five) minutes x 3 doses as needed for chest pain.     Allergies:   Patient has no known allergies.   Social History   Socioeconomic History   Marital status: Married    Spouse name: Not on file   Number of children: Not on file   Years of education: Not on file   Highest education level: Not on file  Occupational History   Not on file  Tobacco Use   Smoking status: Former    Packs/day: 1.50    Years: 39.00    Total pack years: 58.50    Types: Cigarettes    Quit date: 12/26/2017    Years since quitting: 4.8   Smokeless tobacco: Never  Vaping Use   Vaping Use: Never used  Substance and Sexual Activity   Alcohol use: No   Drug use: No   Sexual activity: Yes    Birth control/protection: None  Other Topics Concern   Not on file  Social History Narrative   Not on file   Social Determinants of Health   Financial Resource Strain: Low Risk  (01/26/2021)   Overall Financial Resource Strain (CARDIA)    Difficulty of Paying Living Expenses: Not hard at all  Food Insecurity: No Food Insecurity (01/26/2021)   Hunger Vital Sign    Worried About Running Out of Food in the Last Year: Never true    Ran Out of Food in the Last Year: Never true  Transportation Needs: No Transportation Needs (01/26/2021)   PRAPARE - Hydrologist (Medical): No    Lack of Transportation (Non-Medical): No  Physical Activity: Sufficiently Active (01/26/2021)   Exercise Vital Sign    Days of Exercise per  Week: 5 days    Minutes of Exercise per Session: 50 min  Stress: No Stress Concern Present (01/26/2021)   [place]    Feeling of Stress : Not at all  Social Connections: [place] (01/26/2021)   Social Connection and Isolation Panel [NHANES]    Frequency of Communication with Friends and Family: More than three times a week    Frequency of Social Gatherings with Friends and Family: More than three times a week    Attends Religious Services: 1 to 4 times per year    Active Member of [employer] or Organizations: No    Attends Archivist Meetings: 1 to 4 times per year    Marital Status:  Married     Family History: The patient's family history includes COPD in his brother, mother, and sister; Hypertension in his father and sister. There is no history of Colon cancer.  ROS:   Review of Systems  Constitutional: Negative.   HENT:  Positive for sinus pain and sore throat. Negative for congestion, ear discharge, ear pain, hearing loss, nosebleeds and tinnitus.        Does have some sinus congestion. See HPI.   Eyes: Negative.   Respiratory: Negative.  Negative for stridor.   Cardiovascular: Negative.  Negative for chest pain.  Gastrointestinal: Negative.   Genitourinary: Negative.   Musculoskeletal: Negative.   Skin: Negative.   Neurological: Negative.   Endo/Heme/Allergies: Negative.   Psychiatric/Behavioral: Negative.      Please see the history of present illness.    All other systems reviewed and are negative.  EKGs/Labs/Other Studies Reviewed:    The following studies were reviewed today:   EKG:  EKG is not ordered today.    Echocardiogram on April 06, 2022: 1. Left ventricular ejection fraction, by estimation, is 60 to 65%. The  left ventricle has normal function. The left ventricle has no regional  wall motion abnormalities. There is moderate concentric left ventricular  hypertrophy. Left  ventricular  diastolic parameters are consistent with Grade I diastolic dysfunction  (impaired relaxation).   2. Right ventricular systolic function is normal. The right ventricular  size is normal. Tricuspid regurgitation signal is inadequate for assessing  PA pressure.   3. Left atrial size was mildly dilated.   4. The mitral valve is degenerative. No evidence of mitral valve  regurgitation. Mild mitral stenosis. Severe mitral annular calcification.   5. The aortic valve is calcified. Aortic valve regurgitation is mild.  Severe aortic valve stenosis.   6. The inferior vena cava is dilated in size with <50% respiratory  variability, suggesting right atrial pressure of 15 mmHg.   Comparison(s): Severe aortic stenosis persists.  Bilateral carotid duplex on  03/12/2019: Summary:  Right Carotid: Velocities in the right ICA are consistent with a 1-39%  stenosis.   Left Carotid: Velocities in the left ICA are consistent with a 1-39%  stenosis.   Vertebrals: Bilateral vertebral arteries demonstrate antegrade flow.  Subclavians: Normal flow hemodynamics were seen in bilateral subclavian               arteries.  Left heart cath on December 27, 2017: Conclusions: Severe 2-vessel coronary artery disease, including sequential 80% and 40% proximal and mid LAD stenoses, as well as 50% ostial and 100% distal RCA lesions. Moderate disease involving LCx and dominant OM branch. Collateralization of distal RCA branches and appearance of thrombus in the distal RCA suggests subacute thrombosis. Mildly elevated left ventricular filling pressure. Moderately elevated LVOT gradient, likely due to aortic stenosis. Basal and mid inferior hypokinesis with otherwise preserved left ventricular contraction. Successful PCI to the proximal LAD using Resolute Onyx 3.0 x 18 mm drug-eluting stent with 0% residual stenosis and TIMI-3 flow.   Recommendations: Medical therapy for moderate LCx disease and occluded distal  RCA with left-to-right collaterals. Dual antiplatelet therapy with aspirin and ticagrelor for at least 12 months. Aggressive secondary prevention. Obtain echocardiogram for further evaluation of suspect aortic stenosis.  Recent Labs: 02/26/2022: ALT 16; BUN 14; Creatinine, Ser 1.25; Potassium 4.0; Sodium 138 09/07/2022: Hemoglobin 15.7; Platelets 147  Recent Lipid Panel    Component Value Date/Time   CHOL 113 08/02/2020 1340   TRIG 56 08/02/2020 1340  HDL 43 08/02/2020 1340   CHOLHDL 2.6 08/02/2020 1340   VLDL 11 08/02/2020 1340   LDLCALC 59 08/02/2020 1340    Physical Exam:    VS:  BP 118/62   Pulse 76   Ht 5\' 10"  (1.778 m)   Wt 204 lb (92.5 kg)   SpO2 97%   BMI 29.27 kg/m     Wt Readings from Last 3 Encounters:  10/16/22 204 lb (92.5 kg)  08/03/22 199 lb 3.2 oz (90.4 kg)  04/09/22 207 lb (93.9 kg)     GEN: Well nourished, well developed in no acute distress HEENT: Normal NECK: No JVD; No carotid bruits CARDIAC: S1/S2, RRR, Grade 2/6 systolic murmur with radiation to carotids, no rubs or gallops noted; 2+ pulses throughout RESPIRATORY:  Clear to auscultation without rales, wheezing or rhonchi  MUSCULOSKELETAL:  No edema; No deformity  SKIN: Warm and dry NEUROLOGIC:  Alert and oriented x 3 PSYCHIATRIC:  Normal affect   ASSESSMENT:    1. Severe aortic stenosis   2. Coronary artery disease involving native coronary artery of native heart without angina pectoris   3. Hypertension, unspecified type   4. Hyperlipidemia, unspecified hyperlipidemia type   5. Thrombocytopenia ([place])    PLAN:    In order of problems listed above:  Severe aortic stenosis Has seen by echocardiogram in June 2023.  He has upcoming echocardiogram that will be performed on January 22.  Asymptomatic.  Denies any concerning complaints or issues.  Grade 2/6 systolic murmur heard throughout with radiation to carotid arteries.  Discussed care precautions and ED precautions.  Continue aspirin.   Continue to follow-up with Dr. Ali Lowe to discuss TAVR.  Heart healthy diet and hydration recommended.   CAD, s/p PCI to P LAD in 2019, known CTO of distal RCA Stable with no anginal symptoms. No indication for ischemic evaluation.  Continue aspirin, atorvastatin, nitroglycerin as needed.  Heart healthy diet recommended. Obtaining labs as mentioned below.   HTN Blood pressure stable.  Denies any BP issues at home. Discussed to monitor BP at home at least 2 hours after medications and sitting for 5-10 minutes.  Heart healthy diet and hydration recommended.  HLD Last lipid panel in 2022 was stable.  Will obtain FLP and LFT.  Continue atorvastatin.  Heart healthy diet recommended.  Mild thrombocytopenia Most recent labs stable.  Continue to follow-up with Oncology.     6. Disposition: Follow-up with Dr. Ali Lowe later this month as scheduled. Follow-up with Dr. Carlyle Dolly in 6 months or sooner if anything changes.  Medication Adjustments/Labs and Tests Ordered: Current medicines are reviewed at length with the patient today.  Concerns regarding medicines are outlined above.  Orders Placed This Encounter  Procedures   Hepatic function panel   Lipid Profile   No orders of the defined types were placed in this encounter.   Patient Instructions  Medication Instructions:  Your physician recommends that you continue on your current medications as directed. Please refer to the Current Medication list given to you today.   Labwork: Fasting Lipid and LFT's in 2 months - mid March  Testing/Procedures: None today  Follow-Up: 6 months Dr.Branch  Any Other Special Instructions Will Be Listed Below (If Applicable).  If you need a refill on your cardiac medications before your next appointment, please call your pharmacy.    Signed, Finis Bud, NP  10/17/2022 3:39 PM    [place]

## 2022-10-16 NOTE — Patient Instructions (Signed)
Medication Instructions:  Your physician recommends that you continue on your current medications as directed. Please refer to the Current Medication list given to you today.   Labwork: Fasting Lipid and LFT's in 2 months - mid March  Testing/Procedures: None today  Follow-Up: 6 months Dr.Branch  Any Other Special Instructions Will Be Listed Below (If Applicable).  If you need a refill on your cardiac medications before your next appointment, please call your pharmacy.

## 2022-10-17 ENCOUNTER — Encounter: Payer: Self-pay | Admitting: Nurse Practitioner

## 2022-10-22 ENCOUNTER — Ambulatory Visit (HOSPITAL_COMMUNITY)
Admission: RE | Admit: 2022-10-22 | Discharge: 2022-10-22 | Disposition: A | Discharge status: Home / Self Care | Payer: Medicare Other | Source: Ambulatory Visit | Attending: Internal Medicine | Admitting: Internal Medicine

## 2022-10-22 ENCOUNTER — Other Ambulatory Visit (HOSPITAL_COMMUNITY): Payer: Self-pay | Admitting: Internal Medicine

## 2022-10-22 DIAGNOSIS — Z6828 Body mass index (BMI) 28.0-28.9, adult: Secondary | ICD-10-CM | POA: Diagnosis not present

## 2022-10-22 DIAGNOSIS — K5900 Constipation, unspecified: Secondary | ICD-10-CM | POA: Diagnosis not present

## 2022-10-22 DIAGNOSIS — K5641 Fecal impaction: Secondary | ICD-10-CM | POA: Diagnosis not present

## 2022-10-22 DIAGNOSIS — E663 Overweight: Secondary | ICD-10-CM | POA: Diagnosis not present

## 2022-10-22 DIAGNOSIS — J984 Other disorders of lung: Secondary | ICD-10-CM | POA: Diagnosis not present

## 2022-10-22 DIAGNOSIS — R918 Other nonspecific abnormal finding of lung field: Secondary | ICD-10-CM | POA: Diagnosis not present

## 2022-10-25 ENCOUNTER — Encounter: Payer: Self-pay | Admitting: Internal Medicine

## 2022-10-29 ENCOUNTER — Other Ambulatory Visit (HOSPITAL_COMMUNITY): Payer: Self-pay | Admitting: Internal Medicine

## 2022-10-29 ENCOUNTER — Ambulatory Visit (HOSPITAL_COMMUNITY)
Admission: RE | Admit: 2022-10-29 | Discharge: 2022-10-29 | Disposition: A | Discharge status: Home / Self Care | Payer: Medicare Other | Source: Ambulatory Visit | Attending: Internal Medicine | Admitting: Internal Medicine

## 2022-10-29 DIAGNOSIS — I351 Nonrheumatic aortic (valve) insufficiency: Secondary | ICD-10-CM

## 2022-10-29 DIAGNOSIS — I350 Nonrheumatic aortic (valve) stenosis: Secondary | ICD-10-CM | POA: Insufficient documentation

## 2022-10-29 DIAGNOSIS — R911 Solitary pulmonary nodule: Secondary | ICD-10-CM

## 2022-10-29 LAB — ECHOCARDIOGRAM COMPLETE
AR max vel: 0.61 cm2
AV Area VTI: 0.58 cm2
AV Area mean vel: 0.59 cm2
AV Mean grad: 44.5 mmHg
AV Peak grad: 71.9 mmHg
Ao pk vel: 4.24 m/s
Area-P 1/2: 2.76 cm2
P 1/2 time: 400 msec
S' Lateral: 3.1 cm

## 2022-10-29 NOTE — Progress Notes (Signed)
*  PRELIMINARY RESULTS* Echocardiogram 2D Echocardiogram has been performed.  Stacey Drain 10/29/2022, 11:17 AM

## 2022-11-06 ENCOUNTER — Ambulatory Visit: Payer: Medicare Other | Attending: Internal Medicine | Admitting: Internal Medicine

## 2022-11-06 ENCOUNTER — Encounter: Payer: Self-pay | Admitting: Internal Medicine

## 2022-11-06 VITALS — BP 110/58 | HR 67 | Ht 70.0 in | Wt 204.6 lb

## 2022-11-06 DIAGNOSIS — I2510 Atherosclerotic heart disease of native coronary artery without angina pectoris: Secondary | ICD-10-CM | POA: Diagnosis not present

## 2022-11-06 DIAGNOSIS — I10 Essential (primary) hypertension: Secondary | ICD-10-CM | POA: Insufficient documentation

## 2022-11-06 DIAGNOSIS — R0609 Other forms of dyspnea: Secondary | ICD-10-CM | POA: Diagnosis not present

## 2022-11-06 DIAGNOSIS — I350 Nonrheumatic aortic (valve) stenosis: Secondary | ICD-10-CM | POA: Diagnosis not present

## 2022-11-06 DIAGNOSIS — R0600 Dyspnea, unspecified: Secondary | ICD-10-CM | POA: Diagnosis not present

## 2022-11-06 NOTE — Patient Instructions (Signed)
Medication Instructions:  No changes *If you need a refill on your cardiac medications before your next appointment, please call your pharmacy*   Lab Work: Today: [id number]   Testing/Procedures: Your physician has requested that you have an exercise tolerance test. For further information please visit [email or web address]. Please also follow instruction sheet, as given.  Follow-Up: At [facility], you and your health needs are our priority.  As part of our continuing mission to provide you with exceptional heart care, we have created designated Provider Care Teams.  These Care Teams include your primary Cardiologist (physician) and Advanced Practice Providers (APPs -  Physician Assistants and Nurse Practitioners) who all work together to provide you with the care you need, when you need it.  We recommend signing up for the patient portal called "MyChart".  Sign up information is provided on this After Visit Summary.  MyChart is used to connect with patients for Virtual Visits (Telemedicine).  Patients are able to view lab/test results, encounter notes, upcoming appointments, etc.  Non-urgent messages can be sent to your provider as well.   To learn more about what you can do with MyChart, go to [email or web address].    Your next appointment:   3 month(s)  Provider:   Early Osmond, MD

## 2022-11-06 NOTE — H&P (View-Only) (Signed)
Patient ID: Jerry Roach MRN: [id number] DOB/AGE: [birth year] 76 y.o.  Primary Care Physician:Fusco, Purcell Nails, MD Primary Cardiologist: Carlyle Dolly, MD   FOCUSED CARDIOVASCULAR PROBLEM LIST:   1.  Severe aortic stenosis with an aortic valve area of 0.41 cm, mean gradient 44 mmHg, and a peak velocity of 4 m/s with ejection fraction of 60 to 65%; no conduction abnormalities 2.  Coronary artery disease status post PCI of proximal LAD 2019 with known chronic total occlusion of distal right coronary 3.  Hypertension 4.  Hyperlipidemia 5.  Prior tobacco abuse 6.  Mild thrombocytopenia followed by hematology; on chronic aspirin monotherapy   HISTORY OF PRESENT ILLNESS:  July 2023 consultation: The patient is a 77 y.o. male with the indicated medical history here for recommendations regarding his severe symptomatic aortic stenosis.  He was recently seen by PA Strader and then endorsed some increasing dyspnea on exertion.  The patient is here with his wife.  He continues to farm.  He tells me he may be a little bit more short of breath but otherwise can work a 12-hour day without any issues.  He denies any exertional angina.  Has had no presyncope or syncope.  He denies any paroxysmal nocturnal dyspnea, orthopnea, or symptomatic palpitations.  He feels very well and is able to do everything he needs to do in a day.  Compared to last year he is may be a tad bit slower but this does not really bother him too much.  He sees a Pharmacist, community on a regular basis and needs to have a tooth extracted he tells me.  He does not smoke.  Plan:  Follow up in 3 months with echocardiogram and monitor for symptoms.  October 2023: The patient did not appear for his appointment earlier this month as he was busy on his farm.  He is very active on his farm.  He continues to work for 13 to 14 hours without any limitations.  He tells me sometimes when he eats a big breakfast he will get somewhat short of breath in the  morning but this lasts only an hour or so.  He denies any shortness of breath, significant chest tightness, presyncope or syncope.  He is required no emergency room visits or hospitalizations.  He is otherwise well and without significant cardiovascular complaints today.  Plan:  Monitor for now.  Today: In the interim the patient had an echocardiogram which demonstrated severe aortic stenosis with a preserved ejection fraction.  He tells me he is able to do everything he needs to do in a day.  He feels like sometimes he is slowing down but thinks this is due to old age.Marland Kitchen  He denies any significant chest pain or dyspnea.  He denies any presyncope or syncope.  He has not required emergency room visits or hospitalizations.  He tells me that his wife is having a lot of issues with arthritis and he is taking care of her a lot.  He did see a dentist 6 months ago and did have a tooth extracted.  Past Medical History:  Diagnosis Date   CAD (coronary artery disease)    a. 12/2017: s/p NSTEMI with DES to Proximal LAD. CTO of RCA with L--> R collaterals noted.    Former smoker    HTN (hypertension)    Hypercholesteremia    Nocturia    Severe aortic stenosis    Thrombocytopenia (HCC)    Mild   Weak urinary stream  Past Surgical History:  Procedure Laterality Date   CATARACT EXTRACTION W/PHACO Left 03/03/2015   Procedure: CATARACT EXTRACTION PHACO AND INTRAOCULAR LENS PLACEMENT (IOC);  Surgeon: Baruch Goldmann, MD;  Location: [facility];  Service: Ophthalmology;  Laterality: Left;  CDE 6.86   CATARACT EXTRACTION W/PHACO Right 08/31/2015   Procedure: CATARACT EXTRACTION PHACO AND INTRAOCULAR LENS PLACEMENT RIGHT EYE CDE=4.08;  Surgeon: Baruch Goldmann, MD;  Location: [facility];  Service: Ophthalmology;  Laterality: Right;   COLONOSCOPY  11/05/2011   Procedure: COLONOSCOPY;  Surgeon: Daneil Dolin, MD;  Location: [facility];  Service: Endoscopy;  Laterality: N/A;  7:30 AM   CORONARY STENT INTERVENTION N/A  12/27/2017   Procedure: CORONARY STENT INTERVENTION;  Surgeon: Nelva Bush, MD;  Location: [facility];  Service: Cardiovascular;  Laterality: N/A;   LEFT HEART CATH AND CORONARY ANGIOGRAPHY N/A 12/27/2017   Procedure: LEFT HEART CATH AND CORONARY ANGIOGRAPHY;  Surgeon: Nelva Bush, MD;  Location: [facility];  Service: Cardiovascular;  Laterality: N/A;   NO PAST SURGERIES      Family History  Problem Relation Age of Onset   Hypertension Father    COPD Mother    Hypertension Sister    COPD Sister    COPD Brother    Colon cancer Neg Hx     Social History   Socioeconomic History   Marital status: Married    Spouse name: Not on file   Number of children: Not on file   Years of education: Not on file   Highest education level: Not on file  Occupational History   Not on file  Tobacco Use   Smoking status: Former    Packs/day: 1.50    Years: 39.00    Total pack years: 58.50    Types: Cigarettes    Quit date: 12/26/2017    Years since quitting: 4.8   Smokeless tobacco: Never  Vaping Use   Vaping Use: Never used  Substance and Sexual Activity   Alcohol use: No   Drug use: No   Sexual activity: Yes    Birth control/protection: None  Other Topics Concern   Not on file  Social History Narrative   Not on file   Social Determinants of Health   Financial Resource Strain: Low Risk  (01/26/2021)   Overall Financial Resource Strain (CARDIA)    Difficulty of Paying Living Expenses: Not hard at all  Food Insecurity: No Food Insecurity (01/26/2021)   Hunger Vital Sign    Worried About Running Out of Food in the Last Year: Never true    [place] in the Last Year: Never true  Transportation Needs: No Transportation Needs (01/26/2021)   PRAPARE - Hydrologist (Medical): No    Lack of Transportation (Non-Medical): No  Physical Activity: Sufficiently Active (01/26/2021)   Exercise Vital Sign    Days of Exercise per Week: 5 days     Minutes of Exercise per Session: 50 min  Stress: No Stress Concern Present (01/26/2021)   [place]    Feeling of Stress : Not at all  Social Connections: [place] (01/26/2021)   Social Connection and Isolation Panel [NHANES]    Frequency of Communication with Friends and Family: More than three times a week    Frequency of Social Gatherings with Friends and Family: More than three times a week    Attends Religious Services: 1 to 4 times per year  Active Member of Clubs or Organizations: No    Attends Archivist Meetings: 1 to 4 times per year    Marital Status: Married  Human resources officer Violence: Not At Risk (01/26/2021)   Humiliation, Afraid, Rape, and Kick questionnaire    Fear of Current or Ex-Partner: No    Emotionally Abused: No    Physically Abused: No    Sexually Abused: No     Prior to Admission medications   Medication Sig Start Date End Date Taking? Authorizing Provider  acetaminophen (TYLENOL) 325 MG tablet Take 2 tablets (650 mg total) by mouth every 6 (six) hours as needed for mild pain or headache. 12/29/17   Erlene Quan, PA-C  alfuzosin (UROXATRAL) 10 MG 24 hr tablet Take 1 tablet (10 mg total) by mouth daily with breakfast. 12/27/21   Cleon Gustin, MD  aspirin EC 81 MG EC tablet Take 1 tablet (81 mg total) by mouth daily. 12/30/17   Erlene Quan, PA-C  atorvastatin (LIPITOR) 80 MG tablet TAKE 1 TABLET(80 MG) BY MOUTH DAILY AT 6 PM 01/19/22   Arnoldo Lenis, MD  mometasone (NASONEX) 50 MCG/ACT nasal spray 2 sprays daily. 12/02/19   [provider]  nitroGLYCERIN (NITROSTAT) 0.4 MG SL tablet Place 1 tablet (0.4 mg total) under the tongue every 5 (five) minutes x 3 doses as needed for chest pain. 12/29/17   Kroeger, Lorelee Cover., PA-C    No Known Allergies  REVIEW OF SYSTEMS:  General: no fevers/chills/night sweats Eyes: no blurry vision, diplopia, or  amaurosis ENT: no sore throat or hearing loss Resp: no cough, wheezing, or hemoptysis CV: no edema or palpitations GI: no abdominal pain, nausea, vomiting, diarrhea, or constipation GU: no dysuria, frequency, or hematuria Skin: no rash Neuro: no headache, numbness, tingling, or weakness of extremities Musculoskeletal: no joint pain or swelling Heme: no bleeding, DVT, or easy bruising Endo: no polydipsia or polyuria  BP (!) 110/58   Pulse 67   Ht 5\' 10"  (1.778 m)   Wt 204 lb 9.6 oz (92.8 kg)   SpO2 98%   BMI 29.36 kg/m   PHYSICAL EXAM: GEN:  AO x 3 in no acute distress HEENT: normal Dentition:  Some dental erosion is present Neck: JVP normal. + 1 carotid upstrokes without bruits. No thyromegaly. Lungs: equal expansion, clear bilaterally CV: Apex is discrete and nondisplaced, RRR with 2/6 SEM Abd: soft, non-tender, non-distended; no bruit; positive bowel sounds Ext: no edema, ecchymoses, or cyanosis Vascular: 2+ femoral pulses, 2+ radial pulses       Skin: warm and dry without rash Neuro: CN II-XII grossly intact; motor and sensory grossly intact    DATA AND STUDIES:  EKG: June 2023 Sinus rhythm with occasional PVCs  2D ECHO:  January 2024: 1. Left ventricular ejection fraction, by estimation, is 65 to 70%. The  left ventricle has normal function. The left ventricle has no regional  wall motion abnormalities. There is moderate left ventricular hypertrophy.  Left ventricular diastolic  parameters are indeterminate. Elevated left atrial pressure.   2. Right ventricular systolic function is normal. The right ventricular  size is normal.   3. Left atrial size was mildly dilated.   4. The mitral valve is normal in structure. No evidence of mitral valve  regurgitation. No evidence of mitral stenosis. Severe mitral annular  calcification.   5. The aortic valve has an indeterminant number of cusps. There is  moderate calcification of the aortic valve. There is moderate  thickening  of the aortic valve. Aortic valve regurgitation is mild to moderate.  Severe aortic valve stenosis. Aortic valve  mean gradient measures 44.5 mmHg. Aortic valve peak gradient measures 71.9  mmHg. Aortic valve area, by VTI measures 0.58 cm.   6. The inferior vena cava is dilated in size with >50% respiratory  variability, suggesting right atrial pressure of 8 mmHg.   June 2023:   1. Left ventricular ejection fraction, by estimation, is 60 to 65%. The  left ventricle has normal function. The left ventricle has no regional  wall motion abnormalities. There is moderate concentric left ventricular  hypertrophy. Left ventricular  diastolic parameters are consistent with Grade I diastolic dysfunction  (impaired relaxation).   2. Right ventricular systolic function is normal. The right ventricular  size is normal. Tricuspid regurgitation signal is inadequate for assessing  PA pressure.   3. Left atrial size was mildly dilated.   4. The mitral valve is degenerative. No evidence of mitral valve  regurgitation. Mild mitral stenosis. Severe mitral annular calcification.   5. The aortic valve is calcified. Aortic valve regurgitation is mild.  Severe aortic valve stenosis.   6. The inferior vena cava is dilated in size with <50% respiratory  variability, suggesting right atrial pressure of 15 mmHg.  CARDIAC CATH: 2019: Moderate obstructive disease of the mid right coronary artery with a distal chronic total occlusion collateralized by the left system; high-grade proximal LAD lesion treated with 1 drug-eluting stent  STS RISK CALCULATOR: Pending  NHYA CLASS: 1    ASSESSMENT AND PLAN:   Severe aortic stenosis - Plan: Exercise Tolerance Test, Pro b natriuretic peptide (BNP)  Coronary artery disease involving native coronary artery of native heart without angina pectoris  Primary hypertension  The patient continues to be asymptomatic with severe aortic stenosis (stage C1).  His  echocardiogram demonstrates severe aortic stenosis with preserved LV function.  We talked about potential treatment options.  I would like to objectively assess his exercise capacity and compare that to age-appropriate norms.  I will refer him for an exercise treadmill stress test.  Additionally I will refer him for a BNP.  If this is elevated there is data to suggest that an asymptomatic patient's early aortic valve intervention would be beneficial.  We did speak about surgical aortic valve replacement versus a TAVR procedure.  Given that he cares for his wife he would prefer a TAVR procedure if possible.  I will otherwise see him back in 3 months but if he does not do well on exercise treadmill stress test I we will plan on expediting his evaluation including cardiac catheterization and CT scanning.  I have personally reviewed the patients imaging data as summarized above.  I have reviewed the natural history of aortic stenosis with the patient and family members who are present today. We have discussed the limitations of medical therapy and the poor prognosis associated with symptomatic aortic stenosis. We have also reviewed potential treatment options, including palliative medical therapy, conventional surgical aortic valve replacement, and transcatheter aortic valve replacement. We discussed treatment options in the context of this patient's specific comorbid medical conditions.   All of the patient's questions were answered today. Will make further recommendations based on the results of studies outlined above.   Total time spent with patient today 50 minutes. This includes reviewing records, evaluating the patient and coordinating care.   Early Osmond, MD  11/06/2022 8:47 AM    [address],  [place], [place]  [id number] Phone: [phone]; Fax: [phone]

## 2022-11-06 NOTE — Progress Notes (Addendum)
Patient ID: Jerry Roach MRN: [id number] DOB/AGE: [birth year] 76 y.o.  Primary Care Physician:Fusco, Purcell Nails, MD Primary Cardiologist: Carlyle Dolly, MD   FOCUSED CARDIOVASCULAR PROBLEM LIST:   1.  Severe aortic stenosis with an aortic valve area of 0.41 cm, mean gradient 44 mmHg, and a peak velocity of 4 m/s with ejection fraction of 60 to 65%; no conduction abnormalities 2.  Coronary artery disease status post PCI of proximal LAD 2019 with known chronic total occlusion of distal right coronary 3.  Hypertension 4.  Hyperlipidemia 5.  Prior tobacco abuse 6.  Mild thrombocytopenia followed by hematology; on chronic aspirin monotherapy   HISTORY OF PRESENT ILLNESS:  July 2023 consultation: The patient is a 77 y.o. male with the indicated medical history here for recommendations regarding his severe symptomatic aortic stenosis.  He was recently seen by PA Strader and then endorsed some increasing dyspnea on exertion.  The patient is here with his wife.  He continues to farm.  He tells me he may be a little bit more short of breath but otherwise can work a 12-hour day without any issues.  He denies any exertional angina.  Has had no presyncope or syncope.  He denies any paroxysmal nocturnal dyspnea, orthopnea, or symptomatic palpitations.  He feels very well and is able to do everything he needs to do in a day.  Compared to last year he is may be a tad bit slower but this does not really bother him too much.  He sees a Pharmacist, community on a regular basis and needs to have a tooth extracted he tells me.  He does not smoke.  Plan:  Follow up in 3 months with echocardiogram and monitor for symptoms.  October 2023: The patient did not appear for his appointment earlier this month as he was busy on his farm.  He is very active on his farm.  He continues to work for 13 to 14 hours without any limitations.  He tells me sometimes when he eats a big breakfast he will get somewhat short of breath in the  morning but this lasts only an hour or so.  He denies any shortness of breath, significant chest tightness, presyncope or syncope.  He is required no emergency room visits or hospitalizations.  He is otherwise well and without significant cardiovascular complaints today.  Plan:  Monitor for now.  Today: In the interim the patient had an echocardiogram which demonstrated severe aortic stenosis with a preserved ejection fraction.  He tells me he is able to do everything he needs to do in a day.  He feels like sometimes he is slowing down but thinks this is due to old age.Marland Kitchen  He denies any significant chest pain or dyspnea.  He denies any presyncope or syncope.  He has not required emergency room visits or hospitalizations.  He tells me that his wife is having a lot of issues with arthritis and he is taking care of her a lot.  He did see a dentist 6 months ago and did have a tooth extracted.  Past Medical History:  Diagnosis Date   CAD (coronary artery disease)    a. 12/2017: s/p NSTEMI with DES to Proximal LAD. CTO of RCA with L--> R collaterals noted.    Former smoker    HTN (hypertension)    Hypercholesteremia    Nocturia    Severe aortic stenosis    Thrombocytopenia (HCC)    Mild   Weak urinary stream  Past Surgical History:  Procedure Laterality Date   CATARACT EXTRACTION W/PHACO Left 03/03/2015   Procedure: CATARACT EXTRACTION PHACO AND INTRAOCULAR LENS PLACEMENT (IOC);  Surgeon: Baruch Goldmann, MD;  Location: [facility];  Service: Ophthalmology;  Laterality: Left;  CDE 6.86   CATARACT EXTRACTION W/PHACO Right 08/31/2015   Procedure: CATARACT EXTRACTION PHACO AND INTRAOCULAR LENS PLACEMENT RIGHT EYE CDE=4.08;  Surgeon: Baruch Goldmann, MD;  Location: [facility];  Service: Ophthalmology;  Laterality: Right;   COLONOSCOPY  11/05/2011   Procedure: COLONOSCOPY;  Surgeon: Daneil Dolin, MD;  Location: [facility];  Service: Endoscopy;  Laterality: N/A;  7:30 AM   CORONARY STENT INTERVENTION N/A  12/27/2017   Procedure: CORONARY STENT INTERVENTION;  Surgeon: Nelva Bush, MD;  Location: [facility];  Service: Cardiovascular;  Laterality: N/A;   LEFT HEART CATH AND CORONARY ANGIOGRAPHY N/A 12/27/2017   Procedure: LEFT HEART CATH AND CORONARY ANGIOGRAPHY;  Surgeon: Nelva Bush, MD;  Location: [facility];  Service: Cardiovascular;  Laterality: N/A;   NO PAST SURGERIES      Family History  Problem Relation Age of Onset   Hypertension Father    COPD Mother    Hypertension Sister    COPD Sister    COPD Brother    Colon cancer Neg Hx     Social History   Socioeconomic History   Marital status: Married    Spouse name: Not on file   Number of children: Not on file   Years of education: Not on file   Highest education level: Not on file  Occupational History   Not on file  Tobacco Use   Smoking status: Former    Packs/day: 1.50    Years: 39.00    Total pack years: 58.50    Types: Cigarettes    Quit date: 12/26/2017    Years since quitting: 4.8   Smokeless tobacco: Never  Vaping Use   Vaping Use: Never used  Substance and Sexual Activity   Alcohol use: No   Drug use: No   Sexual activity: Yes    Birth control/protection: None  Other Topics Concern   Not on file  Social History Narrative   Not on file   Social Determinants of Health   Financial Resource Strain: Low Risk  (01/26/2021)   Overall Financial Resource Strain (CARDIA)    Difficulty of Paying Living Expenses: Not hard at all  Food Insecurity: No Food Insecurity (01/26/2021)   Hunger Vital Sign    Worried About Running Out of Food in the Last Year: Never true    [place] in the Last Year: Never true  Transportation Needs: No Transportation Needs (01/26/2021)   PRAPARE - Hydrologist (Medical): No    Lack of Transportation (Non-Medical): No  Physical Activity: Sufficiently Active (01/26/2021)   Exercise Vital Sign    Days of Exercise per Week: 5 days     Minutes of Exercise per Session: 50 min  Stress: No Stress Concern Present (01/26/2021)   [place]    Feeling of Stress : Not at all  Social Connections: [place] (01/26/2021)   Social Connection and Isolation Panel [NHANES]    Frequency of Communication with Friends and Family: More than three times a week    Frequency of Social Gatherings with Friends and Family: More than three times a week    Attends Religious Services: 1 to 4 times per year  Active Member of Clubs or Organizations: No    Attends Archivist Meetings: 1 to 4 times per year    Marital Status: Married  Human resources officer Violence: Not At Risk (01/26/2021)   Humiliation, Afraid, Rape, and Kick questionnaire    Fear of Current or Ex-Partner: No    Emotionally Abused: No    Physically Abused: No    Sexually Abused: No     Prior to Admission medications   Medication Sig Start Date End Date Taking? Authorizing Provider  acetaminophen (TYLENOL) 325 MG tablet Take 2 tablets (650 mg total) by mouth every 6 (six) hours as needed for mild pain or headache. 12/29/17   Erlene Quan, PA-C  alfuzosin (UROXATRAL) 10 MG 24 hr tablet Take 1 tablet (10 mg total) by mouth daily with breakfast. 12/27/21   Cleon Gustin, MD  aspirin EC 81 MG EC tablet Take 1 tablet (81 mg total) by mouth daily. 12/30/17   Erlene Quan, PA-C  atorvastatin (LIPITOR) 80 MG tablet TAKE 1 TABLET(80 MG) BY MOUTH DAILY AT 6 PM 01/19/22   Arnoldo Lenis, MD  mometasone (NASONEX) 50 MCG/ACT nasal spray 2 sprays daily. 12/02/19   [provider]  nitroGLYCERIN (NITROSTAT) 0.4 MG SL tablet Place 1 tablet (0.4 mg total) under the tongue every 5 (five) minutes x 3 doses as needed for chest pain. 12/29/17   Kroeger, Lorelee Cover., PA-C    No Known Allergies  REVIEW OF SYSTEMS:  General: no fevers/chills/night sweats Eyes: no blurry vision, diplopia, or  amaurosis ENT: no sore throat or hearing loss Resp: no cough, wheezing, or hemoptysis CV: no edema or palpitations GI: no abdominal pain, nausea, vomiting, diarrhea, or constipation GU: no dysuria, frequency, or hematuria Skin: no rash Neuro: no headache, numbness, tingling, or weakness of extremities Musculoskeletal: no joint pain or swelling Heme: no bleeding, DVT, or easy bruising Endo: no polydipsia or polyuria  BP (!) 110/58   Pulse 67   Ht 5\' 10"  (1.778 m)   Wt 204 lb 9.6 oz (92.8 kg)   SpO2 98%   BMI 29.36 kg/m   PHYSICAL EXAM: GEN:  AO x 3 in no acute distress HEENT: normal Dentition:  Some dental erosion is present Neck: JVP normal. + 1 carotid upstrokes without bruits. No thyromegaly. Lungs: equal expansion, clear bilaterally CV: Apex is discrete and nondisplaced, RRR with 2/6 SEM Abd: soft, non-tender, non-distended; no bruit; positive bowel sounds Ext: no edema, ecchymoses, or cyanosis Vascular: 2+ femoral pulses, 2+ radial pulses       Skin: warm and dry without rash Neuro: CN II-XII grossly intact; motor and sensory grossly intact    DATA AND STUDIES:  EKG: June 2023 Sinus rhythm with occasional PVCs  2D ECHO:  January 2024: 1. Left ventricular ejection fraction, by estimation, is 65 to 70%. The  left ventricle has normal function. The left ventricle has no regional  wall motion abnormalities. There is moderate left ventricular hypertrophy.  Left ventricular diastolic  parameters are indeterminate. Elevated left atrial pressure.   2. Right ventricular systolic function is normal. The right ventricular  size is normal.   3. Left atrial size was mildly dilated.   4. The mitral valve is normal in structure. No evidence of mitral valve  regurgitation. No evidence of mitral stenosis. Severe mitral annular  calcification.   5. The aortic valve has an indeterminant number of cusps. There is  moderate calcification of the aortic valve. There is moderate  thickening  of the aortic valve. Aortic valve regurgitation is mild to moderate.  Severe aortic valve stenosis. Aortic valve  mean gradient measures 44.5 mmHg. Aortic valve peak gradient measures 71.9  mmHg. Aortic valve area, by VTI measures 0.58 cm.   6. The inferior vena cava is dilated in size with >50% respiratory  variability, suggesting right atrial pressure of 8 mmHg.   June 2023:   1. Left ventricular ejection fraction, by estimation, is 60 to 65%. The  left ventricle has normal function. The left ventricle has no regional  wall motion abnormalities. There is moderate concentric left ventricular  hypertrophy. Left ventricular  diastolic parameters are consistent with Grade I diastolic dysfunction  (impaired relaxation).   2. Right ventricular systolic function is normal. The right ventricular  size is normal. Tricuspid regurgitation signal is inadequate for assessing  PA pressure.   3. Left atrial size was mildly dilated.   4. The mitral valve is degenerative. No evidence of mitral valve  regurgitation. Mild mitral stenosis. Severe mitral annular calcification.   5. The aortic valve is calcified. Aortic valve regurgitation is mild.  Severe aortic valve stenosis.   6. The inferior vena cava is dilated in size with <50% respiratory  variability, suggesting right atrial pressure of 15 mmHg.  CARDIAC CATH: 2019: Moderate obstructive disease of the mid right coronary artery with a distal chronic total occlusion collateralized by the left system; high-grade proximal LAD lesion treated with 1 drug-eluting stent  STS RISK CALCULATOR:   Procedure Type: Isolated AVR PERIOPERATIVE OUTCOME ESTIMATE % Operative Mortality 1.58% Morbidity & Mortality 9.31% Stroke 1.23% Renal Failure 1.34% Reoperation 4.39% Prolonged Ventilation 3.01% Deep Sternal Wound Infection 0.041% [place] Hospital Stay (>14 days) 3.81% Short Hospital Stay (<6 days)* 51.1%    NHYA CLASS: 1    ASSESSMENT  AND PLAN:   Severe aortic stenosis - Plan: Exercise Tolerance Test, Pro b natriuretic peptide (BNP)  Coronary artery disease involving native coronary artery of native heart without angina pectoris  Primary hypertension  The patient continues to be asymptomatic with severe aortic stenosis (stage C1).  His echocardiogram demonstrates severe aortic stenosis with preserved LV function.  We talked about potential treatment options.  I would like to objectively assess his exercise capacity and compare that to age-appropriate norms.  I will refer him for an exercise treadmill stress test.  Additionally I will refer him for a BNP.  If this is elevated there is data to suggest that an asymptomatic patient's early aortic valve intervention would be beneficial.  We did speak about surgical aortic valve replacement versus a TAVR procedure.  Given that he cares for his wife he would prefer a TAVR procedure if possible.  I will otherwise see him back in 3 months but if he does not do well on exercise treadmill stress test I we will plan on expediting his evaluation including cardiac catheterization and CT scanning.  I have personally reviewed the patients imaging data as summarized above.  I have reviewed the natural history of aortic stenosis with the patient and family members who are present today. We have discussed the limitations of medical therapy and the poor prognosis associated with symptomatic aortic stenosis. We have also reviewed potential treatment options, including palliative medical therapy, conventional surgical aortic valve replacement, and transcatheter aortic valve replacement. We discussed treatment options in the context of this patient's specific comorbid medical conditions.   All of the patient's questions were answered today. Will make further recommendations based on the results of  studies outlined above.   Total time spent with patient today 50 minutes. This includes reviewing records,  evaluating the patient and coordinating care.   Early Osmond, MD  11/06/2022 8:47 AM    [facility] [address], [place], [place]  [address] Phone: [phone]; Fax: [phone]

## 2022-11-07 LAB — PRO B NATRIURETIC PEPTIDE: NT-Pro BNP: 755 pg/mL — ABNORMAL HIGH (ref 0–486)

## 2022-11-08 ENCOUNTER — Ambulatory Visit: Payer: Medicare Other | Attending: Internal Medicine

## 2022-11-08 DIAGNOSIS — I350 Nonrheumatic aortic (valve) stenosis: Secondary | ICD-10-CM | POA: Diagnosis not present

## 2022-11-08 LAB — EXERCISE TOLERANCE TEST
Angina Index: 0
Duke Treadmill Score: -2
Estimated workload: 2.3
Exercise duration (min): 3 min
Exercise duration (sec): 0 s
MPHR: 145 {beats}/min
Peak HR: 142 {beats}/min
Percent HR: 97 %
RPE: 15
Rest HR: 69 {beats}/min
ST Depression (mm): 1 mm
T Wave inversion (mm): 1 mm

## 2022-11-13 ENCOUNTER — Other Ambulatory Visit: Payer: Self-pay

## 2022-11-13 DIAGNOSIS — I350 Nonrheumatic aortic (valve) stenosis: Secondary | ICD-10-CM

## 2022-11-14 ENCOUNTER — Ambulatory Visit: Payer: Medicare Other | Attending: Internal Medicine

## 2022-11-14 DIAGNOSIS — I350 Nonrheumatic aortic (valve) stenosis: Secondary | ICD-10-CM

## 2022-11-14 LAB — BASIC METABOLIC PANEL
BUN/Creatinine Ratio: 15 (ref 10–24)
BUN: 17 mg/dL (ref 8–27)
CO2: 27 mmol/L (ref 20–29)
Calcium: 9.4 mg/dL (ref 8.6–10.2)
Chloride: 106 mmol/L (ref 96–106)
Creatinine, Ser: 1.17 mg/dL (ref 0.76–1.27)
Glucose: 95 mg/dL (ref 70–99)
Potassium: 4.4 mmol/L (ref 3.5–5.2)
Sodium: 141 mmol/L (ref 134–144)
eGFR: 65 mL/min/{1.73_m2} (ref >59)

## 2022-11-14 LAB — CBC
Hematocrit: 36.6 % — ABNORMAL LOW (ref 37.5–51.0)
Hemoglobin: 12.7 g/dL — ABNORMAL LOW (ref 13.0–17.7)
MCH: 33.2 pg — ABNORMAL HIGH (ref 26.6–33.0)
MCHC: 34.7 g/dL (ref 31.5–35.7)
MCV: 96 fL (ref 79–97)
Platelets: 126 10*3/uL — ABNORMAL LOW (ref 150–450)
RBC: 3.83 x10E6/uL — ABNORMAL LOW (ref 4.14–5.80)
RDW: 12.9 % (ref 11.6–15.4)
WBC: 6.5 10*3/uL (ref 3.4–10.8)

## 2022-11-20 ENCOUNTER — Telehealth: Payer: Self-pay | Admitting: *Deleted

## 2022-11-20 NOTE — Telephone Encounter (Addendum)
Cardiac Catheterization scheduled at [facility] for Thursday November 22, 2022 10:30 AM Arrival time and place: [facility] Main Entrance A at: 8:30 AM  Nothing to eat after midnight prior to procedure, clear liquids until 5 AM day of procedure.  Medication instructions: -Usual morning medications can be taken with sips of water including aspirin 81 mg.  Confirmed patient has responsible adult to drive home post procedure and be with patient first 24 hours after arriving home.  Patient reports no new symptoms concerning for COVID-19 in the past 10 days.  Left detailed voicemail message for pt with instructions (DPR) [phone].

## 2022-11-21 ENCOUNTER — Other Ambulatory Visit: Payer: Self-pay

## 2022-11-21 DIAGNOSIS — I350 Nonrheumatic aortic (valve) stenosis: Secondary | ICD-10-CM

## 2022-11-22 ENCOUNTER — Encounter (HOSPITAL_COMMUNITY): Payer: Self-pay | Admitting: Internal Medicine

## 2022-11-22 ENCOUNTER — Other Ambulatory Visit: Payer: Self-pay

## 2022-11-22 ENCOUNTER — Ambulatory Visit (HOSPITAL_COMMUNITY)
Admission: RE | Admit: 2022-11-22 | Discharge: 2022-11-22 | Disposition: A | Discharge status: Home / Self Care | Payer: Medicare Other | Attending: Internal Medicine | Admitting: Internal Medicine

## 2022-11-22 ENCOUNTER — Encounter (HOSPITAL_COMMUNITY)
Admission: RE | Disposition: A | Discharge status: Home / Self Care | Payer: Self-pay | Source: Home / Self Care | Attending: Internal Medicine

## 2022-11-22 DIAGNOSIS — R0609 Other forms of dyspnea: Secondary | ICD-10-CM | POA: Insufficient documentation

## 2022-11-22 DIAGNOSIS — Z7982 Long term (current) use of aspirin: Secondary | ICD-10-CM | POA: Insufficient documentation

## 2022-11-22 DIAGNOSIS — E785 Hyperlipidemia, unspecified: Secondary | ICD-10-CM | POA: Insufficient documentation

## 2022-11-22 DIAGNOSIS — I2582 Chronic total occlusion of coronary artery: Secondary | ICD-10-CM | POA: Diagnosis not present

## 2022-11-22 DIAGNOSIS — Z87891 Personal history of nicotine dependence: Secondary | ICD-10-CM | POA: Insufficient documentation

## 2022-11-22 DIAGNOSIS — I350 Nonrheumatic aortic (valve) stenosis: Secondary | ICD-10-CM | POA: Insufficient documentation

## 2022-11-22 DIAGNOSIS — D696 Thrombocytopenia, unspecified: Secondary | ICD-10-CM | POA: Diagnosis not present

## 2022-11-22 DIAGNOSIS — I10 Essential (primary) hypertension: Secondary | ICD-10-CM | POA: Diagnosis not present

## 2022-11-22 DIAGNOSIS — I2584 Coronary atherosclerosis due to calcified coronary lesion: Secondary | ICD-10-CM | POA: Diagnosis not present

## 2022-11-22 DIAGNOSIS — I2510 Atherosclerotic heart disease of native coronary artery without angina pectoris: Secondary | ICD-10-CM | POA: Insufficient documentation

## 2022-11-22 HISTORY — PX: RIGHT HEART CATH AND CORONARY ANGIOGRAPHY: CATH118264

## 2022-11-22 HISTORY — PX: INTRAVASCULAR PRESSURE WIRE/FFR STUDY: CATH118243

## 2022-11-22 LAB — POCT I-STAT EG7
Acid-Base Excess: 0 mmol/L (ref 0.0–2.0)
Acid-base deficit: 1 mmol/L (ref 0.0–2.0)
Acid-base deficit: 2 mmol/L (ref 0.0–2.0)
Bicarbonate: 24.2 mmol/L (ref 20.0–28.0)
Bicarbonate: 25.1 mmol/L (ref 20.0–28.0)
Bicarbonate: 25.6 mmol/L (ref 20.0–28.0)
Calcium, Ion: 1.24 mmol/L (ref 1.15–1.40)
Calcium, Ion: 1.24 mmol/L (ref 1.15–1.40)
Calcium, Ion: 1.3 mmol/L (ref 1.15–1.40)
HCT: 34 % — ABNORMAL LOW (ref 39.0–52.0)
HCT: 36 % — ABNORMAL LOW (ref 39.0–52.0)
HCT: 36 % — ABNORMAL LOW (ref 39.0–52.0)
Hemoglobin: 11.6 g/dL — ABNORMAL LOW (ref 13.0–17.0)
Hemoglobin: 12.2 g/dL — ABNORMAL LOW (ref 13.0–17.0)
Hemoglobin: 12.2 g/dL — ABNORMAL LOW (ref 13.0–17.0)
O2 Saturation: 69 %
O2 Saturation: 71 %
O2 Saturation: 72 %
Potassium: 3.7 mmol/L (ref 3.5–5.1)
Potassium: 3.8 mmol/L (ref 3.5–5.1)
Potassium: 4 mmol/L (ref 3.5–5.1)
Sodium: 141 mmol/L (ref 135–145)
Sodium: 142 mmol/L (ref 135–145)
Sodium: 143 mmol/L (ref 135–145)
TCO2: 26 mmol/L (ref 22–32)
TCO2: 26 mmol/L (ref 22–32)
TCO2: 27 mmol/L (ref 22–32)
pCO2, Ven: 45.8 mmHg (ref 44–60)
pCO2, Ven: 46.5 mmHg (ref 44–60)
pCO2, Ven: 47.3 mmHg (ref 44–60)
pH, Ven: 7.332 (ref 7.25–7.43)
pH, Ven: 7.332 (ref 7.25–7.43)
pH, Ven: 7.349 (ref 7.25–7.43)
pO2, Ven: 39 mmHg (ref 32–45)
pO2, Ven: 39 mmHg (ref 32–45)
pO2, Ven: 41 mmHg (ref 32–45)

## 2022-11-22 LAB — POCT I-STAT 7, (LYTES, BLD GAS, ICA,H+H)
Acid-base deficit: 2 mmol/L (ref 0.0–2.0)
Bicarbonate: 23.7 mmol/L (ref 20.0–28.0)
Calcium, Ion: 1.3 mmol/L (ref 1.15–1.40)
HCT: 36 % — ABNORMAL LOW (ref 39.0–52.0)
Hemoglobin: 12.2 g/dL — ABNORMAL LOW (ref 13.0–17.0)
O2 Saturation: 96 %
Potassium: 4 mmol/L (ref 3.5–5.1)
Sodium: 141 mmol/L (ref 135–145)
TCO2: 25 mmol/L (ref 22–32)
pCO2 arterial: 41.4 mmHg (ref 32–48)
pH, Arterial: 7.366 (ref 7.35–7.45)
pO2, Arterial: 82 mmHg — ABNORMAL LOW (ref 83–108)

## 2022-11-22 LAB — POCT ACTIVATED CLOTTING TIME: Activated Clotting Time: 271 seconds

## 2022-11-22 SURGERY — RIGHT HEART CATH AND CORONARY ANGIOGRAPHY
Anesthesia: LOCAL

## 2022-11-22 MED ORDER — MIDAZOLAM HCL 2 MG/2ML IJ SOLN
INTRAMUSCULAR | Status: AC
  Filled 2022-11-22: qty 2

## 2022-11-22 MED ORDER — VERAPAMIL HCL 2.5 MG/ML IV SOLN
INTRAVENOUS | Status: DC | PRN
  Administered 2022-11-22: 10 mL via INTRA_ARTERIAL

## 2022-11-22 MED ORDER — SODIUM CHLORIDE 0.9 % IV SOLN: 250.0000 mL | INTRAVENOUS | Status: DC | PRN

## 2022-11-22 MED ORDER — SODIUM CHLORIDE 0.9 % WEIGHT BASED INFUSION: 1.0000 mL/kg/h | INTRAVENOUS | Status: DC

## 2022-11-22 MED ORDER — VERAPAMIL HCL 2.5 MG/ML IV SOLN
INTRAVENOUS | Status: AC
  Filled 2022-11-22: qty 2

## 2022-11-22 MED ORDER — SODIUM CHLORIDE 0.9% FLUSH: 3.0000 mL | INTRAVENOUS | Status: DC | PRN

## 2022-11-22 MED ORDER — SODIUM CHLORIDE 0.9% FLUSH: 3.0000 mL | Freq: Two times a day (BID) | INTRAVENOUS | Status: DC

## 2022-11-22 MED ORDER — FENTANYL CITRATE (PF) 100 MCG/2ML IJ SOLN
INTRAMUSCULAR | Status: DC | PRN
  Administered 2022-11-22: 25 ug via INTRAVENOUS

## 2022-11-22 MED ORDER — ACETAMINOPHEN 325 MG PO TABS: 650.0000 mg | ORAL_TABLET | ORAL | Status: DC | PRN

## 2022-11-22 MED ORDER — ASPIRIN 81 MG PO CHEW: 81.0000 mg | CHEWABLE_TABLET | ORAL | Status: DC

## 2022-11-22 MED ORDER — SODIUM CHLORIDE 0.9 % IV SOLN: INTRAVENOUS | Status: DC

## 2022-11-22 MED ORDER — LABETALOL HCL 5 MG/ML IV SOLN: 10.0000 mg | INTRAVENOUS | Status: DC | PRN

## 2022-11-22 MED ORDER — HYDRALAZINE HCL 20 MG/ML IJ SOLN: 10.0000 mg | INTRAMUSCULAR | Status: DC | PRN

## 2022-11-22 MED ORDER — HEPARIN SODIUM (PORCINE) 1000 UNIT/ML IJ SOLN
INTRAMUSCULAR | Status: AC
  Filled 2022-11-22: qty 10

## 2022-11-22 MED ORDER — MIDAZOLAM HCL 2 MG/2ML IJ SOLN
INTRAMUSCULAR | Status: DC | PRN
  Administered 2022-11-22: 1 mg via INTRAVENOUS

## 2022-11-22 MED ORDER — SODIUM CHLORIDE 0.9 % WEIGHT BASED INFUSION
3.0000 mL/kg/h | INTRAVENOUS | Status: AC
  Administered 2022-11-22: 3 mL/kg/h via INTRAVENOUS

## 2022-11-22 MED ORDER — ONDANSETRON HCL 4 MG/2ML IJ SOLN: 4.0000 mg | Freq: Four times a day (QID) | INTRAMUSCULAR | Status: DC | PRN

## 2022-11-22 MED ORDER — NITROGLYCERIN 1 MG/10 ML FOR IR/CATH LAB
INTRA_ARTERIAL | Status: DC | PRN
  Administered 2022-11-22: 100 ug via INTRACORONARY

## 2022-11-22 MED ORDER — LIDOCAINE HCL (PF) 1 % IJ SOLN
INTRAMUSCULAR | Status: AC
  Filled 2022-11-22: qty 30

## 2022-11-22 MED ORDER — NITROGLYCERIN 1 MG/10 ML FOR IR/CATH LAB
INTRA_ARTERIAL | Status: AC
  Filled 2022-11-22: qty 10

## 2022-11-22 MED ORDER — LIDOCAINE HCL (PF) 1 % IJ SOLN
INTRAMUSCULAR | Status: DC | PRN
  Administered 2022-11-22 (×2): 2 mL

## 2022-11-22 MED ORDER — FENTANYL CITRATE (PF) 100 MCG/2ML IJ SOLN
INTRAMUSCULAR | Status: AC
  Filled 2022-11-22: qty 2

## 2022-11-22 MED ORDER — HEPARIN SODIUM (PORCINE) 1000 UNIT/ML IJ SOLN
INTRAMUSCULAR | Status: DC | PRN
  Administered 2022-11-22: 5000 [IU] via INTRAVENOUS
  Administered 2022-11-22: 4000 [IU] via INTRAVENOUS

## 2022-11-22 MED ORDER — IOHEXOL 350 MG/ML SOLN
INTRAVENOUS | Status: DC | PRN
  Administered 2022-11-22: 110 mL

## 2022-11-22 MED ORDER — HEPARIN (PORCINE) IN NACL 1000-0.9 UT/500ML-% IV SOLN
INTRAVENOUS | Status: DC | PRN
  Administered 2022-11-22 (×2): 500 mL

## 2022-11-22 SURGICAL SUPPLY — 12 items
CATH BALLN WEDGE 5F 110CM (CATHETERS) IMPLANT
CATH LAUNCHER 6FR EBU3.5 (CATHETERS) IMPLANT
CATH OPTITORQUE TIG 4.0 6F (CATHETERS) IMPLANT
DEVICE RAD COMP TR BAND LRG (VASCULAR PRODUCTS) IMPLANT
GLIDESHEATH SLEND SS 6F .021 (SHEATH) IMPLANT
GUIDEWIRE PRESSURE X 175 (WIRE) IMPLANT
KIT HEART LEFT (KITS) ×1 IMPLANT
PACK CARDIAC CATHETERIZATION (CUSTOM PROCEDURE TRAY) ×1 IMPLANT
SHEATH GLIDE SLENDER 4/5FR (SHEATH) IMPLANT
TRANSDUCER W/STOPCOCK (MISCELLANEOUS) ×1 IMPLANT
TUBING CIL FLEX 10 FLL-RA (TUBING) ×1 IMPLANT
WIRE EMERALD 3MM-J .035X260CM (WIRE) IMPLANT

## 2022-11-22 NOTE — Progress Notes (Signed)
TR BAND REMOVAL  LOCATION:    right radial  DEFLATED PER PROTOCOL:    Yes.    TIME BAND OFF / DRESSING APPLIED:    1240 gauze dressing applied   SITE UPON ARRIVAL:    Level 0  SITE AFTER BAND REMOVAL:    Level 0  CIRCULATION SENSATION AND MOVEMENT:    Within Normal Limits   Yes.    COMMENTS:   no new issues noted

## 2022-11-22 NOTE — Discharge Instructions (Signed)

## 2022-11-22 NOTE — Progress Notes (Signed)
While attempting to remove air from TR band 1cc pulled, site bled, 1cc returned bleeding stopped. Will resume in 15 min

## 2022-11-22 NOTE — Interval H&P Note (Signed)
History and Physical Interval Note:  11/22/2022 8:29 AM  Romie Levee  has presented today for surgery, with the diagnosis of aortic stenosis.  The various methods of treatment have been discussed with the patient and family. After consideration of risks, benefits and other options for treatment, the patient has consented to  Procedure(s): RIGHT/LEFT HEART CATH AND CORONARY ANGIOGRAPHY (N/A) as a surgical intervention.  The patient's history has been reviewed, patient examined, no change in status, stable for surgery.  I have reviewed the patient's chart and labs.  Questions were answered to the patient's satisfaction.     Early Osmond

## 2022-11-23 ENCOUNTER — Ambulatory Visit: Payer: Medicare Other | Admitting: Gastroenterology

## 2022-11-26 ENCOUNTER — Encounter (HOSPITAL_COMMUNITY): Payer: Self-pay | Admitting: Internal Medicine

## 2022-11-30 ENCOUNTER — Ambulatory Visit (HOSPITAL_COMMUNITY)
Admission: RE | Admit: 2022-11-30 | Discharge: 2022-11-30 | Disposition: A | Discharge status: Home / Self Care | Payer: Medicare Other | Source: Ambulatory Visit | Attending: Internal Medicine | Admitting: Internal Medicine

## 2022-11-30 DIAGNOSIS — I700 Atherosclerosis of aorta: Secondary | ICD-10-CM | POA: Diagnosis not present

## 2022-11-30 DIAGNOSIS — Z01810 Encounter for preprocedural cardiovascular examination: Secondary | ICD-10-CM | POA: Diagnosis not present

## 2022-11-30 DIAGNOSIS — I350 Nonrheumatic aortic (valve) stenosis: Secondary | ICD-10-CM | POA: Insufficient documentation

## 2022-11-30 DIAGNOSIS — Z01818 Encounter for other preprocedural examination: Secondary | ICD-10-CM | POA: Diagnosis not present

## 2022-11-30 MED ORDER — IOHEXOL 350 MG/ML SOLN
100.0000 mL | Freq: Once | INTRAVENOUS | Status: AC | PRN
  Administered 2022-11-30: 100 mL via INTRAVENOUS

## 2022-12-03 ENCOUNTER — Other Ambulatory Visit: Payer: Self-pay

## 2022-12-03 DIAGNOSIS — R591 Generalized enlarged lymph nodes: Secondary | ICD-10-CM

## 2022-12-03 DIAGNOSIS — R918 Other nonspecific abnormal finding of lung field: Secondary | ICD-10-CM

## 2022-12-03 DIAGNOSIS — R911 Solitary pulmonary nodule: Secondary | ICD-10-CM

## 2022-12-03 DIAGNOSIS — I350 Nonrheumatic aortic (valve) stenosis: Secondary | ICD-10-CM

## 2022-12-03 DIAGNOSIS — C3490 Malignant neoplasm of unspecified part of unspecified bronchus or lung: Secondary | ICD-10-CM

## 2022-12-03 NOTE — Progress Notes (Signed)
PET scan and MRI brain order per verbal order from Dr. Delton Coombes

## 2022-12-06 ENCOUNTER — Encounter (HOSPITAL_COMMUNITY)
Admission: RE | Admit: 2022-12-06 | Discharge: 2022-12-06 | Disposition: A | Discharge status: Home / Self Care | Payer: Medicare Other | Source: Ambulatory Visit | Attending: Hematology | Admitting: Hematology

## 2022-12-06 DIAGNOSIS — C3490 Malignant neoplasm of unspecified part of unspecified bronchus or lung: Secondary | ICD-10-CM | POA: Insufficient documentation

## 2022-12-06 DIAGNOSIS — R918 Other nonspecific abnormal finding of lung field: Secondary | ICD-10-CM | POA: Diagnosis not present

## 2022-12-06 MED ORDER — FLUDEOXYGLUCOSE F - 18 (FDG) INJECTION
10.5200 | Freq: Once | INTRAVENOUS | Status: AC | PRN
  Administered 2022-12-06: 10.52 via INTRAVENOUS

## 2022-12-10 ENCOUNTER — Encounter: Payer: Medicare Other | Admitting: Thoracic Surgery (Cardiothoracic Vascular Surgery)

## 2022-12-10 ENCOUNTER — Telehealth: Payer: Self-pay | Admitting: Internal Medicine

## 2022-12-10 NOTE — Telephone Encounter (Signed)
Patient is calling to talk with Dr. Ali Lowe or nurse

## 2022-12-10 NOTE — Telephone Encounter (Signed)
I spoke with the pt in regards to his symptoms.  The pt denies syncope and presyncope at this time and was advised to call me if he develops any of these symptoms. The pt is scheduled for evaluation with oncology on Wednesday to follow up on incidental findings on TAVR CT scan and PET scan.

## 2022-12-10 NOTE — Telephone Encounter (Signed)
Spoke w patient.  He said he is calling to report "when I walk a whole lot I have to sit down and rest and am fatigued"  this is with some of the activities but not all.  His CTs were completed w follow up due PET due to incidental findings which has also been completed.    I let [name] know I will forward his message to Dr. Ali Lowe make aware and to Lauren for review and follow up plans.

## 2022-12-11 NOTE — Progress Notes (Signed)
[facility] [address], [place] [address]   Clinic Day:  12/12/2022  Referring physician: Redmond School, MD  Patient Care Team: Redmond School, MD as PCP - General (Internal Medicine) Early Osmond, MD as PCP - Cardiology (Cardiology) Gala Romney Cristopher Estimable, MD as Consulting Physician (Gastroenterology) Derek Jack, MD as Medical Oncologist (Medical Oncology) Brien Mates, RN as Oncology Nurse Navigator (Medical Oncology)   ASSESSMENT & PLAN:   Assessment:  1.  Bilateral lung masses: - CT scan was ordered prior to TAVR procedure - CT angiogram (11/30/2022): Right upper lobe lung nodule 3.7 x 2.2 x 2.6 cm, right lower lobe lung nodule 2.3 x 2.1 cm and left upper lobe lung nodule 1.2 x 1.2 cm.  Several small subcentimeter pulmonary nodules noted.  Largest of these nodules are macrolobulated and spiculated margins with slightly indistinct borders.  Mildly enlarged right hilar lymph node measuring 1.5 cm.  No other pathologically enlarged mediastinal and left hilar lymph nodes. - PET scan (12/06/2022): Right apical mass 3.1 x 2.0 cm, SUV 7.7.  1.5 x 1 cm left upper lobe mass, SUV 6.5.  2.3 cm right lower lobe nodule SUV 4.9.  Prominent right lateral lymph node is not FDG avid.  No evidence of metastatic disease in the abdomen or pelvis. - MRI brain (12/12/2022): No evidence of intracranial metastatic disease.  Unchanged 5 mm pituitary lesion compatible with cyst. - He lost 5 pounds in the last 6 months.  Food has not been tasting good since he quit smoking.  Denies any hemoptysis or chest pain.  2.  Social/family history: - He lives at home with his wife.  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  He quit smoking 5 years ago.  He smoked 1 and half pack per day for 55 years.  He has used various chemicals on his farm including fatty alcohol, sucker plucker.  Denied any exposure to  asbestos. - No family history of malignancies.  Plan:  1.  PET positive bilateral lung masses: - We reviewed images of the PET scan and CT scan with the patient and his son in detail. - These are highly suggestive of lung cancer. - Recommend MRI of the brain for staging. - Recommend bronchoscopy and biopsy.  Will make a referral to Drs. Icard/Byrum. - RTC 1 week after biopsy.   No orders of the defined types were placed in this encounter.     I,Alexis Herring,acting as a Education administrator for [employer], MD.,have documented all relevant documentation on the behalf of Derek Jack, MD,as directed by  Derek Jack, MD while in the presence of Derek Jack, MD.   I, Derek Jack MD, have reviewed the above documentation for accuracy and completeness, and I agree with the above.   Derek Jack, MD   3/6/20245:53 PM  CHIEF COMPLAINT/PURPOSE OF CONSULT:   Diagnosis: PET positive bilateral lung nodules   Cancer Staging  No matching staging information was found for the patient.   Prior Therapy: None  Current Therapy: Under workup   HISTORY OF PRESENT ILLNESS:   Oncology History   No history exists.      Jerry Roach is a 77 y.o. male presenting to clinic today for evaluation of bilateral lung masses at the request of Dr. Lenna Sciara. He had a right heart cath on 11/22/22 and a CT scan for TAVR planning- The CT angio chest  on 11/30/22 incidentally revealed: Multiple bilateral pulmonary nodules and masses highly concerning for malignancy. His NM PET scan on 12/06/22. did not reveal any evidence of metastatic disease in the abdomen/pelvis.  Today, he states that he is doing well overall. His appetite level is at 100%. His energy level is at 75%. He denies any cough, hemoptysis or chest pains.  He quit smoking 5 years ago.  He lives at home with his wife and is independent of ADLs and IADLs.  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   PAST MEDICAL HISTORY:   Past Medical History: Past Medical History:  Diagnosis Date   CAD (coronary artery disease)    a. 12/2017: s/p NSTEMI with DES to Proximal LAD. CTO of RCA with L--> R collaterals noted.    Former smoker    HTN (hypertension)    Hypercholesteremia    Nocturia    Severe aortic stenosis    Thrombocytopenia (HCC)    Mild   Weak urinary stream     Surgical History: Past Surgical History:  Procedure Laterality Date   CATARACT EXTRACTION W/PHACO Left 03/03/2015   Procedure: CATARACT EXTRACTION PHACO AND INTRAOCULAR LENS PLACEMENT (IOC);  Surgeon: Baruch Goldmann, MD;  Location: [facility];  Service: Ophthalmology;  Laterality: Left;  CDE 6.86   CATARACT EXTRACTION W/PHACO Right 08/31/2015   Procedure: CATARACT EXTRACTION PHACO AND INTRAOCULAR LENS PLACEMENT RIGHT EYE CDE=4.08;  Surgeon: Baruch Goldmann, MD;  Location: [facility];  Service: Ophthalmology;  Laterality: Right;   COLONOSCOPY  11/05/2011   Procedure: COLONOSCOPY;  Surgeon: Daneil Dolin, MD;  Location: [facility];  Service: Endoscopy;  Laterality: N/A;  7:30 AM   CORONARY STENT INTERVENTION N/A 12/27/2017   Procedure: CORONARY STENT INTERVENTION;  Surgeon: Nelva Bush, MD;  Location: [facility];  Service: Cardiovascular;  Laterality: N/A;   INTRAVASCULAR PRESSURE WIRE/FFR STUDY N/A 11/22/2022   Procedure: INTRAVASCULAR PRESSURE WIRE/FFR STUDY;  Surgeon: Early Osmond, MD;  Location: [facility];  Service: Cardiovascular;  Laterality: N/A;   LEFT HEART CATH AND CORONARY ANGIOGRAPHY N/A 12/27/2017   Procedure: LEFT HEART CATH AND CORONARY ANGIOGRAPHY;  Surgeon: Nelva Bush, MD;  Location: [facility];  Service: Cardiovascular;  Laterality: N/A;   NO PAST SURGERIES     RIGHT HEART CATH AND CORONARY ANGIOGRAPHY N/A 11/22/2022   Procedure: RIGHT HEART CATH AND CORONARY ANGIOGRAPHY;  Surgeon: Early Osmond, MD;  Location: [facility];  Service: Cardiovascular;   Laterality: N/A;    Social History: Social History   Socioeconomic History   Marital status: Married    Spouse name: Not on file   Number of children: Not on file   Years of education: Not on file   Highest education level: Not on file  Occupational History   Not on file  Tobacco Use   Smoking status: Former    Packs/day: 1.50    Years: 39.00    Total pack years: 58.50    Types: Cigarettes    Quit date: 12/26/2017    Years since quitting: 4.9   Smokeless tobacco: Never  Vaping Use   Vaping Use: Never used  Substance and Sexual Activity   Alcohol use: No   Drug use: No   Sexual activity: Yes    Birth control/protection: None  Other Topics Concern   Not on file  Social History Narrative   Not on file   Social Determinants of Health  Financial Resource Strain: Low Risk  (01/26/2021)   Overall Financial Resource Strain (CARDIA)    Difficulty of Paying Living Expenses: Not hard at all  Food Insecurity: No Food Insecurity (12/12/2022)   Hunger Vital Sign    Worried About Running Out of Food in the Last Year: Never true    Ran Out of Food in the Last Year: Never true  Transportation Needs: No Transportation Needs (12/12/2022)   PRAPARE - Hydrologist (Medical): No    Lack of Transportation (Non-Medical): No  Physical Activity: Sufficiently Active (01/26/2021)   Exercise Vital Sign    Days of Exercise per Week: 5 days    Minutes of Exercise per Session: 50 min  Stress: No Stress Concern Present (01/26/2021)   [place]    Feeling of Stress : Not at all  Social Connections: [place] (01/26/2021)   Social Connection and Isolation Panel [NHANES]    Frequency of Communication with Friends and Family: More than three times a week    Frequency of Social Gatherings with Friends and Family: More than three times a week    Attends Religious Services: 1 to 4 times per year     Active Member of [employer] or Organizations: No    Attends Music therapist: 1 to 4 times per year    Marital Status: Married  Human resources officer Violence: Not At Risk (12/12/2022)   Humiliation, Afraid, Rape, and Kick questionnaire    Fear of Current or Ex-Partner: No    Emotionally Abused: No    Physically Abused: No    Sexually Abused: No    Family History: Family History  Problem Relation Age of Onset   Hypertension Father    COPD Mother    Hypertension Sister    COPD Sister    COPD Brother    Colon cancer Neg Hx     Current Medications:  Current Outpatient Medications:    acetaminophen (TYLENOL) 325 MG tablet, Take 2 tablets (650 mg total) by mouth every 6 (six) hours as needed for mild pain or headache., Disp: , Rfl:    alfuzosin (UROXATRAL) 10 MG 24 hr tablet, Take 1 tablet (10 mg total) by mouth daily with breakfast., Disp: 90 tablet, Rfl: 3   aspirin EC 81 MG EC tablet, Take 1 tablet (81 mg total) by mouth daily., Disp: , Rfl:    atorvastatin (LIPITOR) 80 MG tablet, TAKE 1 TABLET(80 MG) BY MOUTH DAILY AT 6 PM, Disp: 60 tablet, Rfl: 1   Carboxymethylcellulose Sodium (EYE DROPS OP), Place 1 drop into both eyes daily as needed (gummy eyes)., Disp: , Rfl:    Flaxseed, Linseed, (FLAX SEED OIL PO), Take 1,200 mg by mouth daily., Disp: , Rfl:    hydrocortisone (ANUSOL-HC) 25 MG suppository, Place 25 mg rectally 2 (two) times daily as needed (Help use the bathroom)., Disp: , Rfl:    Multiple Vitamins-Minerals (MULTIVITAMIN WITH MINERALS) tablet, Take 1 tablet by mouth daily. Centrum silver, Disp: , Rfl:    nitroGLYCERIN (NITROSTAT) 0.4 MG SL tablet, Place 1 tablet (0.4 mg total) under the tongue every 5 (five) minutes x 3 doses as needed for chest pain., Disp: 25 tablet, Rfl: 3   oxymetazoline (AFRIN) 0.05 % nasal spray, Place 1 spray into both nostrils daily as needed for congestion., Disp: , Rfl:    Allergies: No Known Allergies  REVIEW OF SYSTEMS:   Review of Systems   Constitutional:  Negative for chills, fatigue and fever.  HENT:   Negative for lump/mass, mouth sores, nosebleeds, sore throat and trouble swallowing.   Eyes:  Negative for eye problems.  Respiratory:  Positive for shortness of breath (On exertion). Negative for cough.   Cardiovascular:  Negative for chest pain, leg swelling and palpitations.  Gastrointestinal:  Negative for abdominal pain, constipation, diarrhea, nausea and vomiting.  Genitourinary:  Negative for bladder incontinence, difficulty urinating, dysuria, frequency, hematuria and nocturia.   Musculoskeletal:  Negative for arthralgias, back pain, flank pain, myalgias and neck pain.  Skin:  Negative for itching and rash.  Neurological:  Negative for dizziness, headaches and numbness.  Hematological:  Does not bruise/bleed easily.  Psychiatric/Behavioral:  Negative for depression, sleep disturbance and suicidal ideas. The patient is not nervous/anxious.   All other systems reviewed and are negative.    VITALS:   Blood pressure (!) 103/58, pulse 71, temperature 98 F (36.7 C), temperature source Oral, resp. rate 18, height 5' 10.5" (1.791 m), weight 199 lb 11.2 oz (90.6 kg), SpO2 99 %.  Wt Readings from Last 3 Encounters:  12/12/22 199 lb 11.2 oz (90.6 kg)  11/22/22 200 lb (90.7 kg)  11/06/22 204 lb 9.6 oz (92.8 kg)    Body mass index is 28.25 kg/m.  Performance status (ECOG): 1 - Symptomatic but completely ambulatory  PHYSICAL EXAM:   Physical Exam Vitals and nursing note reviewed. Exam conducted with a chaperone present.  Constitutional:      Appearance: Normal appearance.  Cardiovascular:     Rate and Rhythm: Normal rate and regular rhythm.     Pulses: Normal pulses.     Heart sounds: Normal heart sounds.  Pulmonary:     Effort: Pulmonary effort is normal.     Breath sounds: Normal breath sounds.  Abdominal:     Palpations: Abdomen is soft. There is no hepatomegaly, splenomegaly or mass.     Tenderness: There  is no abdominal tenderness.  Musculoskeletal:     Right lower leg: No edema.     Left lower leg: No edema.  Lymphadenopathy:     Cervical: No cervical adenopathy.     Right cervical: No superficial, deep or posterior cervical adenopathy.    Left cervical: No superficial, deep or posterior cervical adenopathy.     Upper Body:     Right upper body: No supraclavicular or axillary adenopathy.     Left upper body: No supraclavicular or axillary adenopathy.  Neurological:     General: No focal deficit present.     Mental Status: He is alert and oriented to person, place, and time.  Psychiatric:        Mood and Affect: Mood normal.        Behavior: Behavior normal.     LABS:      Latest Ref Rng & Units 11/22/2022    9:53 AM 11/22/2022    9:47 AM 11/22/2022    9:44 AM  CBC  Hemoglobin 13.0 - 17.0 g/dL 11.6  12.2  12.2   Hematocrit 39.0 - 52.0 % 34.0  36.0  36.0       Latest Ref Rng & Units 11/22/2022    9:53 AM 11/22/2022    9:47 AM 11/22/2022    9:44 AM  CMP  Sodium 135 - 145 mmol/L 141  143  142   Potassium 3.5 - 5.1 mmol/L 3.7  3.8  4.0      No results found for: "CEA1", "CEA" / No results found for: "  CEA1", "CEA" Lab Results  Component Value Date   PSA1 0.6 12/27/2021   No results found for: "[id number]" No results found for: "CAN125"  No results found for: "TOTALPROTELP", "ALBUMINELP", "A1GS", "A2GS", "BETS", "BETA2SER", "GAMS", "MSPIKE", "SPEI" No results found for: "TIBC", "FERRITIN", "IRONPCTSAT" Lab Results  Component Value Date   LDH 150 02/26/2022   LDH 135 08/11/2021     STUDIES:   MR Brain W Wo Contrast  Result Date: 12/12/2022 CLINICAL DATA:  Non-small cell lung cancer staging. EXAM: MRI HEAD WITHOUT AND WITH CONTRAST TECHNIQUE: Multiplanar, multiecho pulse sequences of the brain and surrounding structures were obtained without and with intravenous contrast. CONTRAST:  63m GADAVIST GADOBUTROL 1 MMOL/ML IV SOLN COMPARISON:  Trigeminal protocol MRI 08/31/2019  FINDINGS: Brain: There is no evidence of an acute infarct, intracranial hemorrhage, midline shift, or extra-axial fluid collection. Scattered small T2 hyperintensities in the cerebral white matter bilaterally are nonspecific but compatible with mild chronic small vessel ischemic disease. Small chronic cortical infarcts are noted in the posterior right temporal lobe and left parietal lobe, and there are also small chronic bilateral cerebellar infarcts. There is mild generalized cerebral atrophy. No abnormal brain parenchymal or meningeal enhancement is seen to suggest metastatic disease. A 5 mm T2 hyperintense lesion anteriorly in the pituitary gland without appreciable enhancement is unchanged. Vascular: Major intracranial vascular flow voids are preserved. Skull and upper cervical spine: Unremarkable bone marrow signal. Sinuses/Orbits: Bilateral cataract extraction. Mild mucosal thickening and possible minimal fluid in the left maxillary sinus. Clear mastoid air cells. Other: None. IMPRESSION: 1. No evidence of intracranial metastases. 2. Mild chronic small vessel ischemic disease with a few small chronic cerebral and cerebellar infarcts. 3. Unchanged 5 mm pituitary lesion most compatible with a cyst or other benign etiology. Electronically Signed   By: ALogan BoresM.D.   On: 12/12/2022 15:21   NM PET Image Initial (PI) Skull Base To Thigh  Result Date: 12/07/2022 CLINICAL DATA:  Initial treatment strategy for multiple pulmonary nodules/masses. EXAM: NUCLEAR MEDICINE PET SKULL BASE TO THIGH TECHNIQUE: 10.5 mCi F-18 FDG was injected intravenously. Full-ring PET imaging was performed from the skull base to thigh after the radiotracer. CT data was obtained and used for attenuation correction and anatomic localization. Fasting blood glucose: 99 mg/dl COMPARISON:  CTA chest abdomen pelvis dated 11/30/2022. Partial comparison to MRI abdomen dated 09/20/2021. FINDINGS: Mediastinal blood pool activity: SUV max 2.9  Liver activity: SUV max NA NECK: No hypermetabolic cervical lymphadenopathy. Incidental CT findings: None. CHEST: 3.1 x 2.0 cm spiculated right apical mass (series 7/image 22), max SUV 7.7, suspicious for primary bronchogenic neoplasm. 1.5 x 1.0 cm irregular anterior left upper lobe nodule (series 7/image 46), max SUV 6.5, suspicious for metachronous primary bronchogenic neoplasm or metastasis. 2.3 cm right lower lobe nodule (series 7/image 71), motion degraded, max SUV 4.9. This is likely new from MRI abdomen dated 09/20/2021. Differential considerations include metastasis versus primary bronchogenic neoplasm. Additional small left upper lobe nodules are beneath the size threshold for PET sensitivity. Mild centrilobular emphysematous changes. Trace bilateral pleural effusions. The right hilar node noted on recent CT is non FDG avid above the level of blood pool, max SUV 2.8. Mild right infrahilar hypermetabolism, max SUV 3.6, possibly corresponding to a 6 mm short axis inter lobar right lower lobe node on prior CTA chest (series 7/image 71), although equivocal. Incidental CT findings: Prosthetic aortic valve. Atherosclerotic calcifications of the aortic arch. Moderate three-vessel coronary atherosclerosis. ABDOMEN/PELVIS: No abnormal hypermetabolism in the liver, spleen, pancreas,  or adrenal glands. No hypermetabolic abdominopelvic lymphadenopathy. Incidental CT findings: Atherosclerotic calcifications the abdominal aorta and branch vessels. Prostatomegaly, indenting the base of the bladder, suggesting BPH. SKELETON: No focal hypermetabolic activity to suggest skeletal metastasis. Incidental CT findings: Degenerative changes of the visualized thoracolumbar spine. IMPRESSION: Multiple hypermetabolic bilateral pulmonary nodules/masses, as above, suspicious for primary bronchogenic neoplasm versus metastasis. Prominent right hilar node from recent CTA chest is non FDG avid. Mild right infrahilar hypermetabolism is  present, although equivocal. No evidence of metastatic disease in the abdomen/pelvis. Electronically Signed   By: Julian Hy M.D.   On: 12/07/2022 23:34   CT CORONARY MORPH W/CTA COR W/SCORE W/CA W/CM &/OR WO/CM  Addendum Date: 12/01/2022   ADDENDUM REPORT: 12/01/2022 15:43 CLINICAL DATA:  Aortic valve replacement (TAVR), pre-op eval EXAM: Cardiac TAVR CT TECHNIQUE: The patient was scanned on a Siemens Force [id number] slice scanner. A 120 kV retrospective scan was triggered in the descending thoracic aorta at 111 HU's. Gantry rotation speed was 270 msecs and collimation was .9 mm. The 3D data set was reconstructed in 5% intervals of the R-R cycle. Systolic and diastolic phases were analyzed on a dedicated work station using MPR, MIP and VRT modes. The patient received 134m OMNIPAQUE IOHEXOL 350 MG/ML SOLN of contrast. FINDINGS: Aortic Valve: Tricuspid aortic valve with severely reduced cusp excursion. Severely thickened and severely calcified aortic valve cusps. AV calcium score: 3029 Virtual Basal Annulus Measurements: Maximum/Minimum Diameter: 32.2 x 23.0 mm Perimeter: 87.3 mm Area:  573 mm2 No significant LVOT calcifications. Membranous septal length: 4.8 mm Based on these measurements, the annulus would be suitable for a 29 mm Sapien 3 valve. Alternatively, Heart Team can consider 34 mm Evolut valve. Recommend Heart Team discussion for valve selection. Sinus of Valsalva Measurements: Non-coronary:  33 mm Right - coronary:  32 mm Left - coronary:  34 mm Sinus of Valsalva Height: Left: 25.2 mm Right: 26.1 mm Aorta: Conventional 3 vessel branch pattern of aortic arch. Sinotubular Junction:  29 mm Ascending Thoracic Aorta:  32 mm Aortic Arch:  27 mm Descending Thoracic Aorta:  27 mm Coronary Artery Height above Annulus: Left main: 18.5 mm Right coronary: 23 mm Coronary Arteries: Normal coronary origin. Right dominance. The study was performed without use of NTG and insufficient for plaque evaluation. Prior  revascularization. Optimum Fluoroscopic Angle for Delivery: RAO 4 CRA 5 OTHER: Atria: Mild biatrial chamber dilation. Left atrial appendage: No thrombus. Mitral valve: Grossly normal, mild mitral annular calcifications. Pulmonary artery: Normal caliber. Pulmonary veins: Normal anatomy. IMPRESSION: 1. Tricuspid aortic valve with severely reduced cusp excursion. Severely thickened and severely calcified aortic valve cusps. 2. Aortic valve calcium score: 3029 3. Annulus area: 573 mm2, suitable for 29 mm Sapien 3 valve. No LVOT calcifications. Membranous septal length 4.8 mm. 4. Sufficient coronary artery heights from annulus. 5. Optimum fluoroscopic angle for delivery: RAO 4 CRA 5 Electronically Signed   By: GCherlynn KaiserM.D.   On: 12/01/2022 15:43   Result Date: 12/01/2022 EXAM: OVER-READ INTERPRETATION  CT CHEST The following report is a limited chest CT over-read performed by radiologist Dr. DVinnie Langtonof [facility]on 11/30/2022. This over-read does not include interpretation of cardiac or coronary anatomy or pathology. The coronary calcium score and cardiac CTA interpretation by the cardiologist is attached. COMPARISON:  None Available. FINDINGS: Extracardiac findings will be described separately under dictation for contemporaneously obtained CTA chest, abdomen and pelvis dated 11/30/2021. IMPRESSION: Please see separate dictation for contemporaneously obtained CTA of the chest,  abdomen and pelvis for full description of relevant extracardiac findings. Electronically Signed: By: Vinnie Langton M.D. On: 12/01/2022 05:03   CT ANGIO ABDOMEN PELVIS  W &/OR WO CONTRAST  Result Date: 12/01/2022 CLINICAL DATA:  77 year old male with history of severe aortic stenosis. Preprocedural study prior to potential transcatheter aortic valve replacement (TAVR). EXAM: CT ANGIOGRAPHY CHEST, ABDOMEN AND PELVIS TECHNIQUE: Multidetector CT imaging through the chest, abdomen and pelvis was performed using  the standard protocol during bolus administration of intravenous contrast. Multiplanar reconstructed images and MIPs were obtained and reviewed to evaluate the vascular anatomy. RADIATION DOSE REDUCTION: This exam was performed according to the departmental dose-optimization program which includes automated exposure control, adjustment of the mA and/or kV according to patient size and/or use of iterative reconstruction technique. CONTRAST:  170m OMNIPAQUE IOHEXOL 350 MG/ML SOLN COMPARISON:  No priors. FINDINGS: CTA CHEST FINDINGS Cardiovascular: Heart size is normal, however, there appears to be concentric left ventricular hypertrophy. There is no significant pericardial fluid, thickening or pericardial calcification. There is aortic atherosclerosis, as well as atherosclerosis of the great vessels of the mediastinum and the coronary arteries, including calcified atherosclerotic plaque in the left main, left anterior descending, left circumflex and right coronary arteries. Severe thickening and calcification of the aortic valve. Mild mitral annular calcifications. Mediastinum/Lymph Nodes: Mildly enlarged right hilar lymph node (axial image 63 of series 7) measuring 1.5 cm in short axis. No other pathologically enlarged mediastinal or left hilar lymph nodes are noted. Esophagus is unremarkable in appearance. No axillary lymphadenopathy. Lungs/Pleura: Several aggressive appearing pulmonary nodules and masses are noted. The largest of these is in the apex of the right upper lobe (axial image 36 of series 8 and coronal image 78 of series 9) measuring 3.7 x 2.2 x 2.6 cm. Right lower lobe pulmonary nodule measuring 2.3 x 2.1 cm (axial image 86 of series 8) and left upper lobe pulmonary nodule (axial image 56 of series 8) measuring 1.2 x 1.2 cm also noted. Several smaller subcentimeter pulmonary nodules are also noted. The largest of these pulmonary nodules and masses all demonstrate macrolobulated and spiculated margins  with slightly indistinct borders, overall very aggressive in appearance. No acute consolidative airspace disease. Trace bilateral pleural effusions lying dependently. Musculoskeletal/Soft Tissues: There are no aggressive appearing lytic or blastic lesions noted in the visualized portions of the skeleton. CTA ABDOMEN AND PELVIS FINDINGS Hepatobiliary: No suspicious cystic or solid hepatic lesions. No intra or extrahepatic biliary ductal dilatation. Gallbladder is unremarkable in appearance. Pancreas: No pancreatic mass. No pancreatic ductal dilatation. No pancreatic or peripancreatic fluid collections or inflammatory changes. Spleen: Unremarkable. Adrenals/Urinary Tract: Bilateral kidneys and bilateral adrenal glands are normal in appearance. No hydroureteronephrosis. Urinary bladder is unremarkable in appearance. Stomach/Bowel: The appearance of the stomach is unremarkable. There is no pathologic dilatation of small bowel or colon. Normal appendix. Vascular/Lymphatic: Aortic atherosclerosis, with vascular findings and measurements pertinent to potential TAVR procedure, as detailed below. No aneurysm or dissection noted in the abdominal or pelvic vasculature. No lymphadenopathy noted in the abdomen or pelvis. Reproductive: Severe median lobe hypertrophy of the prostate gland. Seminal vesicles are unremarkable in appearance. Other: No significant volume of ascites.  No pneumoperitoneum. Musculoskeletal: There are no aggressive appearing lytic or blastic lesions noted in the visualized portions of the skeleton. VASCULAR MEASUREMENTS PERTINENT TO TAVR: AORTA: Minimal Aortic Diameter-16 x 15 mm Severity of Aortic Calcification-moderate to severe RIGHT PELVIS: Right Common Iliac Artery - Minimal Diameter-8.7 x 7.3 mm Tortuosity-mild Calcification-moderate Right External Iliac Artery -  Minimal Diameter-8.0 x 7.8 mm Tortuosity-mild Calcification-mild Right Common Femoral Artery - Minimal Diameter-8.2 x 7.5 mm  Tortuosity-mild Calcification-mild LEFT PELVIS: Left Common Iliac Artery - Minimal Diameter-9.7 x 9.4 mm Tortuosity-mild Calcification-moderate Left External Iliac Artery - Minimal Diameter-7.4 x 7.5 mm Tortuosity-mild Calcification-mild Left Common Femoral Artery - Minimal Diameter-6.8 x 8.6 mm Tortuosity-mild Calcification-mild Review of the MIP images confirms the above findings. IMPRESSION: 1. Vascular findings and measurements pertinent to potential TAVR procedure, as detailed above. 2. Severe thickening calcification of the aortic valve, compatible with reported clinical history of severe aortic stenosis. 3. Multiple bilateral pulmonary nodules and masses highly concerning for malignancy. Whether this represents multicentric primary bronchogenic malignancy or metastatic disease the chest is uncertain (metachronous bronchogenic malignancies are favored). Mildly enlarged right hilar lymph node suspicious for metastatic lymphadenopathy. While there are no definitive findings to suggest metastatic disease in the abdomen or pelvis on today's examination, further evaluation with PET-CT should be considered in the near future for staging purposes. 4. Aortic atherosclerosis, in addition to left main and three-vessel coronary artery disease. Assessment for potential risk factor modification, dietary therapy or pharmacologic therapy may be warranted, if clinically indicated. 5. Additional incidental findings, as above. Electronically Signed   By: Vinnie Langton M.D.   On: 12/01/2022 12:58   CT ANGIO CHEST AORTA W/CM & OR WO/CM  Result Date: 12/01/2022 CLINICAL DATA:  77 year old male with history of severe aortic stenosis. Preprocedural study prior to potential transcatheter aortic valve replacement (TAVR). EXAM: CT ANGIOGRAPHY CHEST, ABDOMEN AND PELVIS TECHNIQUE: Multidetector CT imaging through the chest, abdomen and pelvis was performed using the standard protocol during bolus administration of intravenous  contrast. Multiplanar reconstructed images and MIPs were obtained and reviewed to evaluate the vascular anatomy. RADIATION DOSE REDUCTION: This exam was performed according to the departmental dose-optimization program which includes automated exposure control, adjustment of the mA and/or kV according to patient size and/or use of iterative reconstruction technique. CONTRAST:  176m OMNIPAQUE IOHEXOL 350 MG/ML SOLN COMPARISON:  No priors. FINDINGS: CTA CHEST FINDINGS Cardiovascular: Heart size is normal, however, there appears to be concentric left ventricular hypertrophy. There is no significant pericardial fluid, thickening or pericardial calcification. There is aortic atherosclerosis, as well as atherosclerosis of the great vessels of the mediastinum and the coronary arteries, including calcified atherosclerotic plaque in the left main, left anterior descending, left circumflex and right coronary arteries. Severe thickening and calcification of the aortic valve. Mild mitral annular calcifications. Mediastinum/Lymph Nodes: Mildly enlarged right hilar lymph node (axial image 63 of series 7) measuring 1.5 cm in short axis. No other pathologically enlarged mediastinal or left hilar lymph nodes are noted. Esophagus is unremarkable in appearance. No axillary lymphadenopathy. Lungs/Pleura: Several aggressive appearing pulmonary nodules and masses are noted. The largest of these is in the apex of the right upper lobe (axial image 36 of series 8 and coronal image 78 of series 9) measuring 3.7 x 2.2 x 2.6 cm. Right lower lobe pulmonary nodule measuring 2.3 x 2.1 cm (axial image 86 of series 8) and left upper lobe pulmonary nodule (axial image 56 of series 8) measuring 1.2 x 1.2 cm also noted. Several smaller subcentimeter pulmonary nodules are also noted. The largest of these pulmonary nodules and masses all demonstrate macrolobulated and spiculated margins with slightly indistinct borders, overall very aggressive in  appearance. No acute consolidative airspace disease. Trace bilateral pleural effusions lying dependently. Musculoskeletal/Soft Tissues: There are no aggressive appearing lytic or blastic lesions noted in the visualized portions of the  skeleton. CTA ABDOMEN AND PELVIS FINDINGS Hepatobiliary: No suspicious cystic or solid hepatic lesions. No intra or extrahepatic biliary ductal dilatation. Gallbladder is unremarkable in appearance. Pancreas: No pancreatic mass. No pancreatic ductal dilatation. No pancreatic or peripancreatic fluid collections or inflammatory changes. Spleen: Unremarkable. Adrenals/Urinary Tract: Bilateral kidneys and bilateral adrenal glands are normal in appearance. No hydroureteronephrosis. Urinary bladder is unremarkable in appearance. Stomach/Bowel: The appearance of the stomach is unremarkable. There is no pathologic dilatation of small bowel or colon. Normal appendix. Vascular/Lymphatic: Aortic atherosclerosis, with vascular findings and measurements pertinent to potential TAVR procedure, as detailed below. No aneurysm or dissection noted in the abdominal or pelvic vasculature. No lymphadenopathy noted in the abdomen or pelvis. Reproductive: Severe median lobe hypertrophy of the prostate gland. Seminal vesicles are unremarkable in appearance. Other: No significant volume of ascites.  No pneumoperitoneum. Musculoskeletal: There are no aggressive appearing lytic or blastic lesions noted in the visualized portions of the skeleton. VASCULAR MEASUREMENTS PERTINENT TO TAVR: AORTA: Minimal Aortic Diameter-16 x 15 mm Severity of Aortic Calcification-moderate to severe RIGHT PELVIS: Right Common Iliac Artery - Minimal Diameter-8.7 x 7.3 mm Tortuosity-mild Calcification-moderate Right External Iliac Artery - Minimal Diameter-8.0 x 7.8 mm Tortuosity-mild Calcification-mild Right Common Femoral Artery - Minimal Diameter-8.2 x 7.5 mm Tortuosity-mild Calcification-mild LEFT PELVIS: Left Common Iliac Artery -  Minimal Diameter-9.7 x 9.4 mm Tortuosity-mild Calcification-moderate Left External Iliac Artery - Minimal Diameter-7.4 x 7.5 mm Tortuosity-mild Calcification-mild Left Common Femoral Artery - Minimal Diameter-6.8 x 8.6 mm Tortuosity-mild Calcification-mild Review of the MIP images confirms the above findings. IMPRESSION: 1. Vascular findings and measurements pertinent to potential TAVR procedure, as detailed above. 2. Severe thickening calcification of the aortic valve, compatible with reported clinical history of severe aortic stenosis. 3. Multiple bilateral pulmonary nodules and masses highly concerning for malignancy. Whether this represents multicentric primary bronchogenic malignancy or metastatic disease the chest is uncertain (metachronous bronchogenic malignancies are favored). Mildly enlarged right hilar lymph node suspicious for metastatic lymphadenopathy. While there are no definitive findings to suggest metastatic disease in the abdomen or pelvis on today's examination, further evaluation with PET-CT should be considered in the near future for staging purposes. 4. Aortic atherosclerosis, in addition to left main and three-vessel coronary artery disease. Assessment for potential risk factor modification, dietary therapy or pharmacologic therapy may be warranted, if clinically indicated. 5. Additional incidental findings, as above. Electronically Signed   By: Vinnie Langton M.D.   On: 12/01/2022 12:58   CARDIAC CATHETERIZATION  Result Date: 11/22/2022   Mid LAD lesion is 40% stenosed.   Ost Cx to Prox Cx lesion is 60% stenosed.   Prox Cx to Mid Cx lesion is 40% stenosed.   Ost RCA lesion is 50% stenosed.   Dist RCA lesion is 100% stenosed.   2nd Mrg lesion is 30% stenosed.   Ost LAD lesion is 70% stenosed.   Non-stenotic Prox LAD lesion was previously treated. 1.  RFR positive ostial left circumflex disease (0.87) and RFR negative ostial LAD disease (0.94) with known chronic total occlusion of mid  right coronary artery.  Given the lack of exertional angina no interventions were pursued. 2.  Mean right atrial pressure of 7 mmHg, right ventricular pressure of 32/1 with an RV end-diastolic pressure of 8 mmHg, mean wedge pressure of 17 mmHg, mean PA pressure of 23 mmHg, with Fick cardiac output of 5.96 L/min and Fick cardiac index of 2.85 L/min/m. Recommendation: Continue evaluation for aortic valve intervention.

## 2022-12-12 ENCOUNTER — Ambulatory Visit (HOSPITAL_COMMUNITY)
Admission: RE | Admit: 2022-12-12 | Discharge: 2022-12-12 | Disposition: A | Discharge status: Home / Self Care | Payer: Medicare Other | Source: Ambulatory Visit | Attending: Hematology | Admitting: Hematology

## 2022-12-12 ENCOUNTER — Encounter: Payer: Self-pay | Admitting: Hematology

## 2022-12-12 ENCOUNTER — Inpatient Hospital Stay: Payer: Medicare Other | Attending: Physician Assistant | Admitting: Hematology

## 2022-12-12 VITALS — BP 103/58 | HR 71 | Temp 98.0°F | Resp 18 | Ht 70.5 in | Wt 199.7 lb

## 2022-12-12 DIAGNOSIS — C3490 Malignant neoplasm of unspecified part of unspecified bronchus or lung: Secondary | ICD-10-CM

## 2022-12-12 DIAGNOSIS — Z87891 Personal history of nicotine dependence: Secondary | ICD-10-CM | POA: Insufficient documentation

## 2022-12-12 DIAGNOSIS — G9389 Other specified disorders of brain: Secondary | ICD-10-CM | POA: Diagnosis not present

## 2022-12-12 DIAGNOSIS — I2510 Atherosclerotic heart disease of native coronary artery without angina pectoris: Secondary | ICD-10-CM | POA: Diagnosis not present

## 2022-12-12 DIAGNOSIS — R918 Other nonspecific abnormal finding of lung field: Secondary | ICD-10-CM | POA: Diagnosis not present

## 2022-12-12 DIAGNOSIS — G319 Degenerative disease of nervous system, unspecified: Secondary | ICD-10-CM | POA: Diagnosis not present

## 2022-12-12 MED ORDER — GADOBUTROL 1 MMOL/ML IV SOLN
7.0000 mL | Freq: Once | INTRAVENOUS | Status: AC | PRN
  Administered 2022-12-12: 7 mL via INTRAVENOUS

## 2022-12-12 NOTE — Patient Instructions (Addendum)
[facility]  Discharge Instructions  You were seen and examined today by Dr. Delton Coombes. Dr. Delton Coombes is a medical oncologist, meaning that he specializes in the treatment of cancer diagnoses. Dr. Delton Coombes discussed your past medical history, family history of cancers, and the events that led to you being here today.  You were referred to Dr. Delton Coombes due to a questionable lung mass. Dr. Delton Coombes has reviewed your recent PET scan which revealed masses in both of your lungs highly concerning for cancer. There is no spread of cancer to your other organs or bones noted on the PET scan.  Dr. Delton Coombes has recommended a referral to a lung specialist (pulmonologist) so that we can get a biopsy. You will also need a brain MRI. The brain MRI is done for all patients with a lung mass to ensure there is no spread of cancer to the brain.  Follow-up as scheduled with Dr. Delton Coombes one week after the biopsy.  Thank you for choosing [facility] to provide your oncology and hematology care.   To afford each patient quality time with our provider, please arrive at least 15 minutes before your scheduled appointment time. You may need to reschedule your appointment if you arrive late (10 or more minutes). Arriving late affects you and other patients whose appointments are after yours.  Also, if you miss three or more appointments without notifying the office, you may be dismissed from the clinic at the provider's discretion.    Again, thank you for choosing [facility].  Our hope is that these requests will decrease the amount of time that you wait before being seen by our physicians.   If you have a lab appointment with the [facility] please come in thru the Main Entrance and check in at the main information desk.           _____________________________________________________________  Should you have questions after your visit to  [facility], please contact our office at [phone] and follow the prompts.  Our office hours are 8:00 a.m. to 4:30 p.m. Monday - Thursday and 8:00 a.m. to 2:30 p.m. Friday.  Please note that voicemails left after 4:00 p.m. may not be returned until the following business day.  We are closed weekends and all major holidays.  You do have access to a nurse 24-7, just call the main number to the clinic [phone] and do not press any options, hold on the line and a nurse will answer the phone.    For prescription refill requests, have your pharmacy contact our office and allow 72 hours.    Masks are optional in the cancer centers. If you would like for your care team to wear a mask while they are taking care of you, please let them know. You may have one support person who is at least 76 years old accompany you for your appointments.

## 2022-12-14 ENCOUNTER — Encounter (HOSPITAL_BASED_OUTPATIENT_CLINIC_OR_DEPARTMENT_OTHER): Payer: Self-pay | Admitting: Pulmonary Disease

## 2022-12-14 ENCOUNTER — Ambulatory Visit (INDEPENDENT_AMBULATORY_CARE_PROVIDER_SITE_OTHER): Payer: Medicare Other | Admitting: Pulmonary Disease

## 2022-12-14 VITALS — BP 106/60 | HR 59 | Ht 70.5 in | Wt 200.6 lb

## 2022-12-14 DIAGNOSIS — R918 Other nonspecific abnormal finding of lung field: Secondary | ICD-10-CM

## 2022-12-14 NOTE — Progress Notes (Deleted)
Referring Provider: Redmond School, MD Primary Care Physician:  Redmond School, MD Primary Gastroenterologist:  Dr. Gala Romney  No chief complaint on file.   HPI:   Jerry Roach is a 77 y.o. male presenting today at the request of Redmond School, MD for anal pain, consult colonoscopy.   Last colonoscopy in January 2013 with multiple colon polyps removed, largest being approximately 3 mm x 10 mm in the ascending segment.  Pathology with tubular adenomas.  Recommended repeat colonoscopy in 3 years.      Recently found to have PET positive bilateral lung masses concerning for lung cancer in February 2024 with plans undergo bronchoscopy in the near future for definitive diagnosis.  He also has history of severe aortic valve stenosis and is being evaluated for aortic valve replacement. ***  Past Medical History:  Diagnosis Date   CAD (coronary artery disease)    a. 12/2017: s/p NSTEMI with DES to Proximal LAD. CTO of RCA with L--> R collaterals noted.    Former smoker    HTN (hypertension)    Hypercholesteremia    Nocturia    Severe aortic stenosis    Thrombocytopenia (HCC)    Mild   Weak urinary stream     Past Surgical History:  Procedure Laterality Date   CATARACT EXTRACTION W/PHACO Left 03/03/2015   Procedure: CATARACT EXTRACTION PHACO AND INTRAOCULAR LENS PLACEMENT (IOC);  Surgeon: Baruch Goldmann, MD;  Location: [facility];  Service: Ophthalmology;  Laterality: Left;  CDE 6.86   CATARACT EXTRACTION W/PHACO Right 08/31/2015   Procedure: CATARACT EXTRACTION PHACO AND INTRAOCULAR LENS PLACEMENT RIGHT EYE CDE=4.08;  Surgeon: Baruch Goldmann, MD;  Location: [facility];  Service: Ophthalmology;  Laterality: Right;   COLONOSCOPY  11/05/2011   Procedure: COLONOSCOPY;  Surgeon: Daneil Dolin, MD;  Location: [facility];  Service: Endoscopy;  Laterality: N/A;  7:30 AM   CORONARY STENT INTERVENTION N/A 12/27/2017   Procedure: CORONARY STENT INTERVENTION;  Surgeon: Nelva Bush, MD;   Location: [facility];  Service: Cardiovascular;  Laterality: N/A;   INTRAVASCULAR PRESSURE WIRE/FFR STUDY N/A 11/22/2022   Procedure: INTRAVASCULAR PRESSURE WIRE/FFR STUDY;  Surgeon: Early Osmond, MD;  Location: [facility];  Service: Cardiovascular;  Laterality: N/A;   LEFT HEART CATH AND CORONARY ANGIOGRAPHY N/A 12/27/2017   Procedure: LEFT HEART CATH AND CORONARY ANGIOGRAPHY;  Surgeon: Nelva Bush, MD;  Location: [facility];  Service: Cardiovascular;  Laterality: N/A;   NO PAST SURGERIES     RIGHT HEART CATH AND CORONARY ANGIOGRAPHY N/A 11/22/2022   Procedure: RIGHT HEART CATH AND CORONARY ANGIOGRAPHY;  Surgeon: Early Osmond, MD;  Location: [facility];  Service: Cardiovascular;  Laterality: N/A;    Current Outpatient Medications  Medication Sig Dispense Refill   acetaminophen (TYLENOL) 325 MG tablet Take 2 tablets (650 mg total) by mouth every 6 (six) hours as needed for mild pain or headache.     alfuzosin (UROXATRAL) 10 MG 24 hr tablet Take 1 tablet (10 mg total) by mouth daily with breakfast. 90 tablet 3   aspirin EC 81 MG EC tablet Take 1 tablet (81 mg total) by mouth daily.     atorvastatin (LIPITOR) 80 MG tablet TAKE 1 TABLET(80 MG) BY MOUTH DAILY AT 6 PM 60 tablet 1   Carboxymethylcellulose Sodium (EYE DROPS OP) Place 1 drop into both eyes daily as needed (gummy eyes).     Flaxseed, Linseed, (FLAX SEED OIL PO) Take 1,200 mg by mouth daily.  hydrocortisone (ANUSOL-HC) 25 MG suppository Place 25 mg rectally 2 (two) times daily as needed (Help use the bathroom).     Multiple Vitamins-Minerals (MULTIVITAMIN WITH MINERALS) tablet Take 1 tablet by mouth daily. Centrum silver     nitroGLYCERIN (NITROSTAT) 0.4 MG SL tablet Place 1 tablet (0.4 mg total) under the tongue every 5 (five) minutes x 3 doses as needed for chest pain. 25 tablet 3   oxymetazoline (AFRIN) 0.05 % nasal spray Place 1 spray into both nostrils daily as needed for congestion.      No current facility-administered medications for this visit.    Allergies as of 12/17/2022   (No Known Allergies)    Family History  Problem Relation Age of Onset   Hypertension Father    COPD Mother    Hypertension Sister    COPD Sister    COPD Brother    Colon cancer Neg Hx     Social History   Socioeconomic History   Marital status: Married    Spouse name: Not on file   Number of children: Not on file   Years of education: Not on file   Highest education level: Not on file  Occupational History   Not on file  Tobacco Use   Smoking status: Former    Packs/day: 1.50    Years: 39.00    Total pack years: 58.50    Types: Cigarettes    Quit date: 12/26/2017    Years since quitting: 4.9   Smokeless tobacco: Never   Tobacco comments:    Started smoking at 44 quit at 62 total 33yr Vaping Use   Vaping Use: Never used  Substance and Sexual Activity   Alcohol use: No   Drug use: No   Sexual activity: Yes    Birth control/protection: None  Other Topics Concern   Not on file  Social History Narrative   Not on file   Social Determinants of Health   Financial Resource Strain: Low Risk  (01/26/2021)   Overall Financial Resource Strain (CARDIA)    Difficulty of Paying Living Expenses: Not hard at all  Food Insecurity: No Food Insecurity (12/12/2022)   Hunger Vital Sign    Worried About Running Out of Food in the Last Year: Never true    Ran Out of Food in the Last Year: Never true  Transportation Needs: No Transportation Needs (12/12/2022)   PRAPARE - THydrologist(Medical): No    Lack of Transportation (Non-Medical): No  Physical Activity: Sufficiently Active (01/26/2021)   Exercise Vital Sign    Days of Exercise per Week: 5 days    Minutes of Exercise per Session: 50 min  Stress: No Stress Concern Present (01/26/2021)   [place]   Feeling of Stress : Not at all   Social Connections: [place](01/26/2021)   Social Connection and Isolation Panel [NHANES]    Frequency of Communication with Friends and Family: More than three times a week    Frequency of Social Gatherings with Friends and Family: More than three times a week    Attends Religious Services: 1 to 4 times per year    Active Member of [employer] Organizations: No    Attends CArchivistMeetings: 1 to 4 times per year    Marital Status: Married  IHuman resources officerViolence: Not At Risk (12/12/2022)   Humiliation, Afraid, Rape, and Kick questionnaire    Fear of  Current or Ex-Partner: No    Emotionally Abused: No    Physically Abused: No    Sexually Abused: No    Review of Systems: Gen: Denies any fever, chills, fatigue, weight loss, lack of appetite.  CV: Denies chest pain, heart palpitations, peripheral edema, syncope.  Resp: Denies shortness of breath at rest or with exertion. Denies wheezing or cough.  GI: Denies dysphagia or odynophagia. Denies jaundice, hematemesis, fecal incontinence. GU : Denies urinary burning, urinary frequency, urinary hesitancy MS: Denies joint pain, muscle weakness, cramps, or limitation of movement.  Derm: Denies rash, itching, dry skin Psych: Denies depression, anxiety, memory loss, and confusion Heme: Denies bruising, bleeding, and enlarged lymph nodes.  Physical Exam: There were no vitals taken for this visit. General:   Alert and oriented. Pleasant and cooperative. Well-nourished and well-developed.  Head:  Normocephalic and atraumatic. Eyes:  Without icterus, sclera clear and conjunctiva pink.  Ears:  Normal auditory acuity. Lungs:  Clear to auscultation bilaterally. No wheezes, rales, or rhonchi. No distress.  Heart:  S1, S2 present without murmurs appreciated.  Abdomen:  +BS, soft, non-tender and non-distended. No HSM noted. No guarding or rebound. No masses appreciated.  Rectal:  Deferred  Msk:  Symmetrical without gross  deformities. Normal posture. Extremities:  Without edema. Neurologic:  Alert and  oriented x4;  grossly normal neurologically. Skin:  Intact without significant lesions or rashes. Psych:  Alert and cooperative. Normal mood and affect.    Assessment:     Plan:  ***   Aliene Altes, PA-C [facility] 12/17/2022

## 2022-12-14 NOTE — H&P (View-Only) (Signed)
  Subjective:   PATIENT ID: Jerry Roach GENDER: male DOB: 04/20/1946, MRN: [id number]  No chief complaint on file.   Reason for Visit: New consult for   [name] is 77 year old male former smoker with CAD s/p DES to LA, moderate aortic stenosis, HTN, HLD who presents for evaluation for pulmonary nodules.  He was referred to Pulmonary by Hematology for evaluation for pulmonary nodules initially seen on CT Coronary 11/30/33 with follow-up scan demonstrating in multiple bilateral pulmonary masses. Follow-up PET with hypermetabolic nodules  Denies personal or family history of cancer. Has shortness of breath, chest tightness with exertion associated with left arm pain that he attributes to his aortic stenosis and prior cardiac issues. He previously smoked 1.5 ppd x 50 years. Quit 5 years ago. 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 Denies weight loss. Decreased appetite. Denies cough, hemoptysis.   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   I have personally reviewed patient's past medical/family/social history, allergies, current medications.  Past Medical History:  Diagnosis Date   CAD (coronary artery disease)    a. 12/2017: s/p NSTEMI with DES to Proximal LAD. CTO of RCA with L--> R collaterals noted.    Former smoker    HTN (hypertension)    Hypercholesteremia    Nocturia    Severe aortic stenosis    Thrombocytopenia (HCC)    Mild   Weak urinary stream      Family History  Problem Relation Age of Onset   Hypertension Father    COPD Mother    Hypertension Sister    COPD Sister    COPD Brother    Colon cancer Neg Hx      Social History   Occupational History   Not on file  Tobacco Use   Smoking status: Former    Packs/day: 1.50    Years: 39.00    Total pack years: 58.50    Types: Cigarettes    Quit date: 12/26/2017    Years since quitting: 4.9   Smokeless tobacco: Never  Vaping Use   Vaping Use: Never used   Substance and Sexual Activity   Alcohol use: No   Drug use: No   Sexual activity: Yes    Birth control/protection: None    No Known Allergies   Outpatient Medications Prior to Visit  Medication Sig Dispense Refill   acetaminophen (TYLENOL) 325 MG tablet Take 2 tablets (650 mg total) by mouth every 6 (six) hours as needed for mild pain or headache.     alfuzosin (UROXATRAL) 10 MG 24 hr tablet Take 1 tablet (10 mg total) by mouth daily with breakfast. 90 tablet 3   aspirin EC 81 MG EC tablet Take 1 tablet (81 mg total) by mouth daily.     atorvastatin (LIPITOR) 80 MG tablet TAKE 1 TABLET(80 MG) BY MOUTH DAILY AT 6 PM 60 tablet 1   Carboxymethylcellulose Sodium (EYE DROPS OP) Place 1 drop into both eyes daily as needed (gummy eyes).     Flaxseed, Linseed, (FLAX SEED OIL PO) Take 1,200 mg by mouth daily.     hydrocortisone (ANUSOL-HC) 25 MG suppository Place 25 mg rectally 2 (two) times daily as needed (Help use the bathroom).     Multiple Vitamins-Minerals (MULTIVITAMIN WITH MINERALS) tablet Take 1 tablet by mouth daily. Centrum silver     nitroGLYCERIN (NITROSTAT) 0.4 MG SL tablet Place 1 tablet (0.4 mg total) under the tongue every 5 (five) minutes x 3 doses as   needed for chest pain. 25 tablet 3   oxymetazoline (AFRIN) 0.05 % nasal spray Place 1 spray into both nostrils daily as needed for congestion.     No facility-administered medications prior to visit.    ROS   Objective:  There were no vitals filed for this visit.    Physical Exam: General: Well-appearing, no acute distress HENT: [place], AT Eyes: EOMI, no scleral icterus Respiratory: ***Clear to auscultation bilaterally.  No crackles, wheezing or rales Cardiovascular: RRR, -M/R/G, no JVD Extremities:-Edema,-tenderness Neuro: AAO x4, CNII-XII grossly intact Psych: Normal mood, normal affect  Data Reviewed:  Imaging: PET/CT 12/06/22 - multiple hypermetabolic bilateral pulmonary nodules and masses including RUL with  largest measuring 3.7 x 2 cm and RLL 2.3 x 2.1 cm and LUL 1.5 x 1.0 cm  PFT: None on file  Labs:    Latest Ref Rng & Units 11/22/2022    9:53 AM 11/22/2022    9:47 AM 11/22/2022    9:44 AM  CBC  Hemoglobin 13.0 - 17.0 g/dL 11.6  12.2  12.2   Hematocrit 39.0 - 52.0 % 34.0  36.0  36.0       Latest Ref Rng & Units 11/22/2022    9:53 AM 11/22/2022    9:47 AM 11/22/2022    9:44 AM  BMP  Sodium 135 - 145 mmol/L 141  143  142   Potassium 3.5 - 5.1 mmol/L 3.7  3.8  4.0       Assessment & Plan:   Discussion: HPI  ***  Health Maintenance  There is no immunization history on file for this patient. CT Lung Screen -   No orders of the defined types were placed in this encounter. No orders of the defined types were placed in this encounter.   No follow-ups on file.  I have spent a total time of***-minutes on the day of the appointment reviewing prior documentation, coordinating care and discussing medical diagnosis and plan with the patient/family. Imaging, labs and tests included in this note have been reviewed and interpreted independently by me.  Khaniya Tenaglia Jane Kindred Reidinger, MD [facility] 12/14/2022 8:19 AM  Office Number [phone]   

## 2022-12-14 NOTE — Patient Instructions (Signed)
Multiple lung masses --Plan for navigational bronchoscopy --We will call you to schedule procedure   Please call our office if you have not been scheduled by Tuesday

## 2022-12-14 NOTE — Progress Notes (Unsigned)
Subjective:   PATIENT ID: Jerry Roach GENDER: male DOB: 1946-04-12, MRN: [id number]  No chief complaint on file.   Reason for Visit: New consult for   [name] is 77 year old male former smoker with CAD s/p DES to LA, moderate aortic stenosis, HTN, HLD who presents for evaluation for pulmonary nodules.  He was referred to Pulmonary by Hematology for evaluation for pulmonary nodules initially seen on CT Coronary 11/30/33 with follow-up scan demonstrating in multiple bilateral pulmonary masses. Follow-up PET with hypermetabolic nodules  Denies personal or family history of cancer. Has shortness of breath, chest tightness with exertion associated with left arm pain that he attributes to his aortic stenosis and prior cardiac issues. He previously smoked 1.5 ppd x 50 years. Quit 5 years ago. 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 Denies weight loss. Decreased appetite. Denies cough, hemoptysis.   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   I have personally reviewed patient's past medical/family/social history, allergies, current medications.  Past Medical History:  Diagnosis Date   CAD (coronary artery disease)    a. 12/2017: s/p NSTEMI with DES to Proximal LAD. CTO of RCA with L--> R collaterals noted.    Former smoker    HTN (hypertension)    Hypercholesteremia    Nocturia    Severe aortic stenosis    Thrombocytopenia (HCC)    Mild   Weak urinary stream      Family History  Problem Relation Age of Onset   Hypertension Father    COPD Mother    Hypertension Sister    COPD Sister    COPD Brother    Colon cancer Neg Hx      Social History   Occupational History   Not on file  Tobacco Use   Smoking status: Former    Packs/day: 1.50    Years: 39.00    Total pack years: 58.50    Types: Cigarettes    Quit date: 12/26/2017    Years since quitting: 4.9   Smokeless tobacco: Never  Vaping Use   Vaping Use: Never used   Substance and Sexual Activity   Alcohol use: No   Drug use: No   Sexual activity: Yes    Birth control/protection: None    No Known Allergies   Outpatient Medications Prior to Visit  Medication Sig Dispense Refill   acetaminophen (TYLENOL) 325 MG tablet Take 2 tablets (650 mg total) by mouth every 6 (six) hours as needed for mild pain or headache.     alfuzosin (UROXATRAL) 10 MG 24 hr tablet Take 1 tablet (10 mg total) by mouth daily with breakfast. 90 tablet 3   aspirin EC 81 MG EC tablet Take 1 tablet (81 mg total) by mouth daily.     atorvastatin (LIPITOR) 80 MG tablet TAKE 1 TABLET(80 MG) BY MOUTH DAILY AT 6 PM 60 tablet 1   Carboxymethylcellulose Sodium (EYE DROPS OP) Place 1 drop into both eyes daily as needed (gummy eyes).     Flaxseed, Linseed, (FLAX SEED OIL PO) Take 1,200 mg by mouth daily.     hydrocortisone (ANUSOL-HC) 25 MG suppository Place 25 mg rectally 2 (two) times daily as needed (Help use the bathroom).     Multiple Vitamins-Minerals (MULTIVITAMIN WITH MINERALS) tablet Take 1 tablet by mouth daily. Centrum silver     nitroGLYCERIN (NITROSTAT) 0.4 MG SL tablet Place 1 tablet (0.4 mg total) under the tongue every 5 (five) minutes x 3 doses as  needed for chest pain. 25 tablet 3   oxymetazoline (AFRIN) 0.05 % nasal spray Place 1 spray into both nostrils daily as needed for congestion.     No facility-administered medications prior to visit.    ROS   Objective:  There were no vitals filed for this visit.    Physical Exam: General: Well-appearing, no acute distress HENT: [place], AT Eyes: EOMI, no scleral icterus Respiratory: ***Clear to auscultation bilaterally.  No crackles, wheezing or rales Cardiovascular: RRR, -M/R/G, no JVD Extremities:-Edema,-tenderness Neuro: AAO x4, CNII-XII grossly intact Psych: Normal mood, normal affect  Data Reviewed:  Imaging: PET/CT 12/06/22 - multiple hypermetabolic bilateral pulmonary nodules and masses including RUL with  largest measuring 3.7 x 2 cm and RLL 2.3 x 2.1 cm and LUL 1.5 x 1.0 cm  PFT: None on file  Labs:    Latest Ref Rng & Units 11/22/2022    9:53 AM 11/22/2022    9:47 AM 11/22/2022    9:44 AM  CBC  Hemoglobin 13.0 - 17.0 g/dL 04.5  40.9  81.1   Hematocrit 39.0 - 52.0 % 34.0  36.0  36.0       Latest Ref Rng & Units 11/22/2022    9:53 AM 11/22/2022    9:47 AM 11/22/2022    9:44 AM  BMP  Sodium 135 - 145 mmol/L 141  143  142   Potassium 3.5 - 5.1 mmol/L 3.7  3.8  4.0       Assessment & Plan:   Discussion: HPI  ***  Health Maintenance  There is no immunization history on file for this patient. CT Lung Screen -   No orders of the defined types were placed in this encounter. No orders of the defined types were placed in this encounter.   No follow-ups on file.  I have spent a total time of***-minutes on the day of the appointment reviewing prior documentation, coordinating care and discussing medical diagnosis and plan with the patient/family. Imaging, labs and tests included in this note have been reviewed and interpreted independently by me.  Overton Boggus Mechele Collin, MD [facility] 12/14/2022 8:19 AM  Office Number [phone]

## 2022-12-16 ENCOUNTER — Telehealth (HOSPITAL_BASED_OUTPATIENT_CLINIC_OR_DEPARTMENT_OTHER): Payer: Self-pay | Admitting: Pulmonary Disease

## 2022-12-16 ENCOUNTER — Encounter (INDEPENDENT_AMBULATORY_CARE_PROVIDER_SITE_OTHER): Payer: Self-pay

## 2022-12-16 ENCOUNTER — Encounter (HOSPITAL_BASED_OUTPATIENT_CLINIC_OR_DEPARTMENT_OTHER): Payer: Self-pay | Admitting: Pulmonary Disease

## 2022-12-16 NOTE — Telephone Encounter (Signed)
Orders placed for STAT super D CT and navigational bronchoscopy request. Please schedule patient for 12/24/22 with Dr. Lamonte Sakai OR 12/27/22 with Dr. Verlee Monte, pending availability.

## 2022-12-17 ENCOUNTER — Encounter: Payer: Self-pay | Admitting: Pulmonary Disease

## 2022-12-17 ENCOUNTER — Ambulatory Visit: Payer: Medicare Other | Admitting: Gastroenterology

## 2022-12-17 NOTE — Telephone Encounter (Signed)
I can do on 12/24/22

## 2022-12-18 ENCOUNTER — Telehealth: Payer: Self-pay

## 2022-12-18 NOTE — Telephone Encounter (Signed)
I spoke with the pt's wife and scheduled the pt to see Dr Ali Lowe on 3/15 at 8:00 AM for further evaluation. She will make the patient aware of appointment.

## 2022-12-18 NOTE — Telephone Encounter (Signed)
The pt called into the office with complaints of chest discomfort and bilateral arm tingling that occurred this morning with exertion. The pt took 2 SL NTG with relief in symptoms.  The pt had a similar episode on Sunday morning. The pt has also noticed that his heart rate is faster than normal when checking his oxygen level on pulse oximeter.  Per pt his pulse is running in the 90s and normally it is around 60-70.  Pt is currently undergoing pulmonary/oncology evaluation due to lung masses seen on TAVR CT scan.  The pt is scheduled for a bronchoscopy/biopsy on 3/18. The pt would like to see Dr Ali Lowe for evaluation.  I will discuss with MD and call the patient back with an appointment.

## 2022-12-20 ENCOUNTER — Ambulatory Visit (HOSPITAL_COMMUNITY)
Admission: RE | Admit: 2022-12-20 | Discharge: 2022-12-20 | Disposition: A | Discharge status: Home / Self Care | Payer: Medicare Other | Source: Ambulatory Visit | Attending: Pulmonary Disease | Admitting: Pulmonary Disease

## 2022-12-20 DIAGNOSIS — R918 Other nonspecific abnormal finding of lung field: Secondary | ICD-10-CM | POA: Insufficient documentation

## 2022-12-20 DIAGNOSIS — J90 Pleural effusion, not elsewhere classified: Secondary | ICD-10-CM | POA: Diagnosis not present

## 2022-12-20 NOTE — Progress Notes (Signed)
Patient ID: Jerry Roach MRN: [id number] DOB/AGE: 1946/05/24 77 y.o.  Primary Care Physician:Fusco, Purcell Nails, MD Primary Cardiologist: Carlyle Dolly, MD   FOCUSED CARDIOVASCULAR PROBLEM LIST:   1.  Severe aortic stenosis with an aortic valve area of 0.41 cm, mean gradient 44 mmHg, and a peak velocity of 4 m/s with ejection fraction of 60 to 65%; no conduction abnormalities 2.  Coronary artery disease status post PCI of proximal LAD 2019 with known chronic total occlusion of distal right coronary 3.  Hypertension 4.  Hyperlipidemia 5.  Prior tobacco abuse 6.  Mild thrombocytopenia followed by hematology; on chronic aspirin monotherapy   HISTORY OF PRESENT ILLNESS:  July 2023 consultation: The patient is a 77 y.o. male with the indicated medical history here for recommendations regarding his severe symptomatic aortic stenosis.  He was recently seen by PA Strader and then endorsed some increasing dyspnea on exertion.  The patient is here with his wife.  He continues to farm.  He tells me he may be a little bit more short of breath but otherwise can work a 12-hour day without any issues.  He denies any exertional angina.  Has had no presyncope or syncope.  He denies any paroxysmal nocturnal dyspnea, orthopnea, or symptomatic palpitations.  He feels very well and is able to do everything he needs to do in a day.  Compared to last year he is may be a tad bit slower but this does not really bother him too much.  He sees a Pharmacist, community on a regular basis and needs to have a tooth extracted he tells me.  He does not smoke.  Plan:  Follow up in 3 months with echocardiogram and monitor for symptoms.  October 2023: The patient did not appear for his appointment earlier this month as he was busy on his farm.  He is very active on his farm.  He continues to work for 13 to 14 hours without any limitations.  He tells me sometimes when he eats a big breakfast he will get somewhat short of breath in the  morning but this lasts only an hour or so.  He denies any shortness of breath, significant chest tightness, presyncope or syncope.  He is required no emergency room visits or hospitalizations.  He is otherwise well and without significant cardiovascular complaints today.  Plan:  Monitor for now.  January 2024: In the interim the patient had an echocardiogram which demonstrated severe aortic stenosis with a preserved ejection fraction.  He tells me he is able to do everything he needs to do in a day.  He feels like sometimes he is slowing down but thinks this is due to old age.  He denies any significant chest pain or dyspnea.  He denies any presyncope or syncope.  He has not required emergency room visits or hospitalizations.  He tells me that his wife is having a lot of issues with arthritis and he is taking care of her a lot.  He did see a dentist 6 months ago and did have a tooth extracted.  Plan: Exercise treadmill stress test for exercise tolerance.  Today: In the interim he had a coronary angiography study which confirmed an occluded distal right coronary artery, RFR negative ostial LAD disease, and moderate ostial ramus disease.  He had a TAVR protocol CTA which was concerning for malignancy.  Specifically had a right upper lobe lung nodule, right lower lobe lung nodule and a left upper lobe lung nodule.  A  PET scan showed FDG avidity in all these nodules.  An MRI of the brain demonstrated no metastatic disease.  The patient tells me that he developed exertional bilateral arm pain and chest discomfort.  He has been taking as needed nitroglycerin rather frequently.  He tells me that he develops the symptoms when he exerts himself.  He does not have the symptoms at rest.  He typically takes nitroglycerin and the symptoms resolved.  He is not really had an episode where he did not take nitroglycerin and just rested.  He denies any presyncope or syncope.  He denies any palpitations.  Past Medical History:   Diagnosis Date   CAD (coronary artery disease)    a. 12/2017: s/p NSTEMI with DES to Proximal LAD. CTO of RCA with L--> R collaterals noted.    Former smoker    HTN (hypertension)    Hypercholesteremia    Nocturia    Severe aortic stenosis    Thrombocytopenia (HCC)    Mild   Weak urinary stream     Past Surgical History:  Procedure Laterality Date   CATARACT EXTRACTION W/PHACO Left 03/03/2015   Procedure: CATARACT EXTRACTION PHACO AND INTRAOCULAR LENS PLACEMENT (IOC);  Surgeon: Baruch Goldmann, MD;  Location: [facility];  Service: Ophthalmology;  Laterality: Left;  CDE 6.86   CATARACT EXTRACTION W/PHACO Right 08/31/2015   Procedure: CATARACT EXTRACTION PHACO AND INTRAOCULAR LENS PLACEMENT RIGHT EYE CDE=4.08;  Surgeon: Baruch Goldmann, MD;  Location: [facility];  Service: Ophthalmology;  Laterality: Right;   COLONOSCOPY  11/05/2011   Procedure: COLONOSCOPY;  Surgeon: Daneil Dolin, MD;  Location: [facility];  Service: Endoscopy;  Laterality: N/A;  7:30 AM   CORONARY STENT INTERVENTION N/A 12/27/2017   Procedure: CORONARY STENT INTERVENTION;  Surgeon: Nelva Bush, MD;  Location: [facility];  Service: Cardiovascular;  Laterality: N/A;   INTRAVASCULAR PRESSURE WIRE/FFR STUDY N/A 11/22/2022   Procedure: INTRAVASCULAR PRESSURE WIRE/FFR STUDY;  Surgeon: Early Osmond, MD;  Location: [facility];  Service: Cardiovascular;  Laterality: N/A;   LEFT HEART CATH AND CORONARY ANGIOGRAPHY N/A 12/27/2017   Procedure: LEFT HEART CATH AND CORONARY ANGIOGRAPHY;  Surgeon: Nelva Bush, MD;  Location: [facility];  Service: Cardiovascular;  Laterality: N/A;   NO PAST SURGERIES     RIGHT HEART CATH AND CORONARY ANGIOGRAPHY N/A 11/22/2022   Procedure: RIGHT HEART CATH AND CORONARY ANGIOGRAPHY;  Surgeon: Early Osmond, MD;  Location: [facility];  Service: Cardiovascular;  Laterality: N/A;    Family History  Problem Relation Age of Onset   Hypertension Father    COPD Mother     Hypertension Sister    COPD Sister    COPD Brother    Colon cancer Neg Hx     Social History   Socioeconomic History   Marital status: Married    Spouse name: Not on file   Number of children: Not on file   Years of education: Not on file   Highest education level: Not on file  Occupational History   Not on file  Tobacco Use   Smoking status: Former    Packs/day: 1.50    Years: 39.00    Additional pack years: 0.00    Total pack years: 58.50    Types: Cigarettes    Quit date: 12/26/2017    Years since quitting: 4.9   Smokeless tobacco: Never   Tobacco comments:    Started smoking at 16 quit at 41 total 80yr  Vaping Use   Vaping Use: Never used  Substance and Sexual Activity   Alcohol use: No   Drug use: No   Sexual activity: Yes    Birth control/protection: None  Other Topics Concern   Not on file  Social History Narrative   Not on file   Social Determinants of Health   Financial Resource Strain: Low Risk  (01/26/2021)   Overall Financial Resource Strain (CARDIA)    Difficulty of Paying Living Expenses: Not hard at all  Food Insecurity: No Food Insecurity (12/12/2022)   Hunger Vital Sign    Worried About Running Out of Food in the Last Year: Never true    Ran Out of Food in the Last Year: Never true  Transportation Needs: No Transportation Needs (12/12/2022)   PRAPARE - Hydrologist (Medical): No    Lack of Transportation (Non-Medical): No  Physical Activity: Sufficiently Active (01/26/2021)   Exercise Vital Sign    Days of Exercise per Week: 5 days    Minutes of Exercise per Session: 50 min  Stress: No Stress Concern Present (01/26/2021)   [place]    Feeling of Stress : Not at all  Social Connections: [place] (01/26/2021)   Social Connection and Isolation Panel [NHANES]    Frequency of Communication with Friends and Family: More than three times a week     Frequency of Social Gatherings with Friends and Family: More than three times a week    Attends Religious Services: 1 to 4 times per year    Active Member of [employer] or Organizations: No    Attends Archivist Meetings: 1 to 4 times per year    Marital Status: Married  Human resources officer Violence: Not At Risk (12/12/2022)   Humiliation, Afraid, Rape, and Kick questionnaire    Fear of Current or Ex-Partner: No    Emotionally Abused: No    Physically Abused: No    Sexually Abused: No     Prior to Admission medications   Medication Sig Start Date End Date Taking? Authorizing Provider  acetaminophen (TYLENOL) 325 MG tablet Take 2 tablets (650 mg total) by mouth every 6 (six) hours as needed for mild pain or headache. 12/29/17   Erlene Quan, PA-C  alfuzosin (UROXATRAL) 10 MG 24 hr tablet Take 1 tablet (10 mg total) by mouth daily with breakfast. 12/27/21   Cleon Gustin, MD  aspirin EC 81 MG EC tablet Take 1 tablet (81 mg total) by mouth daily. 12/30/17   Erlene Quan, PA-C  atorvastatin (LIPITOR) 80 MG tablet TAKE 1 TABLET(80 MG) BY MOUTH DAILY AT 6 PM 01/19/22   Arnoldo Lenis, MD  mometasone (NASONEX) 50 MCG/ACT nasal spray 2 sprays daily. 12/02/19   [provider]  nitroGLYCERIN (NITROSTAT) 0.4 MG SL tablet Place 1 tablet (0.4 mg total) under the tongue every 5 (five) minutes x 3 doses as needed for chest pain. 12/29/17   Kroeger, Lorelee Cover., PA-C    No Known Allergies  REVIEW OF SYSTEMS:  General: no fevers/chills/night sweats Eyes: no blurry vision, diplopia, or amaurosis ENT: no sore throat or hearing loss Resp: no cough, wheezing, or hemoptysis CV: no edema or palpitations GI: no abdominal pain, nausea, vomiting, diarrhea, or constipation GU: no dysuria, frequency, or hematuria Skin: no rash Neuro: no headache, numbness, tingling, or weakness of extremities Musculoskeletal: no joint pain or swelling Heme: no bleeding, DVT, or easy bruising  Endo: no  polydipsia or polyuria  BP 100/60   Pulse 73   Ht 5' 10.5" (1.791 m)   Wt 202 lb (91.6 kg)   SpO2 99%   BMI 28.57 kg/m   PHYSICAL EXAM: GEN:  AO x 3 in no acute distress HEENT: normal Dentition:  Some dental erosion is present Neck: JVP normal. + 1 carotid upstrokes without bruits. No thyromegaly. Lungs: equal expansion, clear bilaterally CV: Apex is discrete and nondisplaced, RRR with 2/6 SEM Abd: soft, non-tender, non-distended; no bruit; positive bowel sounds Ext: no edema, ecchymoses, or cyanosis Vascular: 2+ femoral pulses, 2+ radial pulses       Skin: warm and dry without rash Neuro: CN II-XII grossly intact; motor and sensory grossly intact    DATA AND STUDIES:  EKG: June 2023 Sinus rhythm with occasional PVCs; EKG done today that I personally reviewed demonstrates sinus rhythm with first-degree AV block and nonspecific ST and T wave changes.  2D ECHO:  January 2024: 1. Left ventricular ejection fraction, by estimation, is 65 to 70%. The  left ventricle has normal function. The left ventricle has no regional  wall motion abnormalities. There is moderate left ventricular hypertrophy.  Left ventricular diastolic  parameters are indeterminate. Elevated left atrial pressure.   2. Right ventricular systolic function is normal. The right ventricular  size is normal.   3. Left atrial size was mildly dilated.   4. The mitral valve is normal in structure. No evidence of mitral valve  regurgitation. No evidence of mitral stenosis. Severe mitral annular  calcification.   5. The aortic valve has an indeterminant number of cusps. There is  moderate calcification of the aortic valve. There is moderate thickening  of the aortic valve. Aortic valve regurgitation is mild to moderate.  Severe aortic valve stenosis. Aortic valve  mean gradient measures 44.5 mmHg. Aortic valve peak gradient measures 71.9  mmHg. Aortic valve area, by VTI measures 0.58 cm.   6. The inferior vena  cava is dilated in size with >50% respiratory  variability, suggesting right atrial pressure of 8 mmHg.   June 2023:   1. Left ventricular ejection fraction, by estimation, is 60 to 65%. The  left ventricle has normal function. The left ventricle has no regional  wall motion abnormalities. There is moderate concentric left ventricular  hypertrophy. Left ventricular  diastolic parameters are consistent with Grade I diastolic dysfunction  (impaired relaxation).   2. Right ventricular systolic function is normal. The right ventricular  size is normal. Tricuspid regurgitation signal is inadequate for assessing  PA pressure.   3. Left atrial size was mildly dilated.   4. The mitral valve is degenerative. No evidence of mitral valve  regurgitation. Mild mitral stenosis. Severe mitral annular calcification.   5. The aortic valve is calcified. Aortic valve regurgitation is mild.  Severe aortic valve stenosis.   6. The inferior vena cava is dilated in size with <50% respiratory  variability, suggesting right atrial pressure of 15 mmHg.  CARDIAC CATH: 2024 1.  RFR positive ostial left circumflex disease (0.87) and RFR negative ostial LAD disease (0.94) with known chronic total occlusion of mid right coronary artery.  Given the lack of exertional angina no interventions were pursued. 2.  Mean right atrial pressure of 7 mmHg, right ventricular pressure of 32/1 with an RV end-diastolic pressure of 8 mmHg, mean wedge pressure of 17 mmHg, mean PA pressure of 23 mmHg, with Fick cardiac output of 5.96 L/min and Fick cardiac index of  2.85 L/min/m.  STS RISK CALCULATOR:   Procedure Type: Isolated AVR PERIOPERATIVE OUTCOME ESTIMATE % Operative Mortality 1.58% Morbidity & Mortality 9.31% Stroke 1.23% Renal Failure 1.34% Reoperation 4.39% Prolonged Ventilation 3.01% Deep Sternal Wound Infection 0.041% [facility] Stay (>14 days) 3.81% Short Hospital Stay (<6 days)* 51.1%    NHYA CLASS:  1    ASSESSMENT AND PLAN:   Chest pain of uncertain etiology - Plan: EKG 12-Lead  Severe aortic stenosis  CAD -S/P PCI 12/27/17  Hyperlipidemia LDL goal <70  Primary hypertension  Lung mass  Unfortunately the patient has been found to have worrisome lung lesions concerning for malignancy.  He is to undergo bronchoscopy with biopsy on Monday.  He additionally has severe aortic stenosis as well as moderate coronary artery disease.  He has stable anginal symptoms currently.  I reviewed his coronary angiogram that was done last month which shows high-grade ostial ramus disease with moderate disease elsewhere and had occluded distal right coronary artery as well collateralized.  I think for now the best thing is medical management until we have more clarity regarding the patient's lung nodules and his overall prognosis.  It is quite possible that his chest discomfort is from his valve as well.  For now I will start Imdur 30 mg at bedtime as well as Toprol-XL 12.5 mg at bedtime.  I will see him back in 1 month.  Have asked him to let us know how he is doing in regards to his chest pain symptoms.  This is a difficult situation and I did tell the patient if his oncologic prognosis is unfavorable with less than 1 year of life expectancy then aortic valve intervention would not be pursued.  If needed we may pursue PCI of the ostial left circumflex for symptom relief.  I will reach out to his pulmonary and oncology providers to discuss further.   Early Osmond, MD  12/21/2022 8:45 AM    [address], [place], [place]  [address] Phone: [phone]; Fax: [phone]

## 2022-12-20 NOTE — Progress Notes (Addendum)
PCP - Dr. Redmond School Cardiologist - Dr. Ali Lowe Hematology: Dr. Awilda Bill  PPM/ICD - Denies  Chest x-ray - 08/15/2020 EKG - 11/23/2022 Stress Test - 11/08/2022 ECHO - 10/29/2022 Cardiac Cath - 11/22/2022  CPAP - Denies  Non-diabetic  Blood Thinner Instructions: Denies Aspirin Instructions: Per protocol stop ASA 3 days prior to surgery. Last dose 12/20/2022  ERAS Protcol - No, NPO  COVID TEST- Had one 12/21/2022 at [facility]  Anesthesia review: Yes, Patient has appointment with Dr. Ali Lowe 12/21/2022 for multiple episodes of chest pain and having to take nitro, also elevated HR.  Hx aortic stenosis, HTN, CAD, Lung mass, Thrombocytopenia  Patient verbally denies any shortness of breath, fever, cough and chest pain during phone call   -------------  SDW INSTRUCTIONS given:  Your procedure is scheduled on Monday, December 24, 2022.  Report to [facility] Main Entrance "A" at 1015 A.M., and check in at the Admitting office.  Call this number if you have problems the morning of surgery:  [phone]   Remember:  Do not eat or drink after midnight the night before your surgery    Take these medicines the morning of surgery with A SIP OF WATER  Alfuzosin  As needed: Tylenol, carboxymethycellulose eye drops, Afrin   As of today, STOP taking any Aspirin (unless otherwise instructed by your surgeon) Aleve, Naproxen, Ibuprofen, Motrin, Advil, Goody's, BC's, all herbal medications, fish oil, and all vitamins.                      Do not wear lotions, powders, colognes, or deodorant.  Men may shave face and neck.            Do not bring valuables to the hospital.             [facility] is not responsible for any belongings or valuables.  Do NOT Smoke (Tobacco/Vaping) 24 hours prior to your procedure If you use a CPAP at night, you may bring all equipment for your overnight stay.   Contacts, glasses, dentures or bridgework may not be worn into surgery.      For patients  admitted to the hospital, discharge time will be determined by your treatment team.   Patients discharged the day of surgery will not be allowed to drive home, and someone needs to stay with them for 24 hours.    Special instructions:   [facility]- Preparing For Surgery  Before surgery, you can play an important role. Because skin is not sterile, your skin needs to be as free of germs as possible. You can reduce the number of germs on your skin by washing with Dial Soap before surgery.     Oral Hygiene is also important to reduce your risk of infection.  Remember - BRUSH YOUR TEETH THE MORNING OF SURGERY WITH YOUR REGULAR TOOTHPASTE   Please follow these instructions carefully.   Shower the NIGHT BEFORE SURGERY and the MORNING OF SURGERY with DIAL Soap.   Pat yourself dry with a CLEAN TOWEL.  Wear CLEAN PAJAMAS to bed the night before surgery  Place CLEAN SHEETS on your bed the night of your first shower and DO NOT SLEEP WITH PETS.   Day of Surgery: Please shower morning of surgery  Wear Clean/Comfortable clothing the morning of surgery Do not apply any deodorants/lotions.   Remember to brush your teeth WITH YOUR REGULAR TOOTHPASTE.   Questions were answered. Patient verbalized understanding of instructions.

## 2022-12-21 ENCOUNTER — Other Ambulatory Visit: Payer: Self-pay | Admitting: Cardiology

## 2022-12-21 ENCOUNTER — Ambulatory Visit: Payer: Medicare Other | Attending: Internal Medicine | Admitting: Internal Medicine

## 2022-12-21 ENCOUNTER — Other Ambulatory Visit: Payer: Self-pay | Admitting: *Deleted

## 2022-12-21 ENCOUNTER — Encounter (HOSPITAL_COMMUNITY): Payer: Self-pay | Admitting: Emergency Medicine

## 2022-12-21 ENCOUNTER — Other Ambulatory Visit: Payer: Self-pay

## 2022-12-21 ENCOUNTER — Encounter: Payer: Self-pay | Admitting: Internal Medicine

## 2022-12-21 ENCOUNTER — Other Ambulatory Visit: Payer: Medicare Other

## 2022-12-21 VITALS — BP 100/60 | HR 73 | Ht 70.5 in | Wt 202.0 lb

## 2022-12-21 DIAGNOSIS — R079 Chest pain, unspecified: Secondary | ICD-10-CM | POA: Diagnosis not present

## 2022-12-21 DIAGNOSIS — Z9861 Coronary angioplasty status: Secondary | ICD-10-CM | POA: Insufficient documentation

## 2022-12-21 DIAGNOSIS — R918 Other nonspecific abnormal finding of lung field: Secondary | ICD-10-CM

## 2022-12-21 DIAGNOSIS — I10 Essential (primary) hypertension: Secondary | ICD-10-CM | POA: Diagnosis not present

## 2022-12-21 DIAGNOSIS — I350 Nonrheumatic aortic (valve) stenosis: Secondary | ICD-10-CM | POA: Diagnosis not present

## 2022-12-21 DIAGNOSIS — Z01812 Encounter for preprocedural laboratory examination: Secondary | ICD-10-CM

## 2022-12-21 DIAGNOSIS — E785 Hyperlipidemia, unspecified: Secondary | ICD-10-CM | POA: Insufficient documentation

## 2022-12-21 DIAGNOSIS — I2510 Atherosclerotic heart disease of native coronary artery without angina pectoris: Secondary | ICD-10-CM

## 2022-12-21 MED ORDER — ISOSORBIDE MONONITRATE ER 30 MG PO TB24: 30.0000 mg | ORAL_TABLET | Freq: Every day | ORAL | 3 refills | Status: DC

## 2022-12-21 MED ORDER — NITROGLYCERIN 0.4 MG SL SUBL: 0.4000 mg | SUBLINGUAL_TABLET | SUBLINGUAL | 3 refills | Status: DC | PRN

## 2022-12-21 MED ORDER — METOPROLOL SUCCINATE ER 25 MG PO TB24: 12.5000 mg | ORAL_TABLET | Freq: Every day | ORAL | 3 refills | Status: DC

## 2022-12-21 NOTE — Patient Instructions (Signed)
Medication Instructions:  Your physician has recommended you make the following change in your medication:   Start Imdur 30 mg daily at bedtime Start Toprol XL 12.5 mg daily at bedtime   *If you need a refill on your cardiac medications before your next appointment, please call your pharmacy*   Follow-Up: At [facility], you and your health needs are our priority.  As part of our continuing mission to provide you with exceptional heart care, we have created designated Provider Care Teams.  These Care Teams include your primary Cardiologist (physician) and Advanced Practice Providers (APPs -  Physician Assistants and Nurse Practitioners) who all work together to provide you with the care you need, when you need it.  Your next appointment:   One month.   Provider:   Early Osmond, MD

## 2022-12-23 LAB — NOVEL CORONAVIRUS, NAA: SARS-CoV-2, NAA: NOT DETECTED

## 2022-12-24 ENCOUNTER — Encounter (HOSPITAL_COMMUNITY)
Admission: RE | Disposition: A | Discharge status: Home / Self Care | Payer: Self-pay | Source: Ambulatory Visit | Attending: Emergency Medicine

## 2022-12-24 ENCOUNTER — Ambulatory Visit (HOSPITAL_COMMUNITY): Payer: Medicare Other | Admitting: Physician Assistant

## 2022-12-24 ENCOUNTER — Encounter (HOSPITAL_COMMUNITY): Payer: Self-pay | Admitting: Emergency Medicine

## 2022-12-24 ENCOUNTER — Ambulatory Visit (HOSPITAL_COMMUNITY): Payer: Medicare Other

## 2022-12-24 ENCOUNTER — Ambulatory Visit (HOSPITAL_BASED_OUTPATIENT_CLINIC_OR_DEPARTMENT_OTHER): Payer: Medicare Other | Admitting: Physician Assistant

## 2022-12-24 ENCOUNTER — Ambulatory Visit (HOSPITAL_COMMUNITY)
Admission: RE | Admit: 2022-12-24 | Discharge: 2022-12-24 | Disposition: A | Discharge status: Home / Self Care | Payer: Medicare Other | Source: Ambulatory Visit | Attending: Emergency Medicine | Admitting: Emergency Medicine

## 2022-12-24 DIAGNOSIS — I252 Old myocardial infarction: Secondary | ICD-10-CM

## 2022-12-24 DIAGNOSIS — Z955 Presence of coronary angioplasty implant and graft: Secondary | ICD-10-CM | POA: Insufficient documentation

## 2022-12-24 DIAGNOSIS — I10 Essential (primary) hypertension: Secondary | ICD-10-CM | POA: Diagnosis not present

## 2022-12-24 DIAGNOSIS — C3431 Malignant neoplasm of lower lobe, right bronchus or lung: Secondary | ICD-10-CM

## 2022-12-24 DIAGNOSIS — I119 Hypertensive heart disease without heart failure: Secondary | ICD-10-CM

## 2022-12-24 DIAGNOSIS — J90 Pleural effusion, not elsewhere classified: Secondary | ICD-10-CM | POA: Diagnosis not present

## 2022-12-24 DIAGNOSIS — I25119 Atherosclerotic heart disease of native coronary artery with unspecified angina pectoris: Secondary | ICD-10-CM

## 2022-12-24 DIAGNOSIS — C3411 Malignant neoplasm of upper lobe, right bronchus or lung: Secondary | ICD-10-CM | POA: Diagnosis not present

## 2022-12-24 DIAGNOSIS — I2510 Atherosclerotic heart disease of native coronary artery without angina pectoris: Secondary | ICD-10-CM | POA: Insufficient documentation

## 2022-12-24 DIAGNOSIS — R918 Other nonspecific abnormal finding of lung field: Secondary | ICD-10-CM

## 2022-12-24 DIAGNOSIS — E785 Hyperlipidemia, unspecified: Secondary | ICD-10-CM | POA: Diagnosis not present

## 2022-12-24 DIAGNOSIS — I350 Nonrheumatic aortic (valve) stenosis: Secondary | ICD-10-CM | POA: Diagnosis not present

## 2022-12-24 DIAGNOSIS — Z87891 Personal history of nicotine dependence: Secondary | ICD-10-CM | POA: Insufficient documentation

## 2022-12-24 DIAGNOSIS — I352 Nonrheumatic aortic (valve) stenosis with insufficiency: Secondary | ICD-10-CM | POA: Diagnosis not present

## 2022-12-24 HISTORY — DX: Malignant (primary) neoplasm, unspecified: C80.1

## 2022-12-24 HISTORY — PX: BRONCHIAL BRUSHINGS: SHX5108

## 2022-12-24 HISTORY — PX: BRONCHIAL NEEDLE ASPIRATION BIOPSY: SHX5106

## 2022-12-24 HISTORY — PX: FIDUCIAL MARKER PLACEMENT: SHX6858

## 2022-12-24 HISTORY — PX: BRONCHIAL BIOPSY: SHX5109

## 2022-12-24 HISTORY — PX: BRONCHIAL WASHINGS: SHX5105

## 2022-12-24 HISTORY — DX: Angina pectoris, unspecified: I20.9

## 2022-12-24 HISTORY — PX: VIDEO BRONCHOSCOPY WITH RADIAL ENDOBRONCHIAL ULTRASOUND: SHX6849

## 2022-12-24 HISTORY — DX: Dyspnea, unspecified: R06.00

## 2022-12-24 HISTORY — DX: Unspecified osteoarthritis, unspecified site: M19.90

## 2022-12-24 HISTORY — DX: Acute myocardial infarction, unspecified: I21.9

## 2022-12-24 LAB — CBC
HCT: 34.6 % — ABNORMAL LOW (ref 39.0–52.0)
Hemoglobin: 11.7 g/dL — ABNORMAL LOW (ref 13.0–17.0)
MCH: 34.4 pg — ABNORMAL HIGH (ref 26.0–34.0)
MCHC: 33.8 g/dL (ref 30.0–36.0)
MCV: 101.8 fL — ABNORMAL HIGH (ref 80.0–100.0)
Platelets: 125 10*3/uL — ABNORMAL LOW (ref 150–400)
RBC: 3.4 MIL/uL — ABNORMAL LOW (ref 4.22–5.81)
RDW: 13.4 % (ref 11.5–15.5)
WBC: 8.8 10*3/uL (ref 4.0–10.5)
nRBC: 0 % (ref 0.0–0.2)

## 2022-12-24 LAB — BASIC METABOLIC PANEL
Anion gap: 7 (ref 5–15)
BUN: 12 mg/dL (ref 8–23)
CO2: 24 mmol/L (ref 22–32)
Calcium: 8.8 mg/dL — ABNORMAL LOW (ref 8.9–10.3)
Chloride: 106 mmol/L (ref 98–111)
Creatinine, Ser: 1.06 mg/dL (ref 0.61–1.24)
GFR, Estimated: >60 mL/min (ref >60)
Glucose, Bld: 111 mg/dL — ABNORMAL HIGH (ref 70–99)
Potassium: 3.8 mmol/L (ref 3.5–5.1)
Sodium: 137 mmol/L (ref 135–145)

## 2022-12-24 SURGERY — BRONCHOSCOPY, WITH BIOPSY USING ELECTROMAGNETIC NAVIGATION
Anesthesia: General

## 2022-12-24 MED ORDER — PROPOFOL 500 MG/50ML IV EMUL
INTRAVENOUS | Status: DC | PRN
  Administered 2022-12-24: 75 ug/kg/min via INTRAVENOUS

## 2022-12-24 MED ORDER — ETOMIDATE 2 MG/ML IV SOLN
INTRAVENOUS | Status: DC | PRN
  Administered 2022-12-24: 16 mg via INTRAVENOUS

## 2022-12-24 MED ORDER — FENTANYL CITRATE (PF) 100 MCG/2ML IJ SOLN: 25.0000 ug | INTRAMUSCULAR | Status: DC | PRN

## 2022-12-24 MED ORDER — SUGAMMADEX SODIUM 200 MG/2ML IV SOLN
INTRAVENOUS | Status: DC | PRN
  Administered 2022-12-24: 200 mg via INTRAVENOUS

## 2022-12-24 MED ORDER — LACTATED RINGERS IV SOLN: INTRAVENOUS | Status: DC

## 2022-12-24 MED ORDER — PHENYLEPHRINE 80 MCG/ML (10ML) SYRINGE FOR IV PUSH (FOR BLOOD PRESSURE SUPPORT)
PREFILLED_SYRINGE | INTRAVENOUS | Status: DC | PRN
  Administered 2022-12-24: 80 ug via INTRAVENOUS

## 2022-12-24 MED ORDER — FENTANYL CITRATE (PF) 250 MCG/5ML IJ SOLN
INTRAMUSCULAR | Status: DC | PRN
  Administered 2022-12-24: 50 ug via INTRAVENOUS
  Administered 2022-12-24: 25 ug via INTRAVENOUS

## 2022-12-24 MED ORDER — NITROGLYCERIN 0.4 MG SL SUBL: 0.4000 mg | SUBLINGUAL_TABLET | SUBLINGUAL | 3 refills | Status: DC | PRN

## 2022-12-24 MED ORDER — ROCURONIUM BROMIDE 10 MG/ML (PF) SYRINGE
PREFILLED_SYRINGE | INTRAVENOUS | Status: DC | PRN
  Administered 2022-12-24: 10 mg via INTRAVENOUS
  Administered 2022-12-24: 50 mg via INTRAVENOUS
  Administered 2022-12-24: 20 mg via INTRAVENOUS

## 2022-12-24 MED ORDER — PHENYLEPHRINE HCL-NACL 20-0.9 MG/250ML-% IV SOLN
INTRAVENOUS | Status: DC | PRN
  Administered 2022-12-24: 25 ug/min via INTRAVENOUS

## 2022-12-24 MED ORDER — CHLORHEXIDINE GLUCONATE 0.12 % MT SOLN
15.0000 mL | OROMUCOSAL | Status: AC
  Administered 2022-12-24: 15 mL via OROMUCOSAL
  Filled 2022-12-24: qty 15

## 2022-12-24 MED ORDER — ONDANSETRON HCL 4 MG/2ML IJ SOLN: 4.0000 mg | Freq: Once | INTRAMUSCULAR | Status: DC | PRN

## 2022-12-24 MED ORDER — DEXAMETHASONE SODIUM PHOSPHATE 10 MG/ML IJ SOLN
INTRAMUSCULAR | Status: DC | PRN
  Administered 2022-12-24: 5 mg via INTRAVENOUS

## 2022-12-24 MED ORDER — OXYCODONE HCL 5 MG/5ML PO SOLN: 5.0000 mg | Freq: Once | ORAL | Status: DC | PRN

## 2022-12-24 MED ORDER — LIDOCAINE 2% (20 MG/ML) 5 ML SYRINGE
INTRAMUSCULAR | Status: DC | PRN
  Administered 2022-12-24: 60 mg via INTRAVENOUS

## 2022-12-24 MED ORDER — OXYCODONE HCL 5 MG PO TABS: 5.0000 mg | ORAL_TABLET | Freq: Once | ORAL | Status: DC | PRN

## 2022-12-24 SURGICAL SUPPLY — 1 items: fiducial IMPLANT

## 2022-12-24 NOTE — Discharge Instructions (Signed)
Flexible Bronchoscopy, Care After This sheet gives you information about how to care for yourself after your test. Your doctor may also give you more specific instructions. If you have problems or questions, contact your doctor. Follow these instructions at home: Eating and drinking When your numbness is gone and your cough and gag reflexes have come back, you may: Eat only soft foods. Slowly drink liquids. The day after the test, go back to your normal diet. Driving Do not drive for 24 hours if you were given a medicine to help you relax (sedative). Do not drive or use heavy machinery while taking prescription pain medicine. General instructions  Take over-the-counter and prescription medicines only as told by your doctor. Return to your normal activities as told. Ask what activities are safe for you. Do not use any products that have nicotine or tobacco in them. This includes cigarettes and e-cigarettes. If you need help quitting, ask your doctor. Keep all follow-up visits as told by your doctor. This is important. It is very important if you had a tissue sample (biopsy) taken. Get help right away if: You have shortness of breath that gets worse. You get light-headed. You feel like you are going to pass out (faint). You have chest pain. You cough up: More than a little blood. More blood than before. Summary Do not eat or drink anything (not even water) for 2 hours after your test, or until your numbing medicine wears off. Do not use cigarettes. Do not use e-cigarettes. Get help right away if you have chest pain.  Please call our office for any questions or concerns.  [phone].  This information is not intended to replace advice given to you by your health care provider. Make sure you discuss any questions you have with your health care provider. Document Released: 07/22/2009 Document Revised: 09/06/2017 Document Reviewed: 10/12/2016 Elsevier Patient Education  2020 Elsevier  Inc.  

## 2022-12-24 NOTE — Anesthesia Procedure Notes (Signed)
Arterial Line Insertion Start/End3/18/2024 8:20 AM, 12/24/2022 8:30 AM Performed by: Kyung Rudd, CRNA, CRNA  Patient location: Pre-op. Preanesthetic checklist: patient identified, IV checked, site marked, risks and benefits discussed, surgical consent, monitors and equipment checked, pre-op evaluation and timeout performed Lidocaine 1% used for infiltration Right was placed Catheter size: 20 G Hand hygiene performed , maximum sterile barriers used  and Seldinger technique used Allen's test indicative of satisfactory collateral circulation Attempts: 1 Procedure performed without using ultrasound guided technique. Following insertion, Biopatch and dressing applied. Post procedure assessment: normal  Patient tolerated the procedure well with no immediate complications.

## 2022-12-24 NOTE — Op Note (Signed)
Video Bronchoscopy with Robotic Assisted Bronchoscopic Navigation   Date of Operation: 12/24/2022   Pre-op Diagnosis: Bilateral pulmonary nodules  Post-op Diagnosis: Bilateral pulmonary nodules  Surgeon: Baltazar Apo  Assistants: None  Anesthesia: General endotracheal anesthesia  Operation: Flexible video fiberoptic bronchoscopy with robotic assistance and biopsies.  Estimated Blood Loss: Minimal  Complications: None  Indications and History: Jerry Roach is a 77 y.o. male with history of tobacco use, CAD, severe aortic stenosis, hypertension.  Found to have bilateral pulmonary nodules concerning for possible bronchogenic malignancy.  Recommendation made to achieve a tissue diagnosis via robotic assisted navigational bronchoscopy. The risks, benefits, complications, treatment options and expected outcomes were discussed with the patient.  The possibilities of pneumothorax, pneumonia, reaction to medication, pulmonary aspiration, perforation of a viscus, bleeding, failure to diagnose a condition and creating a complication requiring transfusion or operation were discussed with the patient who freely signed the consent.    Description of Procedure: The patient was seen in the Preoperative Area, was examined and was deemed appropriate to proceed.  The patient was taken to [facility] endoscopy room 3, identified as Jerry Roach and the procedure verified as Flexible Video Fiberoptic Bronchoscopy.  A Time Out was held and the above information confirmed.   Prior to the date of the procedure a high-resolution CT scan of the chest was performed. Utilizing ION software program a virtual tracheobronchial tree was generated to allow the creation of distinct navigation pathways to the patient's parenchymal abnormalities. After being taken to the operating room general anesthesia was initiated and the patient  was orally intubated. The video fiberoptic bronchoscope was introduced via the endotracheal tube  and a general inspection was performed which showed normal right and left lung anatomy. Aspiration of the bilateral mainstems was completed to remove any remaining secretions. Robotic catheter inserted into patient's endotracheal tube.   Target #1 right upper lobe pulmonary nodule: The distinct navigation pathways prepared prior to this procedure were then utilized to navigate to patient's lesion identified on CT scan. The robotic catheter was secured into place and the vision probe was withdrawn.  Lesion location was approximated using fluoroscopy and radial endobronchial ultrasound for peripheral targeting.  Needle-in-lesion was established using CIOs three-dimensional imaging.  Under fluoroscopic guidance transbronchial needle brushings, transbronchial needle biopsies, and transbronchial forceps biopsies were performed to be sent for cytology and pathology.  Under fluoroscopic guidance a single fiducial marker was placed adjacent to the lesion.  Target #2 right lower lobe pulmonary nodule: The distinct navigation pathways prepared prior to this procedure were then utilized to navigate to patient's lesion identified on CT scan. The robotic catheter was secured into place and the vision probe was withdrawn.  Lesion location was approximated using fluoroscopy and radial endobronchial ultrasound for peripheral targeting. Under fluoroscopic guidance transbronchial needle brushings, transbronchial needle biopsies, and transbronchial forceps biopsies were performed to be sent for cytology and pathology. A bronchioalveolar lavage was performed in the right lower lobe and sent for cytology and microbiology.  Target #3 left upper lobe pulmonary nodule: The distinct navigation pathways prepared prior to this procedure were then utilized to navigate to patient's lesion identified on CT scan. The robotic catheter was secured into place and the vision probe was withdrawn.  Lesion location was approximated using  fluoroscopy and radial endobronchial ultrasound for peripheral targeting.  Local registration and targeting was performed using Cios three-dimensional imaging.  Under fluoroscopic guidance transbronchial needle brushings, transbronchial needle biopsies, and transbronchial forceps biopsies were performed to be sent  for cytology and pathology.  Under fluoroscopic guidance a single fiducial marker was placed adjacent to the lesion.  At the end of the procedure a general airway inspection was performed and there was no evidence of active bleeding. The bronchoscope was removed.  The patient tolerated the procedure well. There was no significant blood loss and there were no obvious complications. A post-procedural chest x-ray is pending.  Samples Target #1: 1. Transbronchial needle brushings from right upper lobe pulmonary nodule 2. Transbronchial Wang needle biopsies from right upper lobe pulmonary nodule 3. Transbronchial forceps biopsies from right upper lobe pulmonary nodule  Samples Target #2: 1. Transbronchial needle brushings from right lower lobe pulmonary nodule 2. Transbronchial Wang needle biopsies from right lower lobe pulmonary nodule 3. Transbronchial forceps biopsies from right lower lobe pulmonary nodule 4. Bronchoalveolar lavage from right lower lobe  Samples Target #3: 1. Transbronchial needle brushings from left upper lobe pulmonary nodule 2. Transbronchial Wang needle biopsies from left upper lobe pulmonary nodule 3. Transbronchial forceps biopsies from left upper lobe pulmonary nodule  Plans:  The patient will be discharged from the PACU to home when recovered from anesthesia and after chest x-ray is reviewed. We will review the cytology, pathology and microbiology results with the patient when they become available. Outpatient followup will be with Dr. Loanne Drilling or Dr. Lamonte Sakai.    Baltazar Apo, MD, PhD 12/24/2022, 10:46 AM [facility] [phone] or if  no answer before 7:00PM call [phone] For any issues after 7:00PM please call eLink [phone]

## 2022-12-24 NOTE — Anesthesia Procedure Notes (Signed)
Procedure Name: Intubation Date/Time: 12/24/2022 9:16 AM  Performed by: Kyung Rudd, CRNAPre-anesthesia Checklist: Patient identified, Emergency Drugs available, Suction available and Patient being monitored Patient Re-evaluated:Patient Re-evaluated prior to induction Oxygen Delivery Method: Circle system utilized Preoxygenation: Pre-oxygenation with 100% oxygen Induction Type: IV induction Ventilation: Mask ventilation without difficulty Laryngoscope Size: Glidescope and 4 Tube type: Oral Tube size: 8.5 mm Number of attempts: 1 Airway Equipment and Method: Stylet Placement Confirmation: ETT inserted through vocal cords under direct vision, positive ETCO2 and breath sounds checked- equal and bilateral Secured at: 22 cm Tube secured with: Tape Dental Injury: Teeth and Oropharynx as per pre-operative assessment  Comments: Grade I view on Glidescope Go screen.

## 2022-12-24 NOTE — Anesthesia Preprocedure Evaluation (Addendum)
Anesthesia Evaluation  Patient identified by MRN, date of birth, ID band Patient awake    Reviewed: Allergy & Precautions, NPO status , Patient's Chart, lab work & pertinent test results  Airway Mallampati: II  TM Distance: >3 FB Neck ROM: Limited    Dental  (+) Poor Dentition, Dental Advisory Given, Caps, Chipped   Pulmonary shortness of breath and with exertion, former smoker Right pulmonary nodules/masses suspicious for malignancy ( hypermetabolic on PET Scan)   Pulmonary exam normal breath sounds clear to auscultation       Cardiovascular hypertension, Pt. on medications + angina with exertion and at rest + CAD, + Past MI and + Cardiac Stents  + Valvular Problems/Murmurs AS  Rhythm:Regular + Systolic murmurs Patient had to take NTG SL this am at 0430 for chest and bilateral upper arm pain and pressure- EKG this am noted  Echo 10/29/22  1. Left ventricular ejection fraction, by estimation, is 65 to 70%. The  left ventricle has normal function. The left ventricle has no regional  wall motion abnormalities. There is moderate left ventricular hypertrophy.  Left ventricular diastolic  parameters are indeterminate. Elevated left atrial pressure.   2. Right ventricular systolic function is normal. The right ventricular  size is normal.   3. Left atrial size was mildly dilated.   4. The mitral valve is normal in structure. No evidence of mitral valve  regurgitation. No evidence of mitral stenosis. Severe mitral annular  calcification.   5. The aortic valve has an indeterminant number of cusps. There is  moderate calcification of the aortic valve. There is moderate thickening  of the aortic valve. Aortic valve regurgitation is mild to moderate.  Severe aortic valve stenosis. Aortic valve  mean gradient measures 44.5 mmHg. Aortic valve peak gradient measures 71.9  mmHg. Aortic valve area, by VTI measures 0.58 cm.   6. The inferior  vena cava is dilated in size with >50% respiratory  variability, suggesting right atrial pressure of 8 mmHg.   Cardiac Cath 11/22/22   Mid LAD lesion is 40% stenosed.   Ost Cx to Prox Cx lesion is 60% stenosed.   Prox Cx to Mid Cx lesion is 40% stenosed.   Ost RCA lesion is 50% stenosed.   Dist RCA lesion is 100% stenosed.   2nd Mrg lesion is 30% stenosed.   Ost LAD lesion is 70% stenosed.   Non-stenotic Prox LAD lesion was previously treated.   1.  RFR positive ostial left circumflex disease (0.87) and RFR negative ostial LAD disease (0.94) with known chronic total occlusion of mid right coronary artery.  Given the lack of exertional angina no interventions were pursued. 2.  Mean right atrial pressure of 7 mmHg, right ventricular pressure of 32/1 with an RV end-diastolic pressure of 8 mmHg, mean wedge pressure of 17 mmHg, mean PA pressure of 23 mmHg, with Fick cardiac output of 5.96 L/min and Fick cardiac index of 2.85 L/min/m.    EKG 12/24/22 NSR, 1st deg AV Block, Non specific IVD, LAFB, ST depression in lateral leads    Neuro/Psych negative neurological ROS  negative psych ROS   GI/Hepatic negative GI ROS, Neg liver ROS,,,  Endo/Other  negative endocrine ROS    Renal/GU negative Renal ROS  negative genitourinary   Musculoskeletal  (+) Arthritis , Osteoarthritis,    Abdominal   Peds  Hematology negative hematology ROS (+)   Anesthesia Other Findings   Reproductive/Obstetrics  Anesthesia Physical Anesthesia Plan  ASA: 4  Anesthesia Plan: General   Post-op Pain Management: Minimal or no pain anticipated   Induction: Intravenous  PONV Risk Score and Plan: Treatment may vary due to age or medical condition and Ondansetron  Airway Management Planned: Oral ETT  Additional Equipment: Arterial line  Intra-op Plan:   Post-operative Plan: Extubation in OR  Informed Consent: I have reviewed the  patients History and Physical, chart, labs and discussed the procedure including the risks, benefits and alternatives for the proposed anesthesia with the patient or authorized representative who has indicated his/her understanding and acceptance.     Dental advisory given  Plan Discussed with: Anesthesiologist and CRNA  Anesthesia Plan Comments:         Anesthesia Quick Evaluation

## 2022-12-24 NOTE — Interval H&P Note (Signed)
History and Physical Interval Note:  12/24/2022 8:59 AM  Jerry Roach  has presented today for surgery, with the diagnosis of bilateral lung mass.  The various methods of treatment have been discussed with the patient and family. After consideration of risks, benefits and other options for treatment, the patient has consented to  Procedure(s): ROBOTIC ASSISTED NAVIGATIONAL BRONCHOSCOPY (N/A) as a surgical intervention.  The patient's history has been reviewed, patient examined, no change in status, stable for surgery.  I have reviewed the patient's chart and labs.  Questions were answered to the patient's satisfaction.     Collene Gobble

## 2022-12-24 NOTE — Transfer of Care (Signed)
Immediate Anesthesia Transfer of Care Note  Patient: Jerry Roach  Procedure(s) Performed: ROBOTIC ASSISTED NAVIGATIONAL BRONCHOSCOPY BRONCHIAL BRUSHINGS BRONCHIAL NEEDLE ASPIRATION BIOPSIES VIDEO BRONCHOSCOPY WITH RADIAL ENDOBRONCHIAL ULTRASOUND BRONCHIAL BIOPSIES BRONCHIAL WASHINGS FIDUCIAL MARKER PLACEMENT  Patient Location: PACU  Anesthesia Type:General  Level of Consciousness: awake, alert , and oriented  Airway & Oxygen Therapy: Patient Spontanous Breathing and Patient connected to face mask oxygen  Post-op Assessment: Report given to RN, Post -op Vital signs reviewed and stable, and Patient moving all extremities  Post vital signs: Reviewed and stable  Last Vitals:  Vitals Value Taken Time  BP 92/63 12/24/22 1055  Temp    Pulse 68 12/24/22 1058  Resp 22 12/24/22 1058  SpO2 99 % 12/24/22 1058  Vitals shown include unvalidated device data.  Last Pain:  Vitals:   12/24/22 0744  TempSrc:   PainSc: 0-No pain      Patients Stated Pain Goal: 0 ([id number])  Complications: No notable events documented.

## 2022-12-24 NOTE — Anesthesia Postprocedure Evaluation (Signed)
Anesthesia Post Note  Patient: Jerry Roach  Procedure(s) Performed: ROBOTIC ASSISTED NAVIGATIONAL BRONCHOSCOPY BRONCHIAL BRUSHINGS BRONCHIAL NEEDLE ASPIRATION BIOPSIES VIDEO BRONCHOSCOPY WITH RADIAL ENDOBRONCHIAL ULTRASOUND BRONCHIAL BIOPSIES BRONCHIAL WASHINGS FIDUCIAL MARKER PLACEMENT     Patient location during evaluation: PACU Anesthesia Type: General Level of consciousness: awake and alert and oriented Pain management: pain level controlled Vital Signs Assessment: post-procedure vital signs reviewed and stable Respiratory status: spontaneous breathing, nonlabored ventilation and respiratory function stable Cardiovascular status: blood pressure returned to baseline and stable Postop Assessment: no apparent nausea or vomiting Anesthetic complications: no   No notable events documented.  Last Vitals:  Vitals:   12/24/22 1115 12/24/22 1130  BP: 94/65 91/61  Pulse: 73 74  Resp: 18 17  Temp:    SpO2: 94% 95%    Last Pain:  Vitals:   12/24/22 1130  TempSrc:   PainSc: 0-No pain                 Lauraann Missey A.

## 2022-12-24 NOTE — Research (Signed)
Title: A multi-center, prospective, single-arm, observational study to evaluate real-world outcomes for the shape-sensing Ion endoluminal system  Primary Outcome: Evaluate procedure characteristics and short and long-term patient outcomes following shape-sensing robotic-assisted bronchoscopy (ssRAB) utilizing the Ion Endoluminal System for lung lesion localization or biopsy.   Protocol # / Study Name: ISI-ION-003 Clinical Trials #: [id number] Sponsor: [place] Investigator: Dr. Leory Plowman Icard  Key Features of Ion Endoluminal System (referred to as "Ion") Ion is the first FDA cleared bronchoscopy system that uses fiber optic shape sensing technology to inform on location within the airways. Its catheter/tool channel has a smaller outer diameter (3.5 mm) in comparison to conventional bronchoscopes, allowing it to navigate into the smaller airways of the periphery.     Key Inclusion Criteria Subject is 18 years or older at the time of the procedure Subject is a candidate for a planned, elective RAB lung lesion localization or biopsy procedure in which the Ion Endoluminal System is planned to be utilized.  Subject  able to understand and adhere to study requirements and provide informed consent.   Key Exclusion Criteria Subject is under the care of a Herbalist and is unable to provide informed consent on their own accord.  Subject is participating in an interventional research study or research study with investigational agents with an unknown safety profile that would interfere with participation or the results of this study.  Male subjects who are pregnant or nursing at the time of the index Ion procedure, as determined by standard site practices. Subjects that are incarcerated or institutionalized under court order, or other vulnerable populations.    Previous Clinical Trials Since receiving FDA clearance in Feb 2019, Ion has been adopted  commercially by over 226 centers in the [place], and utilized in over 40,000 procedures.  The first in-human study enrolled 13 subjects with a mean lesion size of 14.8 mm and the overall diagnostic yield was 79.3%, with no adverse events. 17 (58.6%) lesions were reported to have a bronchus sign available on CT imaging.  A multi-center study published results in 2022, with 270 lesions biopsied in 241 patients using Ion. The mean largest cardinal lesion size was 18.86.20mm, and the mean airway generation count was 7.01.6. Asymptomatic pneumothorax occurred in 3.3% of subjects, and 0.8% experienced airway bleeding.   Another study provided preliminary results in 2022, with 87% sensitivity for malignancy, a diagnostic yield of 81%, and a mean lesion size of 16 mm. 75% of biopsy cases were bronchus-sign negative. 4% of subjects experienced pneumothoraces (including those requiring intervention), and 0.8% of subjects experienced airway bleeding requiring wedging or balloon tamponade.  A single-center study captured 131 consecutive procedures of pulmonary biopsy using Ion. The navigational success rate was 98.7%, with an overall diagnostic yield of 81.7%, an overall complication rate of 3%, and a pneumothorax rate of 1.5%.    [facility] [place] Coordinator note:   This visit for Subject Jerry Roach with DOB: October 13, 1945 on 12/24/2022 for the above protocol is Visit/Encounter # Pre-Procedure, Intra-procedure and Post-procedure  and is for purpose of research.   The consent for this encounter is under:  Protocol Version 1.0 Investigator Brochure Version N/A Consent Version Revision A, dated [id number] and is currently IRB approved.   Jerry Roach expressed continued interest and consent in continuing as a study subject. Subject confirmed that there was no change in contact information (e.g. address, telephone, email). Subject thanked for participation in research and contribution to  science.  In this visit 12/24/2022 the subject will be evaluated by Sub-Investigator) named Dr. Lamonte Sakai. This research coordinator has verified that the above investigator is up to date with his/her training logs.   The Subject was informed that the PI continues to have oversight of the subject's visits and course through relevant discussions, reviews, and also specifically of this visit by routing of this note to the PI.  The research study was discussed with the subject in the pre-operative room. The study was explained in detail including all the contents of the informed consent document. The subject was encouraged to ask questions. All questions were answered to their satisfaction. The IRB approved informed consent was signed, and a copy was given to the subject. After obtaining consent, the subject underwent scheduled procedure using the ion endoluminal system. Data collection was completed per protocol. Refer to paper source subject binder for further details.     Signed by  Jerry Roach Clinical Research Coordinator / Sub-Investigator  [facility]  [place], [place] 10:40 AM 12/24/2022

## 2022-12-25 ENCOUNTER — Emergency Department (HOSPITAL_COMMUNITY): Payer: Medicare Other

## 2022-12-25 ENCOUNTER — Encounter (HOSPITAL_COMMUNITY): Payer: Self-pay | Admitting: Emergency Medicine

## 2022-12-25 ENCOUNTER — Other Ambulatory Visit: Payer: Self-pay

## 2022-12-25 ENCOUNTER — Emergency Department (HOSPITAL_COMMUNITY)
Admission: EM | Admit: 2022-12-25 | Discharge: 2023-01-07 | Disposition: E | Payer: Medicare Other | Attending: Emergency Medicine | Admitting: Emergency Medicine

## 2022-12-25 DIAGNOSIS — R079 Chest pain, unspecified: Secondary | ICD-10-CM | POA: Diagnosis not present

## 2022-12-25 DIAGNOSIS — R0603 Acute respiratory distress: Secondary | ICD-10-CM | POA: Diagnosis not present

## 2022-12-25 DIAGNOSIS — I959 Hypotension, unspecified: Secondary | ICD-10-CM | POA: Diagnosis not present

## 2022-12-25 DIAGNOSIS — Z7982 Long term (current) use of aspirin: Secondary | ICD-10-CM | POA: Diagnosis not present

## 2022-12-25 DIAGNOSIS — J810 Acute pulmonary edema: Secondary | ICD-10-CM | POA: Diagnosis not present

## 2022-12-25 DIAGNOSIS — I2510 Atherosclerotic heart disease of native coronary artery without angina pectoris: Secondary | ICD-10-CM | POA: Diagnosis not present

## 2022-12-25 DIAGNOSIS — R0902 Hypoxemia: Secondary | ICD-10-CM | POA: Diagnosis not present

## 2022-12-25 DIAGNOSIS — I469 Cardiac arrest, cause unspecified: Secondary | ICD-10-CM | POA: Insufficient documentation

## 2022-12-25 DIAGNOSIS — R0602 Shortness of breath: Secondary | ICD-10-CM | POA: Diagnosis not present

## 2022-12-25 LAB — ACID FAST SMEAR (AFB, MYCOBACTERIA): Acid Fast Smear: NEGATIVE

## 2022-12-25 MED ORDER — ETOMIDATE 2 MG/ML IV SOLN
INTRAVENOUS | Status: AC
  Filled 2022-12-25: qty 10

## 2022-12-25 MED ORDER — METHYLPREDNISOLONE SODIUM SUCC 125 MG IJ SOLR
125.0000 mg | Freq: Once | INTRAMUSCULAR | Status: AC
  Administered 2022-12-25: 125 mg via INTRAVENOUS
  Filled 2022-12-25: qty 2

## 2022-12-25 MED ORDER — EPINEPHRINE 1 MG/10ML IJ SOSY
PREFILLED_SYRINGE | INTRAMUSCULAR | Status: AC
  Filled 2022-12-25: qty 50

## 2022-12-25 MED ORDER — ROCURONIUM BROMIDE 10 MG/ML (PF) SYRINGE
PREFILLED_SYRINGE | INTRAVENOUS | Status: AC
  Filled 2022-12-25: qty 10

## 2022-12-25 MED ORDER — NOREPINEPHRINE 4 MG/250ML-% IV SOLN
0.0000 ug/min | INTRAVENOUS | Status: DC
  Administered 2022-12-25: 2 ug/min via INTRAVENOUS

## 2022-12-25 MED ORDER — NOREPINEPHRINE 4 MG/250ML-% IV SOLN
INTRAVENOUS | Status: AC
  Filled 2022-12-25: qty 250

## 2022-12-25 MED ORDER — ALBUTEROL SULFATE (2.5 MG/3ML) 0.083% IN NEBU: 5.0000 mg | INHALATION_SOLUTION | Freq: Once | RESPIRATORY_TRACT | Status: DC

## 2022-12-26 ENCOUNTER — Telehealth: Payer: Self-pay | Admitting: Emergency Medicine

## 2022-12-26 ENCOUNTER — Telehealth (HOSPITAL_BASED_OUTPATIENT_CLINIC_OR_DEPARTMENT_OTHER): Payer: Self-pay | Admitting: Pulmonary Disease

## 2022-12-26 LAB — CULTURE, BAL-QUANTITATIVE W GRAM STAIN: Culture: NO GROWTH

## 2022-12-26 LAB — CYTOLOGY - NON PAP

## 2022-12-26 NOTE — Telephone Encounter (Signed)
Reviewed cytology results with the pt's son Jenny Reichmann. Explained that his RUL and RLL biopsies show squamous cell lung CA. Asked him to contact me for any questions going forward.

## 2022-12-26 NOTE — Telephone Encounter (Signed)
I contacted patient's son, Jenny Reichmann, to offer my condolences. He expressed appreciation for the call and our team's care for his father. He plans to do more research on his father's diagnosis of squamous cell carcinoma of the lung but he comments that he believes that his father may have avoided a long medical course and believes that this may have been the best outcome for his father's situation with his chronic medical conditions.

## 2022-12-26 NOTE — Telephone Encounter (Signed)
Called the patient's son to review nodule cytology. Left message and will call him back. The RUL and RLL nodules both show squamous cell lung CA. The LUL bx's were non-diagnostic.

## 2022-12-26 NOTE — Telephone Encounter (Signed)
I called and spoke with the patient's son Dontreal Taute to pass on my condolences on the passing of his father on 2023-01-10.  Discussed his status, his procedure from 3/18.  He and family are still interested in knowing the cytology results from his bronchoscopy.  I will certainly pass those along to him when they become available.

## 2022-12-29 LAB — AEROBIC/ANAEROBIC CULTURE W GRAM STAIN (SURGICAL/DEEP WOUND): Culture: NO GROWTH

## 2022-12-31 ENCOUNTER — Ambulatory Visit: Payer: Medicare Other | Admitting: Hematology

## 2023-01-01 ENCOUNTER — Ambulatory Visit (HOSPITAL_BASED_OUTPATIENT_CLINIC_OR_DEPARTMENT_OTHER): Payer: Medicare Other | Admitting: Pulmonary Disease

## 2023-01-07 NOTE — ED Notes (Signed)
[facility] was called out to pt home for complaints of chest pain and shortness of breath.  EMS reported pt began to show signs of respiratory distress on the truck.  EMS placed pt on CPAP.  On Pt arrival, respiratory placed pt on BIPAP at 100%.  Care resumed, lab and RN were unable to obtain any blood for lab work. PT continued to distress and BP began to drop.  Levo was started and intubation began.  At 1638 pt went into cardiac arrest and CPR began. Epi given at 1638, pulse check at 1641 ( no pulse). CRP 1641, Pulse check at 1643 (no pulse) Epi 1643, CPR 1643, pulse check 1644 (no pulse) 1G calcium 1645, pulse check  1646 (no pulse) CPR 1747, Levo bumped up to 30 1647, pulse at 1649, Epi 1649, CPR 1649, pulse check 1651 (PEA) CPR 1651, pulse check 1653 (Vfib) shock 120J, MAG complete 1656, CPR 1653, Pulse 1655 (Vfib) shock (120j) CPR 1655, pulse 1657, shock 1657 (200j) [id number], pulse 1701 (vfib) shock (200j) 1702 epi 1702, pulse check 1704 (no pulse) Epi 1704, pulse check 1706 (no pulse) Family in room 1707 TOD 1708.

## 2023-01-07 NOTE — Progress Notes (Signed)
Referred to patient bedside through [facility] alert. Arrived to find ED staff providing care to patient and conducting CPR. Provided presence and support to staff ongoing throughout this encounter.  Identified that two sons were present in the lobby and brought them to the family room. Engaged the sons in reflection around their fathers' wishes and what he would/would not want at end of life or long-term outcomes. Both sons shared that if his quality of life would be compromised then he would not want to live if he couldn't do so meaningfully. Chaplain affirmed this and shared this with ED physician. He spoke with the sons and confirmed that they do not want to continue CPR. Chaplain provided support, prayer with patient as he took his last breaths, and also closing prayer bedside with family. Family coping well given the passing of their father. Chaplain will continue to remain present in order to provide spiritual support and to assess for spiritual need.   Rev. Bennie Pierini, M.Div. Chaplain  [phone]

## 2023-01-07 NOTE — ED Provider Notes (Signed)
[facility] Provider Note   CSN: [id number] Arrival date & time: 12-29-22  1537     History  Chief Complaint  Patient presents with   Respiratory Distress    Jerry Roach is a 77 y.o. male.  HPI Patient presents for respiratory distress.  Medical history includes CAD, HLD, aortic stenosis, arthritis.  Initial history is provided by EMS.  Patient has reportedly had intermittent shortness of breath over the past 2 days.  He is not on oxygen at baseline.  When they arrived on scene, SpO2 was 83% on room air.  Patient had increased work of breathing.  He was placed on 15 L of supplemental oxygen by nonrebreather.  This initially improved his SpO2 to the high 90s.  He continued to have increased work of breathing and CPAP was initiated.  Other vital signs were notable for soft blood pressures.  Bolus of IV fluids was initiated.  History per sons: Patient has had fatigue and generalized weakness over the past 2 weeks.  He has also had intermittent chest pains over this time.  Chest pains worsened today.  He underwent pulmonary procedure yesterday.    Home Medications Prior to Admission medications   Medication Sig Start Date End Date Taking? Authorizing Provider  acetaminophen (TYLENOL) 325 MG tablet Take 2 tablets (650 mg total) by mouth every 6 (six) hours as needed for mild pain or headache. 12/29/17   Erlene Quan, PA-C  alfuzosin (UROXATRAL) 10 MG 24 hr tablet Take 1 tablet (10 mg total) by mouth daily with breakfast. 12/27/21   Cleon Gustin, MD  aspirin EC 81 MG EC tablet Take 1 tablet (81 mg total) by mouth daily. 12/30/17   Erlene Quan, PA-C  atorvastatin (LIPITOR) 80 MG tablet TAKE 1 TABLET(80 MG) BY MOUTH DAILY AT 6 PM 12/21/22   Branch, Alphonse Guild, MD  Carboxymethylcellulose Sodium (EYE DROPS OP) Place 1 drop into both eyes daily as needed (gummy eyes).    [provider]  Flaxseed, Linseed, (FLAX SEED OIL PO) Take  1,200 mg by mouth daily.    [provider]  hydrocortisone (ANUSOL-HC) 25 MG suppository Place 25 mg rectally 2 (two) times daily as needed (Help use the bathroom). 10/22/22   [provider]  isosorbide mononitrate (IMDUR) 30 MG 24 hr tablet Take 1 tablet (30 mg total) by mouth at bedtime. 12/21/22   Early Osmond, MD  metoprolol succinate (TOPROL XL) 25 MG 24 hr tablet Take 0.5 tablets (12.5 mg total) by mouth at bedtime. 12/21/22   Early Osmond, MD  Multiple Vitamins-Minerals (MULTIVITAMIN WITH MINERALS) tablet Take 1 tablet by mouth daily. Centrum silver    [provider]  nitroGLYCERIN (NITROSTAT) 0.4 MG SL tablet Place 1 tablet (0.4 mg total) under the tongue every 5 (five) minutes x 3 doses as needed for chest pain. 12/24/22   Collene Gobble, MD  oxymetazoline (AFRIN) 0.05 % nasal spray Place 1 spray into both nostrils daily as needed for congestion.    [provider]      Allergies    Patient has no known allergies.    Review of Systems   Review of Systems  Unable to perform ROS: Severe respiratory distress    Physical Exam Updated Vital Signs There were no vitals taken for this visit. Physical Exam Vitals and nursing note reviewed.  Constitutional:      General: He is in acute distress.  Appearance: He is well-developed. He is ill-appearing. He is not toxic-appearing or diaphoretic.  HENT:     Head: Normocephalic and atraumatic.     Right Ear: External ear normal.     Left Ear: External ear normal.     Nose: Nose normal.     Mouth/Throat:     Mouth: Mucous membranes are moist.  Eyes:     Extraocular Movements: Extraocular movements intact.     Conjunctiva/sclera: Conjunctivae normal.  Cardiovascular:     Rate and Rhythm: Normal rate and regular rhythm.     Heart sounds: No murmur heard. Pulmonary:     Effort: Tachypnea, accessory muscle usage and respiratory distress present.     Breath sounds: Decreased air movement  present. Wheezing present.     Comments: Arrives on CPAP Abdominal:     General: There is no distension.     Palpations: Abdomen is soft.     Tenderness: There is no abdominal tenderness.  Musculoskeletal:        General: No swelling.     Cervical back: Normal range of motion and neck supple.     Right lower leg: Edema present.     Left lower leg: Edema present.  Skin:    General: Skin is warm and dry.     Coloration: Skin is pale. Skin is not jaundiced.  Neurological:     General: No focal deficit present.     Mental Status: He is alert.  Psychiatric:        Mood and Affect: Mood is anxious.     ED Results / Procedures / Treatments   Labs (all labs ordered are listed, but only abnormal results are displayed) Labs Reviewed  RESP PANEL BY RT-PCR (RSV, FLU A&B, COVID)  [id number]    EKG None  Radiology DG Chest Port 1 View  Result Date: 01/03/23 CLINICAL DATA:  C/O CP and SOB. Pt was maintaining until on the ride here with ems and became altered with hypoxia. EMS reports hypotension. EMS reports pt has no history of CHF or COPD. EXAM: PORTABLE CHEST - 1 VIEW COMPARISON:  the previous day's study FINDINGS: Some interval worsening of interstitial edema with scattered airspace opacities more conspicuous in the lung bases. Probable small left pleural effusion. Metallic markers in the right upper lung in the region of poorly marginated consolidation, and in the left suprahilar region. Heart size and mediastinal contours are within normal limits. Visualized bones unremarkable. IMPRESSION: Worsening interstitial and airspace disease suggesting edema or pneumonia. Electronically Signed   By: Lucrezia Europe M.D.   On: Jan 03, 2023 16:45   DG Chest Port 1 View  Result Date: 12/24/2022 CLINICAL DATA:  Status post bronchoscopic biopsy EXAM: PORTABLE CHEST 1 VIEW COMPARISON:  CT chest done on 12/20/2022 FINDINGS: Transverse diameter of heart is increased. Central pulmonary vessels are prominent.  Increased interstitial markings are seen in both lungs, more so in parahilar regions and lower lung fields. Small to moderate bilateral pleural effusions are seen. There is no pneumothorax. There is a fiduciary marker at the site of biopsy in right upper lung field. There is also a fiduciary marker in left parahilar region. IMPRESSION: There is no pneumothorax. There are small metallic clips in right upper lung fields and left parahilar region, possibly fiduciary markers placed at the time of biopsy. Central pulmonary vessels are prominent suggesting CHF. Small to moderate bilateral pleural effusions. Electronically Signed   By: Elmer Picker M.D.   On: 12/24/2022 11:18   DG  C-ARM BRONCHOSCOPY  Result Date: 12/24/2022 C-ARM BRONCHOSCOPY: Fluoroscopy was utilized by the requesting physician.  No radiographic interpretation.    Procedures Procedure Name: Intubation Date/Time: 01-16-23 5:33 PM  Performed by: Godfrey Pick, MDPre-anesthesia Checklist: Suction available, Patient being monitored and Patient identified Oxygen Delivery Method: Ambu bag Preoxygenation: Pre-oxygenation with 100% oxygen Ventilation: Mask ventilation without difficulty Laryngoscope Size: Glidescope and 4 Grade View: Grade II Tube size: 7.5 mm Number of attempts: 1 Airway Equipment and Method: Rigid stylet and Video-laryngoscopy Secured at: 25 cm Tube secured with: ETT holder Dental Injury: Teeth and Oropharynx as per pre-operative assessment     CPR  Date/Time: 2023-01-16 5:35 PM  Performed by: Godfrey Pick, MD Authorized by: Godfrey Pick, MD  CPR Procedure Details:      Amount of time prior to administration of ACLS/BLS (minutes):  0   ACLS/BLS initiated by EMS: No     CPR/ACLS performed in the ED: Yes     Duration of CPR (minutes):  35   Outcome: Pt declared dead    CPR performed via ACLS guidelines under my direct supervision.  See RN documentation for details including defibrillator use, medications,  doses and timing.     Medications Ordered in ED Medications  albuterol (PROVENTIL) (2.5 MG/3ML) 0.083% nebulizer solution 5 mg (5 mg Nebulization Not Given 2023-01-16 1723)  norepinephrine (LEVOPHED) 4mg  in 264mL (0.016 mg/mL) premix infusion (2 mcg/min Intravenous New Bag/Given 2023/01/16 1615)  rocuronium bromide 100 MG/10ML SOSY (has no administration in time range)  etomidate (AMIDATE) 2 MG/ML injection (has no administration in time range)  EPINEPHrine (ADRENALIN) 1 MG/10ML injection (has no administration in time range)  methylPREDNISolone sodium succinate (SOLU-MEDROL) 125 mg/2 mL injection 125 mg (125 mg Intravenous Given 01/16/23 1606)    ED Course/ Medical Decision Making/ A&P                             Medical Decision Making Amount and/or Complexity of Data Reviewed Labs: ordered. Radiology: ordered.  Risk Prescription drug management.   This patient presents to the ED for concern of shortness of breath, this involves an extensive number of treatment options, and is a complaint that carries with it a high risk of complications and morbidity.  The differential diagnosis includes ACS, pulmonary edema, pneumonia, reactive airway disease, without dissection, pericardial effusion, valvular abnormality, metabolic derangements   Co morbidities that complicate the patient evaluation  CAD, HLD, aortic stenosis, arthritis.    Additional history obtained:  Additional history obtained from EMS, patient's sons External records from outside source obtained and reviewed including EMR   Imaging Studies ordered:  I ordered imaging studies including chest x-ray I independently visualized and interpreted imaging which showed when compared to yesterday, worsened interstitial and airspace disease consistent with edema and/or pneumonia I agree with the radiologist interpretation   Cardiac Monitoring: / EKG:  The patient was maintained on a cardiac monitor.  I personally viewed and  interpreted the cardiac monitored which showed an underlying rhythm of: Indeterminate rhythm   Consultations Obtained:  I requested consultation with the examiner,  and discussed lab and imaging findings as well as pertinent plan - they recommend: Not an ME case.  Death by natural cause.   Problem List / ED Course / Critical interventions / Medication management  Patient presents for respiratory distress.  Per EMS, he has had intermittent shortness of breath over the past 2 days.  He has a history of CAD.  He arrives on CPAP.  He is awake and alert.  He does continue to have increased work of breathing and tachypnea.  On lung auscultation, he has diminished breath sounds and wheezing present.  BLE edema is noted on exam.  Patient was placed on BiPAP.  Albuterol was ordered.  Diagnostic workup was initiated.  Per chart review, patient underwent coronary CT on 2/23.  There were findings of bilateral pulmonary masses.  Yesterday, patient underwent bronchoscopy with EBUS.  Bilateral breath sounds are present at this time.  In addition to shortness of breath, patient endorses pain "all over".  He does have abdominal tenderness.  He was noted to be hypotensive.  Levophed was initiated.  Chest x-ray shows worsened interstitial and airspace disease when compared to yesterday.  Despite BiPAP, patient continued have increased work of breathing.  He remained pale.  Despite Levophed, he had worsening blood pressures.  Levophed was increased to 20 mcg/min.  Despite this, blood pressures worsened and he ultimately lost pulses.  CPR was initiated.  Patient underwent over 30 minutes of CPR.  During this time, he received multiple doses of 1 mg epinephrine.  He received 1 g of calcium gluconate.  Initial rhythm was PEA.  He subsequently had wide-complex tachycardia.  He did receive fibrillatory shock multiple times.  During 1 pulse check, he was noted to have polymorphic V. tach.  Shock was administered again and patient  was given 2mg  of magnesium.  After 30 minutes of CPR, his sons were present in consult room.  I went and spoke with them.  They were able to provide additional history.  Patient underwent an echocardiogram in January and was identified to have severe aortic stenosis.  He was being worked up by cardiology for possible valve replacement.  He underwent a recent coronary CT which did show pulmonary nodules.  Prior to any cardiac procedures, they did want to get him cleared from a pulmonary standpoint.  It was for this reason that he underwent the bronchoscopy yesterday.  Patient's sons report that he has been feeling very unwell for the past 2 weeks.  He has had ongoing intermittent chest pains and shortness of breath.  These worsened today.  Patient signs expressed understanding of poor outcomes after 30 minutes of CPR with no ROSC.  They were brought back to the room while CPR was ongoing.  Sons requested that we discontinue resuscitative efforts.  I feel that this is appropriate.  Time of death was 67.  Given the recent procedure, I did reach out to the medical examiner.  Medical examiner declined case. I ordered medication including Levophed for hypotension; albuterol Medrol for shortness of breath with wheezing; epinephrine for cardiac arrest; calcium gluconate for wide-complex PEA, magnesium sulfate for polymorphic V. tach Reevaluation of the patient after these medicines showed that the patient worsened I have reviewed the patients home medicines and have made adjustments as needed   Social Determinants of Health:  Was living independently   CRITICAL CARE Performed by: Godfrey Pick   Total critical care time: 35 minutes  Critical care time was exclusive of separately billable procedures and treating other patients.  Critical care was necessary to treat or prevent imminent or life-threatening deterioration.  Critical care was time spent personally by me on the following activities: development  of treatment plan with patient and/or surrogate as well as nursing, discussions with consultants, evaluation of patient's response to treatment, examination of patient, obtaining history from patient or surrogate, ordering and performing treatments  and interventions, ordering and review of laboratory studies, ordering and review of radiographic studies, pulse oximetry and re-evaluation of patient's condition.         Final Clinical Impression(s) / ED Diagnoses Final diagnoses:  Respiratory distress  Acute pulmonary edema ([place])  Cardiac arrest [facility])    Rx / DC Orders ED Discharge Orders     None         Godfrey Pick, MD 23-Jan-2023 1751

## 2023-01-07 NOTE — ED Triage Notes (Signed)
C/O CP and SOB. Pt was maintaining until on the ride here with ems and became altered with hypoxia. EMS reports hypotension. EMS reports pt has no history of CHF or COPD

## 2023-01-07 DEATH — deceased

## 2023-01-11 ENCOUNTER — Ambulatory Visit: Payer: Medicare Other | Admitting: Urology

## 2023-01-14 ENCOUNTER — Ambulatory Visit: Payer: Medicare Other | Admitting: Internal Medicine

## 2023-01-24 NOTE — Progress Notes (Signed)
This encounter was created in error - please disregard.

## 2023-01-28 ENCOUNTER — Ambulatory Visit: Payer: Medicare Other | Admitting: Internal Medicine

## 2023-01-30 LAB — FUNGUS CULTURE WITH STAIN

## 2023-01-30 LAB — FUNGAL ORGANISM REFLEX

## 2023-01-30 LAB — FUNGUS CULTURE RESULT

## 2023-02-06 ENCOUNTER — Ambulatory Visit: Payer: Medicare Other | Admitting: Urology

## 2023-02-07 LAB — ACID FAST CULTURE WITH REFLEXED SENSITIVITIES (MYCOBACTERIA): Acid Fast Culture: NEGATIVE

## 2023-03-15 ENCOUNTER — Other Ambulatory Visit: Payer: Medicare Other

## 2023-03-21 ENCOUNTER — Ambulatory Visit: Payer: Medicare Other | Admitting: Physician Assistant

## 2023-03-22 ENCOUNTER — Ambulatory Visit: Payer: Medicare Other | Admitting: Physician Assistant

## 2023-04-22 ENCOUNTER — Ambulatory Visit: Payer: Medicare Other | Admitting: Cardiology
